# Patient Record
Sex: Female | Born: 1950
Health system: Southern US, Community
[De-identification: ages and names within clinical notes are randomized; demographics above are authoritative.]

## PROBLEM LIST (undated history)

## (undated) DIAGNOSIS — Z923 Personal history of irradiation: Secondary | ICD-10-CM

## (undated) DIAGNOSIS — K5792 Diverticulitis of intestine, part unspecified, without perforation or abscess without bleeding: Secondary | ICD-10-CM

## (undated) DIAGNOSIS — G4730 Sleep apnea, unspecified: Secondary | ICD-10-CM

## (undated) DIAGNOSIS — E213 Hyperparathyroidism, unspecified: Secondary | ICD-10-CM

## (undated) DIAGNOSIS — Z800 Family history of malignant neoplasm of digestive organs: Secondary | ICD-10-CM

## (undated) DIAGNOSIS — E785 Hyperlipidemia, unspecified: Secondary | ICD-10-CM

## (undated) DIAGNOSIS — C4492 Squamous cell carcinoma of skin, unspecified: Secondary | ICD-10-CM

## (undated) DIAGNOSIS — I10 Essential (primary) hypertension: Secondary | ICD-10-CM

## (undated) DIAGNOSIS — R7303 Prediabetes: Secondary | ICD-10-CM

## (undated) DIAGNOSIS — Z8489 Family history of other specified conditions: Secondary | ICD-10-CM

## (undated) DIAGNOSIS — M2550 Pain in unspecified joint: Secondary | ICD-10-CM

## (undated) DIAGNOSIS — K5900 Constipation, unspecified: Secondary | ICD-10-CM

## (undated) DIAGNOSIS — R002 Palpitations: Secondary | ICD-10-CM

## (undated) DIAGNOSIS — Z8052 Family history of malignant neoplasm of bladder: Secondary | ICD-10-CM

## (undated) DIAGNOSIS — J45909 Unspecified asthma, uncomplicated: Secondary | ICD-10-CM

## (undated) DIAGNOSIS — C50919 Malignant neoplasm of unspecified site of unspecified female breast: Secondary | ICD-10-CM

## (undated) DIAGNOSIS — T884XXA Failed or difficult intubation, initial encounter: Secondary | ICD-10-CM

## (undated) DIAGNOSIS — E079 Disorder of thyroid, unspecified: Secondary | ICD-10-CM

## (undated) DIAGNOSIS — K635 Polyp of colon: Secondary | ICD-10-CM

## (undated) DIAGNOSIS — M549 Dorsalgia, unspecified: Secondary | ICD-10-CM

## (undated) DIAGNOSIS — E559 Vitamin D deficiency, unspecified: Secondary | ICD-10-CM

## (undated) DIAGNOSIS — Z87891 Personal history of nicotine dependence: Secondary | ICD-10-CM

## (undated) DIAGNOSIS — Z803 Family history of malignant neoplasm of breast: Secondary | ICD-10-CM

## (undated) DIAGNOSIS — M7989 Other specified soft tissue disorders: Secondary | ICD-10-CM

## (undated) DIAGNOSIS — E538 Deficiency of other specified B group vitamins: Secondary | ICD-10-CM

## (undated) DIAGNOSIS — M1990 Unspecified osteoarthritis, unspecified site: Secondary | ICD-10-CM

## (undated) DIAGNOSIS — F1010 Alcohol abuse, uncomplicated: Secondary | ICD-10-CM

## (undated) DIAGNOSIS — Z808 Family history of malignant neoplasm of other organs or systems: Secondary | ICD-10-CM

## (undated) DIAGNOSIS — F329 Major depressive disorder, single episode, unspecified: Secondary | ICD-10-CM

## (undated) DIAGNOSIS — E042 Nontoxic multinodular goiter: Secondary | ICD-10-CM

## (undated) DIAGNOSIS — Z872 Personal history of diseases of the skin and subcutaneous tissue: Secondary | ICD-10-CM

## (undated) DIAGNOSIS — R0602 Shortness of breath: Secondary | ICD-10-CM

## (undated) DIAGNOSIS — E119 Type 2 diabetes mellitus without complications: Secondary | ICD-10-CM

## (undated) DIAGNOSIS — F32A Depression, unspecified: Secondary | ICD-10-CM

## (undated) DIAGNOSIS — F40240 Claustrophobia: Secondary | ICD-10-CM

## (undated) DIAGNOSIS — C4491 Basal cell carcinoma of skin, unspecified: Secondary | ICD-10-CM

## (undated) DIAGNOSIS — J449 Chronic obstructive pulmonary disease, unspecified: Secondary | ICD-10-CM

## (undated) DIAGNOSIS — F419 Anxiety disorder, unspecified: Secondary | ICD-10-CM

## (undated) HISTORY — PX: TONSILLECTOMY AND ADENOIDECTOMY: SUR1326

## (undated) HISTORY — DX: Sleep apnea, unspecified: G47.30

## (undated) HISTORY — DX: Major depressive disorder, single episode, unspecified: F32.9

## (undated) HISTORY — DX: Family history of malignant neoplasm of digestive organs: Z80.0

## (undated) HISTORY — PX: CHOLECYSTECTOMY: SHX55

## (undated) HISTORY — DX: Shortness of breath: R06.02

## (undated) HISTORY — DX: Palpitations: R00.2

## (undated) HISTORY — DX: Family history of malignant neoplasm of other organs or systems: Z80.8

## (undated) HISTORY — DX: Family history of malignant neoplasm of bladder: Z80.52

## (undated) HISTORY — DX: Personal history of irradiation: Z92.3

## (undated) HISTORY — DX: Basal cell carcinoma of skin, unspecified: C44.91

## (undated) HISTORY — PX: CARPAL TUNNEL RELEASE: SHX101

## (undated) HISTORY — DX: Personal history of diseases of the skin and subcutaneous tissue: Z87.2

## (undated) HISTORY — DX: Hyperparathyroidism, unspecified: E21.3

## (undated) HISTORY — DX: Malignant neoplasm of unspecified site of unspecified female breast: C50.919

## (undated) HISTORY — DX: Squamous cell carcinoma of skin, unspecified: C44.92

## (undated) HISTORY — DX: Family history of malignant neoplasm of breast: Z80.3

## (undated) HISTORY — DX: Polyp of colon: K63.5

## (undated) HISTORY — DX: Other specified soft tissue disorders: M79.89

## (undated) HISTORY — DX: Disorder of thyroid, unspecified: E07.9

## (undated) HISTORY — DX: Dorsalgia, unspecified: M54.9

## (undated) HISTORY — DX: Deficiency of other specified B group vitamins: E53.8

## (undated) HISTORY — DX: Depression, unspecified: F32.A

## (undated) HISTORY — DX: Constipation, unspecified: K59.00

## (undated) HISTORY — DX: Pain in unspecified joint: M25.50

## (undated) HISTORY — PX: TUBAL LIGATION: SHX77

## (undated) HISTORY — DX: Vitamin D deficiency, unspecified: E55.9

## (undated) HISTORY — DX: Alcohol abuse, uncomplicated: F10.10

---

## 2000-05-18 ENCOUNTER — Encounter (INDEPENDENT_AMBULATORY_CARE_PROVIDER_SITE_OTHER): Payer: Self-pay

## 2000-05-18 ENCOUNTER — Other Ambulatory Visit: Admission: RE | Admit: 2000-05-18 | Discharge: 2000-05-18 | Payer: Self-pay | Admitting: *Deleted

## 2001-03-27 ENCOUNTER — Other Ambulatory Visit: Admission: RE | Admit: 2001-03-27 | Discharge: 2001-03-27 | Payer: Self-pay | Admitting: Obstetrics and Gynecology

## 2001-05-25 ENCOUNTER — Encounter: Payer: Self-pay | Admitting: Urology

## 2001-05-25 ENCOUNTER — Encounter: Admission: RE | Admit: 2001-05-25 | Discharge: 2001-05-25 | Payer: Self-pay | Admitting: Urology

## 2002-04-16 ENCOUNTER — Other Ambulatory Visit: Admission: RE | Admit: 2002-04-16 | Discharge: 2002-04-16 | Payer: Self-pay | Admitting: Obstetrics and Gynecology

## 2002-05-18 ENCOUNTER — Encounter (INDEPENDENT_AMBULATORY_CARE_PROVIDER_SITE_OTHER): Payer: Self-pay

## 2002-05-18 ENCOUNTER — Ambulatory Visit (HOSPITAL_COMMUNITY): Admission: RE | Admit: 2002-05-18 | Discharge: 2002-05-18 | Payer: Self-pay | Admitting: Obstetrics and Gynecology

## 2003-05-29 ENCOUNTER — Encounter
Admission: RE | Admit: 2003-05-29 | Discharge: 2003-05-29 | Payer: Self-pay | Admitting: Physical Medicine and Rehabilitation

## 2003-07-24 ENCOUNTER — Other Ambulatory Visit: Admission: RE | Admit: 2003-07-24 | Discharge: 2003-07-24 | Payer: Self-pay | Admitting: Obstetrics and Gynecology

## 2003-12-26 ENCOUNTER — Encounter: Admission: RE | Admit: 2003-12-26 | Discharge: 2003-12-26 | Payer: Self-pay | Admitting: Internal Medicine

## 2004-01-06 ENCOUNTER — Encounter (HOSPITAL_COMMUNITY): Admission: RE | Admit: 2004-01-06 | Discharge: 2004-02-27 | Payer: Self-pay | Admitting: Internal Medicine

## 2004-06-29 ENCOUNTER — Ambulatory Visit (HOSPITAL_COMMUNITY): Admission: RE | Admit: 2004-06-29 | Discharge: 2004-06-29 | Payer: Self-pay | Admitting: Endocrinology

## 2005-03-11 ENCOUNTER — Ambulatory Visit (HOSPITAL_COMMUNITY): Admission: RE | Admit: 2005-03-11 | Discharge: 2005-03-11 | Payer: Self-pay | Admitting: Endocrinology

## 2005-03-22 ENCOUNTER — Encounter (INDEPENDENT_AMBULATORY_CARE_PROVIDER_SITE_OTHER): Payer: Self-pay | Admitting: *Deleted

## 2005-03-22 ENCOUNTER — Encounter: Admission: RE | Admit: 2005-03-22 | Discharge: 2005-03-22 | Payer: Self-pay | Admitting: Endocrinology

## 2005-03-22 ENCOUNTER — Other Ambulatory Visit: Admission: RE | Admit: 2005-03-22 | Discharge: 2005-03-22 | Payer: Self-pay | Admitting: Interventional Radiology

## 2005-08-13 ENCOUNTER — Encounter: Admission: RE | Admit: 2005-08-13 | Discharge: 2005-08-13 | Payer: Self-pay | Admitting: Endocrinology

## 2005-12-02 ENCOUNTER — Ambulatory Visit: Payer: Self-pay | Admitting: Internal Medicine

## 2007-01-20 ENCOUNTER — Encounter: Admission: RE | Admit: 2007-01-20 | Discharge: 2007-01-20 | Payer: Self-pay | Admitting: Endocrinology

## 2007-03-06 ENCOUNTER — Ambulatory Visit: Payer: Self-pay | Admitting: Vascular Surgery

## 2007-11-13 ENCOUNTER — Other Ambulatory Visit: Admission: RE | Admit: 2007-11-13 | Discharge: 2007-11-13 | Payer: Self-pay | Admitting: Family Medicine

## 2008-05-20 ENCOUNTER — Encounter: Admission: RE | Admit: 2008-05-20 | Discharge: 2008-05-20 | Payer: Self-pay | Admitting: Internal Medicine

## 2009-01-01 ENCOUNTER — Other Ambulatory Visit: Admission: RE | Admit: 2009-01-01 | Discharge: 2009-01-01 | Payer: Self-pay | Admitting: Family Medicine

## 2010-01-12 ENCOUNTER — Other Ambulatory Visit
Admission: RE | Admit: 2010-01-12 | Discharge: 2010-01-12 | Payer: Self-pay | Source: Home / Self Care | Admitting: Family Medicine

## 2010-03-21 ENCOUNTER — Encounter: Payer: Self-pay | Admitting: Internal Medicine

## 2010-03-21 ENCOUNTER — Encounter: Payer: Self-pay | Admitting: Endocrinology

## 2010-03-22 ENCOUNTER — Encounter: Payer: Self-pay | Admitting: Endocrinology

## 2010-07-14 NOTE — Consult Note (Signed)
VASCULAR SURGERY CONSULTATION   Tammy, Mccoy  DOB:  1950-03-23                                       03/06/2007  EAVWU#:[id number]   This is a vascular surgery consultation.   The patient is a 60 year old female patient who was referred through the  courtesy of Dr. Aram Beecham white, for consultation regarding venous  insufficiency of the left lower extremity.  She states that she has been  having increasing prominent varicosities in the left leg, particularly  in the lower thigh and knee area in the pretibial region over the last  10 years, and particularly over the last few years, has had increasing  aching, throbbing, and burning discomfort in the left leg.  This is made  worse by walking, and is partially relieved by bedrest and elevation.  She has noticed no swelling or ulceration, and has had no history of  thrombophlebitis or deep venous thrombosis.  She has had no bleeding  from the varicosities.  She has not worn elastic compression stockings,  but does have improvement with elevation as noted.  She is allergic to  ibuprofen, so has not tried that medication, but does take Tylenol which  improves her symptoms.  Currently, however, her daily is being effected  by these symptoms because of the ongoing discomfort.   PAST MEDICAL HISTORY:  Negative for diabetes, hypertension, coronary  artery disease, COPD and stroke.   PREVIOUS SURGERY:  Cesarean section.   FAMILY HISTORY:  Positive for diabetes in a grandmother.  Negative  coronary artery disease and stroke.   SOCIAL HISTORY:  She is married and has 2 children.  She does smoke a  pack of cigarettes per day.  Does not use alcohol on a regular basis.   REVIEW OF SYSTEMS/MEDICATIONS:  Please see health history form.   ALLERGIES:  Ibuprofen.   PHYSICAL EXAMINATION:  Blood pressure is 124/80, heart rate is 81,  respirations are 18.  Generally, she is a middle-age obese female who is  in no apparent  distress.  She is alert and oriented x3.  Her neck is  supple.  3+ carotid pulse is palpable.  No bruits are audible.  Her  neurologic exam is normal.  No palpable adenopathy in the neck.  CHEST:  Clear to auscultation.  CARDIOVASCULAR EXAM:  Reveals a regular rhythm  with no murmurs.  No skin rashes are noted.  ABDOMEN:  Soft.  No  palpable mass is noted.  LOWER EXTREMITY EXAM:  Reveals 3+ femoral  popliteal and dorsalis pedis pulses palpable bilaterally.  Right leg is  unremarkable with no significant varicosities, and no distal edema.  Left leg has prominent varicosities in the greater saphenous system,  beginning in the mid thigh and extending down medially to the knee and  just below the knee, and extending over toward the patella.  There are  also moderate varicosities in the left pretibial region.  There are no  posterior varicosities.  There is no hyperpigmentation or ulceration,  and no distal edema.   Venous duplex exam was performed in our office today, which revealed  gross reflux of the left saphenofemoral junction, and throughout the  left greater saphenous vein with the large branch supplying these  varicosities in the mid to distal thigh.  The venous system is widely  patent with no obstruction.   IMPRESSION:  Severe venous insufficiency of the left leg with  symptomatic varicose veins in the left greater saphenous system.   PLAN:  We will begin the patient with elastic compression support today  (20-mm to 30-mm long leg).  She will also try elevation on a more  regular basis, as well as analgesics and weight loss to see if this will  relieve her symptoms.  The varicosities were photographed today.  She  will return in 3 months for further followup, and if no improvement has  occurred, she would be a good candidate for laser ablation of the left  greater saphenous vein with multiple stab phlebectomies.   Tammy Mccoy, M.D.  Electronically Signed  JDL/MEDQ  D:   03/06/2007  T:  03/07/2007  Job:  [id number]   cc:   Stacie Acres. Cliffton Asters, M.D.

## 2010-07-14 NOTE — Procedures (Signed)
LOWER EXTREMITY VENOUS REFLUX EXAM   INDICATION:  Left leg pain and varicose vein.   EXAM:  Using color-flow imaging and pulse Doppler spectral analysis, the  left common femoral vein, superficial femoral, popliteal, posterior  tibial, greater and lesser saphenous veins are evaluated.  There is  evidence suggesting deep venous insufficiency in the left lower  extremity.   The left saphenofemoral junction is not competent.  The left GSV is not  competent with the caliber as described below.   The left proximal short saphenous vein demonstrates competency.   GSV Diameter (used if found to be incompetent only)                                            Right    Left  Proximal Greater Saphenous Vein           cm       0.99 cm  Proximal-to-mid-thigh                     cm       0.99 cm  Mid thigh                                 cm       0.68 cm  Mid-distal thigh                          cm       0.68 cm  Distal thigh                              cm       0.37 cm  Knee                                      cm       0.35 cm   IMPRESSION:  1. Left greater saphenous vein reflux is identified with the caliber      ranging from 0.35 cm to 0.99 cm knee to groin.  2. The left greater saphenous vein is not aneurysmal.  3. The left greater saphenous vein is not tortuous.  4. The deep venous system is not competent.  5. The left lesser saphenous vein is competent.  6. No evidence of deep venous thrombosis noted in the left leg.  7. A perforator measured (0.59) noted at the distal calf.   ___________________________________________  Quita Skye. Hart Rochester, M.D.   MG/MEDQ  D:  03/06/2007  T:  03/07/2007  Job:  [id number]

## 2010-07-17 NOTE — Op Note (Signed)
NAME:  Tammy Mccoy, Tammy Mccoy                    ACCOUNT NO.:  [id number]   MEDICAL RECORD NO.:  [id number]                   PATIENT TYPE:  AMB   LOCATION:  SDC                                  FACILITY:  [facility]   PHYSICIAN:  Maxie Better, M.D.            DATE OF BIRTH:  Jul 08, 1950   DATE OF PROCEDURE:  05/18/2002  DATE OF DISCHARGE:                                 OPERATIVE REPORT   PREOPERATIVE DIAGNOSES:  1. Menorrhagia.  2. Endometrial mass.   POSTOPERATIVE DIAGNOSES:  1. Menorrhagia.  2. Endometrial mass.   PROCEDURE:  Hysteroscopy, resectoscope dilation and curettage.   SURGEON:  Maxie Better, M.D.   ANESTHESIA:  General, paracervical block.   INDICATIONS:  This is a 60 year old gravida 2, para 2 female with a history  of bilateral tubal ligation who was found on evaluation of menorrhagia for  possible endometrial masses and who now presents for surgical management.  Risks and benefits of the procedure have been explained to the patient.  Consent was signed.  The patient was transferred to the operating room.   DESCRIPTION OF PROCEDURE:  Under adequate general anesthesia, the patient  was placed in the dorsal lithotomy position.  Examination under anesthesia  revealed axial to anteverted uterus, top normal size.  No adnexal masses  could be appreciated.  The patient was sterilely prepped and draped in the  usual fashion.  The bladder was catheterized of a small amount of urine.  Bivalve speculum was placed in the vagina.  A single-tooth tenaculum was  placed on the anterior lip of the cervix.  The cervix was initially dilated  up to #25 [facility] dilator.  A diagnostic hysteroscope was then introduced into  the uterine cavity.  Both tubal ostia were eventually seen.  There was  thickening of the posterior endometrial wall, however, no specific defined  endometrial polyp could be seen.  The endocervical canal was inspected, and  the other than suggestion of a  Nabothian cyst, no other lesions were noted.  The diagnostic hysteroscope was removed.  The cervix was then further  dilated up to a #31 [employer] dilator and a resectoscope was introduced.  Again,  the cavity was noted to have both tubal ostia.  There was blood in the  cavity.  The resectoscope was removed.  The cavity was then curetted for a  moderate amount of tissue.  The resectoscope was then reinserted.  A small  raised area was removed suggestive of possible polyp.  No other findings  were noted at which time the resectoscope was removed.  The cavity was once  again curetted.  Due to the fact that the patient has an allergy to  ibuprofen, 20 mL of 1% Nesacaine was injected paracervically and in the  uterosacral ligaments for pain management.  All instruments were then  removed from the vagina.  Specimen labeled endometrial curetting with  question of polyp was sent to pathology.  Estimated blood loss  was minimal.  Fluid deficits were about 200 mL.  Complications were none.  The patient  tolerated the procedure well and was transferred to the recovery room in  stable condition.                                               Maxie Better, M.D.    [employer]/MEDQ  D:  05/18/2002  T:  05/19/2002  Job:  [id number]

## 2010-07-17 NOTE — Assessment & Plan Note (Signed)
[facility]                               PULMONARY OFFICE NOTE   Tammy Mccoy, PAGANELLI            MRN:          [id number]  DATE:12/02/2005                            DOB:          January 25, 1951    SLEEP MEDICINE CONSULTATION   PROBLEM:  [age]-year-old woman seen at the kind request of Dr. Laurann Montana in sleep medicine consultation for sleep-disordered breathing and  excessive daytime sleepiness.   HISTORY:  Her husband has been telling her that she snores loudly and holds  her breath at night.  She falls asleep easily, but wakes in the morning  feeling completely unrested.  She may move or kick a little.  She had a  diagnostic polysomnogram at [facility] on Jul 07, 2005, which recorded mild obstructive apnea with an AHI of 10 per hour,  desaturating to 79% with loud snoring.  She was unable to tolerate an  attempt at split protocol, apparently because of claustrophobia.  A CPAP  titration study was done at the [facility] on August 20, 2005,  with incomplete control at 10 CWP, which was the highest pressure tried.  A  technician note indicates that she only had REM-related events.  She had had  an auto-titration attempt between May 23 and August 22, 2005, suggesting a  target therapeutic pressure of 13 to 14 CWP.  She had been told by Dr. Welton Flakes  that she needed oxygen added to CPAP at 2 liters per minute.  She says sleep  latency is only a few minutes, and she is  aware of waking 2 to 4 times  briefly during the night.  She gets up at 6:30 a.m. and limits caffeine to  morning coffee.  She has been working with sleep med home therapy, has tried  at least 2 different masks in addition to a nasal pillow system.   REVIEW OF SYSTEMS:  She denies seasonal nasal congestion problems, cough,  wheeze or dysphagia. Easy exertional dyspnea she attributes to  deconditioning.  No chest pain or palpitation, fevers or  sweats, confusion  or syncope, ankle edema or leg pain.   MEDICATIONS:  1. Prozac 20 mg.  2. Xanax 0.5 mg p.r.n.  3. Prilosec.  4. CPAP currently at 10 CWP.   ALLERGIES:  Drug intolerance of IBUPROFEN.   PAST HISTORY:  1. Tonsils and adenoids were out as a child.  2. Frequent ear problems in childhood.  3. Cervical disk disease with some spurs.  4. Small thyroid nodules, are being followed.  5. She has not been told of any heart or lung disease.  6. Surgery otherwise for tubal ligation.  7. Two C-sections.   SOCIAL HISTORY:  She is smoking one pack per day, having stopped and then  restarted a year ago.  Her weight went up 30 pounds when she quit the first  time, and she has now joined [employer].  Reports significant social  stresses at home.  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   FAMILY HISTORY:  Mother with emphysema.  Mother, father and sister with  cancers.  OBJECTIVE:  Weight 266 pounds, BP 116/66, pulse regular 84, room air  saturation 95%.  This is an obese, apparently comfortable and pleasant lady.  SKIN:  Clear.  ADENOPATHY:  None at the neck or shoulders.  HEENT:  Thick tongue base and long palate for a spacing 4/4.  Voice quality  is normal with no stridor or thyromegaly, and no neck vein distention.  She  seems able to breathe through her nose with her mouth closed.  CHEST:  Shallow but quiet and clear breath sounds, unlabored at rest.  Heart sounds regular, somewhat muffled, without murmur or gallop.  EXTREMITIES:  Without cyanosis, clubbing, edema, tremor or sweating.   Pulmonary function tests:  PFT dated October 22, 2005, indicates moderate  obstructive airways disease with an FVC of 3.04 (91%), FEV1 of 1.79 (66%),  ratio 0.59.  FEV1 improved 34% to 2.40 (88%) after bronchodilator, which is  a significant response.  Measured lung volumes were normal, with a total  lung capacity of 89%, and diffusion capacity was within normal at  88%.  The  flow volume loop looks significantly boxed on the 2 tracings available,  raising the possibility of a fixed upper airway obstruction.   IMPRESSION:  1. Obstructive sleep apnea, mild, apnea-hypopnea index 10 per hour, with      continuous positive airway pressure intolerance at 10 centimeters of      water pressure, which she says is way too much, blowing her head      open.  2. 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  3. Tobacco abuse.  4. Question possibility of upper airway obstruction, based on limited      numbers of flow volume loops, without stridor.  5. Exogenous obesity.  6. She has home oxygen at 2 liters per minute provided through [employer].  I      am not sure I see sufficient basis in available data to require this on      a long-term basis.   PLAN:  1. Empiric trial of a reduced CPAP to 5 CWP.  She will call to report how      this feels for her.  2. Strongly supported her participation with [employer].  3. Smoking cessation, discussion and prescription of Chantix.  4. Recheck pulmonary function tests, with particular attention to flow      volume loop contour, and based on that we will consider whether she      needs a CT scan.  5. Schedule return in 3 to 4 weeks, with decision then about continuing      home oxygen and      about whether a different assessment of her sleep complaints will be      necessary.  I appreciate the chance to meet her, and would be happy to      discuss her care.       Clinton D. Maple Hudson, MD, FCCP, FACP      CDY/MedQ  DD:  12/02/2005  DT:  12/04/2005  Job #:  [id number]   cc:   Stacie Acres. Cliffton Asters, M.D.  Dorisann Frames, M.D.

## 2010-12-15 ENCOUNTER — Other Ambulatory Visit: Payer: Self-pay | Admitting: Internal Medicine

## 2010-12-15 DIAGNOSIS — E041 Nontoxic single thyroid nodule: Secondary | ICD-10-CM

## 2010-12-23 ENCOUNTER — Other Ambulatory Visit: Payer: Self-pay

## 2010-12-24 ENCOUNTER — Ambulatory Visit
Admission: RE | Admit: 2010-12-24 | Discharge: 2010-12-24 | Disposition: A | Discharge status: Home / Self Care | Payer: PRIVATE HEALTH INSURANCE | Source: Ambulatory Visit | Attending: Internal Medicine | Admitting: Internal Medicine

## 2010-12-24 DIAGNOSIS — E041 Nontoxic single thyroid nodule: Secondary | ICD-10-CM

## 2011-06-09 DIAGNOSIS — E669 Obesity, unspecified: Secondary | ICD-10-CM

## 2011-06-23 DIAGNOSIS — E669 Obesity, unspecified: Secondary | ICD-10-CM

## 2011-06-30 DIAGNOSIS — E669 Obesity, unspecified: Secondary | ICD-10-CM

## 2011-07-07 DIAGNOSIS — E669 Obesity, unspecified: Secondary | ICD-10-CM

## 2011-07-28 DIAGNOSIS — E669 Obesity, unspecified: Secondary | ICD-10-CM

## 2011-08-25 DIAGNOSIS — E669 Obesity, unspecified: Secondary | ICD-10-CM

## 2012-12-19 ENCOUNTER — Other Ambulatory Visit: Payer: Self-pay | Admitting: Family Medicine

## 2012-12-19 ENCOUNTER — Other Ambulatory Visit (HOSPITAL_COMMUNITY)
Admission: RE | Admit: 2012-12-19 | Discharge: 2012-12-19 | Disposition: A | Discharge status: Home / Self Care | Payer: BC Managed Care – PPO | Source: Ambulatory Visit | Attending: Family Medicine | Admitting: Family Medicine

## 2012-12-19 DIAGNOSIS — Z0000 Encounter for general adult medical examination without abnormal findings: Secondary | ICD-10-CM | POA: Insufficient documentation

## 2013-01-02 ENCOUNTER — Other Ambulatory Visit: Payer: Self-pay | Admitting: Internal Medicine

## 2013-01-02 DIAGNOSIS — E049 Nontoxic goiter, unspecified: Secondary | ICD-10-CM

## 2013-01-04 ENCOUNTER — Ambulatory Visit
Admission: RE | Admit: 2013-01-04 | Discharge: 2013-01-04 | Disposition: A | Discharge status: Home / Self Care | Payer: BC Managed Care – PPO | Source: Ambulatory Visit | Attending: Internal Medicine | Admitting: Internal Medicine

## 2013-01-04 DIAGNOSIS — E049 Nontoxic goiter, unspecified: Secondary | ICD-10-CM

## 2013-04-10 ENCOUNTER — Encounter: Payer: Self-pay | Admitting: Cardiology

## 2013-04-10 ENCOUNTER — Ambulatory Visit (INDEPENDENT_AMBULATORY_CARE_PROVIDER_SITE_OTHER): Payer: BC Managed Care – PPO | Admitting: Cardiology

## 2013-04-10 VITALS — BP 140/72 | HR 61 | Ht 65.0 in | Wt 280.8 lb

## 2013-04-10 DIAGNOSIS — R079 Chest pain, unspecified: Secondary | ICD-10-CM

## 2013-04-10 NOTE — Patient Instructions (Signed)
Your physician recommends that you schedule a follow-up appointment in: as needed Your physician has requested that you have an exercise tolerance test. For further information please visit [email or web address]. Please also follow instruction sheet, as given.   

## 2013-04-10 NOTE — Progress Notes (Signed)
HPI The patient presents for evaluation of dyspnea. She has no prior cardiac history. However, she's noticed shortness of breath with some discomfort. She had a sinus problem followed by some soreness in her breastbone. She had a mild cough with this. She described this to Dr. Dema Severin who referred her for further evaluation.  She does get some dyspnea walking a flight of stairs but this resolves fairly quickly. She does not report chest discomfort with this activity. She does not get neck or arm discomfort. She doesn't have any palpitations, presyncope or syncope. She has no PND or orthopnea. She has no new edema.  Allergies  Allergen Reactions  . Ibuprofen     Current Outpatient Prescriptions  Medication Sig Dispense Refill  . ALPRAZolam (XANAX) 0.5 MG tablet Take 0.5 mg by mouth. 1 to 1/2 tablet  At bedtime prn      . amoxicillin (AMOXIL) 500 MG capsule Take 500 mg by mouth. 2 tablets every 12 hours      . DULoxetine (CYMBALTA) 30 MG capsule Take 30 mg by mouth daily.      . fluticasone (FLONASE) 50 MCG/ACT nasal spray Place 2 sprays into both nostrils daily.      . naproxen (NAPROSYN) 500 MG tablet Take 500 mg by mouth 3 times daily with meals, bedtime and 2 AM. As needed      . triamcinolone cream (KENALOG) 0.1 % Apply 1 application topically. Use sparingly to affected area as needed      . Vitamin D, Ergocalciferol, (DRISDOL) 50000 UNITS CAPS capsule Take 50,000 Units by mouth 2 (two) times a week.       No current facility-administered medications for this visit.    Past Medical History  Diagnosis Date  . Sleep apnea   . Depression   . Skin cancer, basal cell   . Squamous cell skin cancer   . H/O rosacea     Past Surgical History  Procedure Laterality Date  . Cesarean section    . Carpal tunnel release      Bilateral  . Tonsillectomy and adenoidectomy    . Tubal ligation      Family History  Problem Relation Age of Onset  . Cancer Father     Bladder  . CVA Mother 86     History   Social History  . Marital Status: Married    Spouse Name: N/A    Number of Children: 2  . Years of Education: N/A   Occupational History  . 2    Social History Main Topics  . Smoking status: Former Smoker    Types: Cigarettes  . Smokeless tobacco: Former Systems developer    Quit date: 03/01/2008  . Alcohol Use: Not on file  . Drug Use: Not on file  . Sexual Activity: Not on file   Other Topics Concern  . Not on file   Social History Narrative   Used to drink alcohol.  None in the last 10 years.   Son lives with her.    She has two grands.  She helps take care of them.     ROS:  Positive or reflux, edema, rhinitis  Otherwise as stated in the HPI and negative for all other systems.  PHYSICAL EXAM BP 140/72  Pulse 61  Ht 5\' 5"  (1.651 m)  Wt 280 lb 12.8 oz (127.37 kg)  BMI 46.73 kg/m2 GENERAL:  Well appearing HEENT:  Pupils equal round and reactive, fundi not visualized, oral mucosa unremarkable NECK:  No jugular venous distention, waveform within normal limits, carotid upstroke brisk and symmetric, no bruits, no thyromegaly LYMPHATICS:  No cervical, inguinal adenopathy LUNGS:  Clear to auscultation bilaterally BACK:  No CVA tenderness CHEST:  Unremarkable HEART:  PMI not displaced or sustained,S1 and S2 within normal limits, no S3, no S4, no clicks, no rubs, brief systolic murmur at the right upper sternal border, no diastolic murmurs ABD:  Flat, positive bowel sounds normal in frequency in pitch, no bruits, no rebound, no guarding, no midline pulsatile mass, no hepatomegaly, no splenomegaly EXT:  2 plus pulses throughout, no edema, no cyanosis no clubbing SKIN:  No rashes no nodules NEURO:  Cranial nerves II through XII grossly intact, motor grossly intact throughout PSYCH:  Cognitively intact, oriented to person place and time   EKG:  Sinus rhythm, rate 97, right bundle branch block incomplete, no acute ST-T wave changes.  04/10/2013   ASSESSMENT AND  PLAN  DYSPNEA:  I suspect that this is multifactorial. However, given this complaint stress testing is indicated. I will bring the patient back for a POET (Plain Old Exercise Test). This will allow me to screen for obstructive coronary disease, risk stratify and very importantly provide a prescription for exercise.  OBESITY:  The patient understands the need to lose weight with diet and exercise. We have discussed specific strategies for this.  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 This can be followed clinically.

## 2013-04-25 ENCOUNTER — Encounter (HOSPITAL_COMMUNITY): Payer: BC Managed Care – PPO

## 2013-05-08 ENCOUNTER — Ambulatory Visit (HOSPITAL_COMMUNITY)
Admission: RE | Admit: 2013-05-08 | Discharge: 2013-05-08 | Disposition: A | Discharge status: Home / Self Care | Payer: BC Managed Care – PPO | Source: Ambulatory Visit | Attending: Cardiovascular Disease | Admitting: Cardiovascular Disease

## 2013-05-08 DIAGNOSIS — R079 Chest pain, unspecified: Secondary | ICD-10-CM | POA: Insufficient documentation

## 2013-05-24 ENCOUNTER — Telehealth: Payer: Self-pay | Admitting: Cardiology

## 2013-05-24 NOTE — Telephone Encounter (Signed)
Pt called and would like her stress test results from 21/2 weeks ago please.

## 2013-05-24 NOTE — Telephone Encounter (Signed)
Pt is aware of stress test results Debbie Trentyn Boisclair RN  

## 2013-05-24 NOTE — Telephone Encounter (Signed)
Message forwarded to [employer].

## 2013-05-30 ENCOUNTER — Telehealth: Payer: Self-pay | Admitting: *Deleted

## 2013-05-30 NOTE — Telephone Encounter (Signed)
She should have a follow up with me because I think her HTN might be contributing to her dyspnea.   Pt aware and she has been scheduled for 06/12/2013

## 2013-06-12 ENCOUNTER — Ambulatory Visit: Payer: BC Managed Care – PPO | Admitting: Cardiology

## 2013-06-25 ENCOUNTER — Encounter: Payer: Self-pay | Admitting: *Deleted

## 2013-06-26 ENCOUNTER — Ambulatory Visit (INDEPENDENT_AMBULATORY_CARE_PROVIDER_SITE_OTHER): Payer: BC Managed Care – PPO | Admitting: Cardiology

## 2013-06-26 ENCOUNTER — Encounter: Payer: Self-pay | Admitting: Cardiology

## 2013-06-26 VITALS — BP 125/75 | HR 75 | Ht 65.5 in | Wt 287.3 lb

## 2013-06-26 DIAGNOSIS — G4730 Sleep apnea, unspecified: Secondary | ICD-10-CM

## 2013-06-26 MED ORDER — HYDROCHLOROTHIAZIDE 25 MG PO TABS: 12.5000 mg | ORAL_TABLET | Freq: Every day | ORAL | Status: DC

## 2013-06-26 NOTE — Progress Notes (Signed)
HPI The patient presents for evaluation of dyspnea. This is described in the previous note.  I sent her for a POET (Plain Old Exercise Treadmill).  There was no evidence for ischemia.  However, she had a hypertensive response.  Since I saw her she has done well.  The patient denies any new symptoms such as chest discomfort, neck or arm discomfort. There has been no new shortness of breath, PND or orthopnea. There have been no reported palpitations, presyncope or syncope.  She thinks that her breathing is better.    Allergies  Allergen Reactions  . Ibuprofen     Current Outpatient Prescriptions  Medication Sig Dispense Refill  . ALPRAZolam (XANAX) 0.5 MG tablet Take 0.5 mg by mouth. 1 to 1/2 tablet  At bedtime prn      . FLUoxetine (PROZAC) 10 MG capsule Take 3 capsules by mouth daily.      . fluticasone (FLONASE) 50 MCG/ACT nasal spray Place 2 sprays into both nostrils daily.      . naproxen (NAPROSYN) 500 MG tablet Take 500 mg by mouth 3 times daily with meals, bedtime and 2 AM. As needed      . triamcinolone cream (KENALOG) 0.1 % Apply 1 application topically. Use sparingly to affected area as needed      . Vitamin D, Ergocalciferol, (DRISDOL) 50000 UNITS CAPS capsule Take 50,000 Units by mouth 2 (two) times a week.       No current facility-administered medications for this visit.    Past Medical History  Diagnosis Date  . Sleep apnea   . Depression   . Skin cancer, basal cell   . Squamous cell skin cancer   . H/O rosacea     Past Surgical History  Procedure Laterality Date  . Cesarean section    . Carpal tunnel release      Bilateral  . Tonsillectomy and adenoidectomy    . Tubal ligation      ROS:  As stated in the HPI and negative for all other systems.  PHYSICAL EXAM BP 125/75  Pulse 75  Ht 5' 5.5" (1.664 m)  Wt 287 lb 4.8 oz (130.318 kg)  BMI 47.06 kg/m2 GENERAL:  Well appearing NECK:  No jugular venous distention, waveform within normal limits, carotid  upstroke brisk and symmetric, no bruits, no thyromegaly LUNGS:  Clear to auscultation bilaterally CHEST:  Unremarkable HEART:  PMI not displaced or sustained,S1 and S2 within normal limits, no S3, no S4, no clicks, no rubs, brief systolic murmur at the right upper sternal border, no diastolic murmurs ABD:  Flat, positive bowel sounds normal in frequency in pitch, no bruits, no rebound, no guarding, no midline pulsatile mass, no hepatomegaly, no splenomegaly EXT:  2 plus pulses throughout, trace edema, no cyanosis no clubbing   ASSESSMENT AND PLAN  DYSPNEA:  I suspect that this is multifactorial. We discussed the role of weight.  I think BP might play a role.  I will start a low dose of HCTZ.    OBESITY:  The patient understands the need to lose weight with diet and exercise. We have discussed specific strategies for this.  We discussed this at length and I think that this is contributing to her dyspnea significantly.  SLEEP APNEA:  She has had this diagnosis before.  I will send her for a sleep study and an appointment with a sleep MD.   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 This can be followed clinically.  HTN:  As above.

## 2013-06-26 NOTE — Patient Instructions (Signed)
Please start Hydrochlorothiazide 25 mg 1/2 tablet a day. Continue all other medications as listed.  Your physician has recommended that you have a sleep study. This test records several body functions during sleep, including: brain activity, eye movement, oxygen and carbon dioxide blood levels, heart rate and rhythm, breathing rate and rhythm, the flow of air through your mouth and nose, snoring, body muscle movements, and chest and belly movement.  Follow up in 1 year with Dr Percival Spanish.  You will receive a letter in the mail 2 months before you are due.  Please call us when you receive this letter to schedule your follow up appointment.

## 2013-07-15 ENCOUNTER — Ambulatory Visit (HOSPITAL_BASED_OUTPATIENT_CLINIC_OR_DEPARTMENT_OTHER): Payer: BC Managed Care – PPO | Attending: Cardiology

## 2013-07-15 VITALS — Ht 65.0 in | Wt 287.0 lb

## 2013-07-15 DIAGNOSIS — G4730 Sleep apnea, unspecified: Secondary | ICD-10-CM

## 2013-07-15 DIAGNOSIS — G4733 Obstructive sleep apnea (adult) (pediatric): Secondary | ICD-10-CM | POA: Insufficient documentation

## 2013-07-26 DIAGNOSIS — G4730 Sleep apnea, unspecified: Secondary | ICD-10-CM

## 2013-07-26 DIAGNOSIS — G4710 Hypersomnia, unspecified: Secondary | ICD-10-CM

## 2013-07-26 NOTE — Sleep Study (Signed)
   NAME: Tammy Mccoy DATE OF BIRTH:  Feb 12, 1951 MEDICAL RECORD NUMBER [id number]  LOCATION: [facility]  PHYSICIAN: Armando Reichert Clance  DATE OF STUDY: 07/15/2013  SLEEP STUDY TYPE: Nocturnal Polysomnogram               REFERRING PHYSICIAN: Minus Breeding, MD  INDICATION FOR STUDY: Hypersomnia with sleep apnea  EPWORTH SLEEPINESS SCORE:  9 HEIGHT: 5\' 5"  (165.1 cm)  WEIGHT: 287 lb (130.182 kg)    Body mass index is 47.76 kg/(m^2).  NECK SIZE: 17 in.  MEDICATIONS: Reviewed in the sleep record  SLEEP ARCHITECTURE: The patient had a total sleep time of 216 minutes with no slow-wave sleep or REM noted. Sleep onset latency was prolonged at 130 minutes, and sleep efficiency was very poor at 51%.  RESPIRATORY DATA: The patient was found to have 1 apnea and 19 obstructive hypopneas, giving her an AHI of 6 events per hour. The events occurred in all body positions, and there was loud snoring noted throughout. She did not meet split-night protocol secondary to the small numbers of events.  OXYGEN DATA: There was oxygen desaturation as low as 84% with the patient's obstructive events  CARDIAC DATA: Rare PVC noted  MOVEMENT/PARASOMNIA: No significant limb movements or abnormal behaviors were noted.  IMPRESSION/ RECOMMENDATION:    1)  very mild obstructive sleep apnea/hypopnea syndrome, with an AHI of only 6 events per hour and transient oxygen desaturation as low as 84%. However, the patient never achieved REM during the study, and therefore her degree of sleep apnea may be underestimated. Clinical correlation is suggested. Treatment for this degree of sleep apnea can include a trial of weight loss alone, upper airway surgery, dental appliance, and also CPAP.  2)  rare PVC noted, but no clinically significant arrhythmias were seen.     [place], American Board of Sleep Medicine  ELECTRONICALLY SIGNED ON:  07/26/2013, 1:07 PM [place] PH: [phone]   FX: [phone] [place]

## 2013-08-03 ENCOUNTER — Telehealth: Payer: Self-pay | Admitting: *Deleted

## 2013-08-03 DIAGNOSIS — G4730 Sleep apnea, unspecified: Secondary | ICD-10-CM

## 2013-08-03 NOTE — Telephone Encounter (Signed)
Left message for pt to call back  °

## 2013-08-03 NOTE — Telephone Encounter (Signed)
Mild sleep apnea. I would suggest an appt with Dr. Gwenette Greet since she has had this diagnosis before.

## 2013-08-03 NOTE — Telephone Encounter (Signed)
Pt aware of results and need to be referred to Dr Gwenette Greet.  An order will be place for referral.

## 2013-08-03 NOTE — Telephone Encounter (Signed)
Follow up  ° ° ° °Returning call back to nurse  °

## 2013-08-03 NOTE — Telephone Encounter (Signed)
Follow Up    Pt returning call from earlier today. Requesting a call after 2 PM.

## 2013-08-06 ENCOUNTER — Encounter (HOSPITAL_BASED_OUTPATIENT_CLINIC_OR_DEPARTMENT_OTHER): Payer: BC Managed Care – PPO

## 2013-08-29 ENCOUNTER — Encounter (INDEPENDENT_AMBULATORY_CARE_PROVIDER_SITE_OTHER): Payer: Self-pay

## 2013-08-29 ENCOUNTER — Ambulatory Visit (INDEPENDENT_AMBULATORY_CARE_PROVIDER_SITE_OTHER): Payer: BC Managed Care – PPO | Admitting: Pulmonary Disease

## 2013-08-29 ENCOUNTER — Ambulatory Visit
Admission: RE | Admit: 2013-08-29 | Discharge: 2013-08-29 | Disposition: A | Discharge status: Home / Self Care | Payer: BC Managed Care – PPO | Source: Ambulatory Visit | Attending: Family Medicine | Admitting: Family Medicine

## 2013-08-29 ENCOUNTER — Encounter: Payer: Self-pay | Admitting: Pulmonary Disease

## 2013-08-29 ENCOUNTER — Other Ambulatory Visit: Payer: Self-pay | Admitting: Family Medicine

## 2013-08-29 VITALS — BP 120/70 | HR 75 | Temp 97.6°F | Ht 66.0 in | Wt 292.2 lb

## 2013-08-29 DIAGNOSIS — G4733 Obstructive sleep apnea (adult) (pediatric): Secondary | ICD-10-CM | POA: Insufficient documentation

## 2013-08-29 DIAGNOSIS — R0600 Dyspnea, unspecified: Secondary | ICD-10-CM

## 2013-08-29 NOTE — Assessment & Plan Note (Signed)
The patient has very mild obstructive sleep apnea, and it is unclear whether this has anything to do with some of her symptoms during the night or day. Certainly this is not a significant factor for her cardiovascular health, and the decision to treat this aggressively should be based upon its impact to her quality of life. I have outlined a conservative treatment path with a trial of weight loss over time, versus a more aggressive path with a trial of CPAP or a dental appliance. After a long discussion, the patient would like to try working on weight loss first and see how she responds. I have told her that I am happy to try her on a different mode of therapy if she continues to have issues.

## 2013-08-29 NOTE — Patient Instructions (Signed)
Work on weight loss, but let us know if you wish to try cpap.

## 2013-08-29 NOTE — Progress Notes (Signed)
Subjective:    Patient ID: Tammy Mccoy, female    DOB: 08-17-1950, 63 y.o.   MRN: [id number]  HPI The patient is a 63 year old female who I've been asked to see for management of very mild obstructive sleep apnea. She has had a recent sleep study that showed an AHI of only 6 events per hour. The patient has been noted to have snoring, and her late husband had also noticed apneas at times. The patient has had a sleep study in the distant past, but was unable to tolerate CPAP. She lost significant weight, and her symptoms greatly improved. Unfortunately, she has gained significant weight back, and her recent sleep study shows an AHI of only 6 events per hour. The patient denies any choking arousals, but does have frequent awakenings to go to the bathroom. She is not rested in the mornings upon arising, but only notes sleep pressure at times during the day. Overall, her alertness during the day is very acceptable. She has no issues in the evenings watching television or movies, and has no sleepiness with driving. The patient states that her weight is up over 50 pounds the last 2 years, and her Epworth score today is only 8.   Sleep Questionnaire What time do you typically go to bed?( Between what hours) 10:30-11:30pm 10:30-11:30pm at 1027 on 08/29/13 by Lilli Few, CMA How long does it take you to fall asleep? 5 minutes 5 minutes at 1027 on 08/29/13 by Lilli Few, CMA How many times during the night do you wake up? 3 3 at 1027 on 08/29/13 by Lilli Few, CMA What time do you get out of bed to start your day? 0730 0730 at 1027 on 08/29/13 by Lilli Few, CMA Do you drive or operate heavy machinery in your occupation? No No at 1027 on 08/29/13 by Lilli Few, CMA How much has your weight changed (up or down) over the past two years? (In pounds) 50 lb (22.68 kg) 50 lb (22.68 kg) at 1027 on 08/29/13 by Lilli Few, CMA Have you ever had a  sleep study before? Yes Yes at 1027 on 08/29/13 by Lilli Few, CMA If yes, location of study? [place] at 1027 on 08/29/13 by Lilli Few, CMA If yes, date of study? [id number] 06-2013 at 1027 on 08/29/13 by Lilli Few, CMA Do you currently use CPAP? No No at 1027 on 08/29/13 by Lilli Few, CMA Do you wear oxygen at any time? No No at 1027 on 08/29/13 by Lilli Few, CMA   Review of Systems  Constitutional: Negative for fever and unexpected weight change.  HENT: Negative for congestion, dental problem, ear pain, nosebleeds, postnasal drip, rhinorrhea, sinus pressure, sneezing, sore throat and trouble swallowing.   Eyes: Negative for redness and itching.  Respiratory: Positive for shortness of breath. Negative for cough, chest tightness and wheezing.   Cardiovascular: Negative for palpitations and leg swelling.  Gastrointestinal: Negative for nausea and vomiting.  Genitourinary: Negative for dysuria.  Musculoskeletal: Negative for joint swelling.  Skin: Negative for rash.  Neurological: Negative for headaches.  Hematological: Does not bruise/bleed easily.  Psychiatric/Behavioral: Negative for dysphoric mood. The patient is not nervous/anxious.        Objective:   Physical Exam Constitutional:  Well developed, no acute distress  HENT:  Nares patent without discharge  Oropharynx without exudate, palate and uvula are thick and elongated  Eyes:  Perrla, eomi, no scleral icterus  Neck:  No JVD, no TMG  Cardiovascular:  Normal rate, regular rhythm, no rubs or gallops.  No murmurs        Intact distal pulses  Pulmonary :  Normal breath sounds, no stridor or respiratory distress   No rales, rhonchi, or wheezing  Abdominal:  Soft, nondistended, bowel sounds present.  No tenderness noted.   Musculoskeletal:  Mild lower extremity edema noted.  Lymph Nodes:  No cervical lymphadenopathy noted  Skin:  No cyanosis  noted  Neurologic:  Alert, appropriate, moves all 4 extremities without obvious deficit.         Assessment & Plan:

## 2014-01-11 ENCOUNTER — Other Ambulatory Visit: Payer: Self-pay | Admitting: Internal Medicine

## 2014-01-11 DIAGNOSIS — E049 Nontoxic goiter, unspecified: Secondary | ICD-10-CM

## 2014-01-15 ENCOUNTER — Other Ambulatory Visit: Payer: BC Managed Care – PPO

## 2014-01-15 ENCOUNTER — Ambulatory Visit
Admission: RE | Admit: 2014-01-15 | Discharge: 2014-01-15 | Disposition: A | Discharge status: Home / Self Care | Payer: BC Managed Care – PPO | Source: Ambulatory Visit | Attending: Internal Medicine | Admitting: Internal Medicine

## 2014-01-15 DIAGNOSIS — E049 Nontoxic goiter, unspecified: Secondary | ICD-10-CM

## 2014-02-04 ENCOUNTER — Ambulatory Visit (INDEPENDENT_AMBULATORY_CARE_PROVIDER_SITE_OTHER): Payer: BC Managed Care – PPO

## 2014-02-04 ENCOUNTER — Ambulatory Visit (INDEPENDENT_AMBULATORY_CARE_PROVIDER_SITE_OTHER): Payer: BC Managed Care – PPO | Admitting: Podiatry

## 2014-02-04 ENCOUNTER — Encounter: Payer: Self-pay | Admitting: Podiatry

## 2014-02-04 VITALS — BP 121/65 | HR 93 | Resp 16

## 2014-02-04 DIAGNOSIS — M722 Plantar fascial fibromatosis: Secondary | ICD-10-CM

## 2014-02-04 MED ORDER — TRIAMCINOLONE ACETONIDE 10 MG/ML IJ SUSP
10.0000 mg | Freq: Once | INTRAMUSCULAR | Status: AC
  Administered 2014-02-04: 10 mg

## 2014-02-04 NOTE — Patient Instructions (Signed)

## 2014-02-05 NOTE — Progress Notes (Signed)
Subjective:     Patient ID: Tammy Mccoy, female   DOB: 06-09-1950, 63 y.o.   MRN: [id number]  HPI patient states she has started to develop intense discomfort in the plantar right heel of several months duration   Review of Systems     Objective:   Physical Exam  neurovascular status intact with muscle strength adequate range of motion within normal limits and pain in the plantar heel right at the insertional point of the tendon into the calcaneus    Assessment:      plantar fasciitis right of an acute nature    Plan:      H&P and x-rays reviewed and injected the plantar fascia right 3 mg Kenalog 5 mg Xylocaine and dispensed fascial brace with instructions on usage and also instructions on physical therapy and shoe gear usage

## 2014-02-13 ENCOUNTER — Ambulatory Visit: Payer: BC Managed Care – PPO | Admitting: Podiatry

## 2014-02-14 ENCOUNTER — Ambulatory Visit (INDEPENDENT_AMBULATORY_CARE_PROVIDER_SITE_OTHER): Payer: BC Managed Care – PPO | Admitting: Podiatry

## 2014-02-14 ENCOUNTER — Encounter: Payer: Self-pay | Admitting: Podiatry

## 2014-02-14 VITALS — BP 149/79 | HR 72 | Resp 15

## 2014-02-14 DIAGNOSIS — M722 Plantar fascial fibromatosis: Secondary | ICD-10-CM

## 2014-02-14 MED ORDER — TRIAMCINOLONE ACETONIDE 10 MG/ML IJ SUSP
10.0000 mg | Freq: Once | INTRAMUSCULAR | Status: AC
  Administered 2014-02-14: 10 mg

## 2014-02-14 NOTE — Patient Instructions (Signed)
Plantar Fasciitis  Plantar fasciitis is a common condition that causes foot pain. It is soreness (inflammation) of the band of tough fibrous tissue on the bottom of the foot that runs from the heel bone (calcaneus) to the ball of the foot. The cause of this soreness may be from excessive standing, poor fitting shoes, running on hard surfaces, being overweight, having an abnormal walk, or overuse (this is common in runners) of the painful foot or feet. It is also common in aerobic exercise dancers and ballet dancers.  SYMPTOMS   Most people with plantar fasciitis complain of:   Severe pain in the morning on the bottom of their foot especially when taking the first steps out of bed. This pain recedes after a few minutes of walking.   Severe pain is experienced also during walking following a long period of inactivity.   Pain is worse when walking barefoot or up stairs  DIAGNOSIS    Your caregiver will diagnose this condition by examining and feeling your foot.   Special tests such as X-rays of your foot, are usually not needed.  PREVENTION    Consult a sports medicine professional before beginning a new exercise program.   Walking programs offer a good workout. With walking there is a lower chance of overuse injuries common to runners. There is less impact and less jarring of the joints.   Begin all new exercise programs slowly. If problems or pain develop, decrease the amount of time or distance until you are at a comfortable level.   Wear good shoes and replace them regularly.   Stretch your foot and the heel cords at the back of the ankle (Achilles tendon) both before and after exercise.   Run or exercise on even surfaces that are not hard. For example, asphalt is better than pavement.   Do not run barefoot on hard surfaces.   If using a treadmill, vary the incline.   Do not continue to workout if you have foot or joint problems. Seek professional help if they do not improve.  HOME CARE INSTRUCTIONS     Avoid activities that cause you pain until you recover.   Use ice or cold packs on the problem or painful areas after working out.   Only take over-the-counter or prescription medicines for pain, discomfort, or fever as directed by your caregiver.   Soft shoe inserts or athletic shoes with air or gel sole cushions may be helpful.   If problems continue or become more severe, consult a sports medicine caregiver or your own health care provider. Cortisone is a potent anti-inflammatory medication that may be injected into the painful area. You can discuss this treatment with your caregiver.  MAKE SURE YOU:    Understand these instructions.   Will watch your condition.   Will get help right away if you are not doing well or get worse.  Document Released: 11/10/2000 Document Revised: 05/10/2011 Document Reviewed: 01/10/2008  ExitCare Patient Information 2015 [employer]. This information is not intended to replace advice given to you by your health care provider. Make sure you discuss any questions you have with your health care provider.

## 2014-02-14 NOTE — Progress Notes (Signed)
Subjective:     Patient ID: Tammy Mccoy, female   DOB: Nov 29, 1950, 63 y.o.   MRN: [id number]  HPI patient states my right heel is doing well but there still one spot that sore and making it hard for me to bear full weight   Review of Systems     Objective:   Physical Exam Neurovascular status intact with inflammation still noted upon palpation to the medial band right plantar fascia    Assessment:     Plantar fasciitis right still present but improved    Plan:     Advised on physical therapy supportive shoe gear usage and injected the right plantar fascia 3 mA Kenalog 5 g Xylocaine and advised on returning if symptoms persist

## 2014-10-11 ENCOUNTER — Ambulatory Visit (INDEPENDENT_AMBULATORY_CARE_PROVIDER_SITE_OTHER): Payer: BLUE CROSS/BLUE SHIELD | Admitting: Podiatry

## 2014-10-11 ENCOUNTER — Ambulatory Visit (INDEPENDENT_AMBULATORY_CARE_PROVIDER_SITE_OTHER): Payer: BLUE CROSS/BLUE SHIELD

## 2014-10-11 ENCOUNTER — Encounter: Payer: Self-pay | Admitting: Podiatry

## 2014-10-11 VITALS — BP 119/64 | HR 69 | Resp 16

## 2014-10-11 DIAGNOSIS — M779 Enthesopathy, unspecified: Secondary | ICD-10-CM | POA: Diagnosis not present

## 2014-10-11 MED ORDER — TRIAMCINOLONE ACETONIDE 10 MG/ML IJ SUSP
10.0000 mg | Freq: Once | INTRAMUSCULAR | Status: AC
  Administered 2014-10-11: 10 mg

## 2014-10-13 NOTE — Progress Notes (Signed)
Subjective:     Patient ID: Tammy Mccoy, female   DOB: 14-Mar-1950, 64 y.o.   MRN: [id number]  HPI patient presents with quite a bit of discomfort in the outside of the left foot that makes walking difficult and states that she's tried ice and reduced activity   Review of Systems     Objective:   Physical Exam Neurovascular status intact muscle strength adequate with discomfort in the lateral side of the left foot along the peroneal tertius tendon with inflammation and fluid upon palpation but no indication of muscle strength loss    Assessment:     Tendinitis of the left lateral side of foot    Plan:     Careful sheath injection administered 3 mg Kenalog 5 mg Xylocaine and applied ice and advised on anti-inflammatory

## 2014-12-25 ENCOUNTER — Telehealth: Payer: Self-pay | Admitting: Cardiology

## 2014-12-25 NOTE — Telephone Encounter (Signed)
Left message on home phone.

## 2014-12-25 NOTE — Telephone Encounter (Signed)
Ward. Pt set up for 11/8 appt at time of initial call.

## 2014-12-25 NOTE — Telephone Encounter (Signed)
Pt called in that she went to the urgent care last night because she was having some palpitations. When she was discharged her Stanton FNP, told her that she had pre mature ventricular depolarization and that she needed to f/u with her cardiologist as soon as possible. Please f/u  Thanks

## 2014-12-25 NOTE — Telephone Encounter (Signed)
She is concerned and would like to be seen sooner.

## 2014-12-25 NOTE — Telephone Encounter (Signed)
Tammy Mccoy returned your call . Please call at [phone]

## 2014-12-26 NOTE — Telephone Encounter (Signed)
Called and spoke to patient.  She reports she recently went to urgent care for palpitations. These started up a few days ago. She reports she has been "under a lot of stress", but reports that the palpitations were better yesterday and today - not noticeable.  She has an appt scheduled for 11/8. Does she need to be seen sooner? Any recommendations for tests? The urgent care provider mentioned echocardiogram.  Informed pt I would get Dr. Rosezella [place] advice.

## 2014-12-26 NOTE — Telephone Encounter (Signed)
I think we can wait until the 8th.  No studies are needed prior to this appt.

## 2014-12-27 NOTE — Telephone Encounter (Signed)
Patient aware.

## 2015-01-07 ENCOUNTER — Encounter: Payer: Self-pay | Admitting: Cardiology

## 2015-01-07 ENCOUNTER — Ambulatory Visit (INDEPENDENT_AMBULATORY_CARE_PROVIDER_SITE_OTHER): Payer: BLUE CROSS/BLUE SHIELD | Admitting: Cardiology

## 2015-01-07 VITALS — BP 130/78 | HR 79 | Ht 65.5 in | Wt 289.4 lb

## 2015-01-07 DIAGNOSIS — R002 Palpitations: Secondary | ICD-10-CM | POA: Diagnosis not present

## 2015-01-07 NOTE — Progress Notes (Signed)
HPI The patient presents for evaluation of palpitations. I had seen her previously for evaluation of dyspnea.  I sent her for a POET (Plain Old Exercise Treadmill).  There was no evidence for ischemia.  However, she did have a hypertensive response.  She recently was seen in urgent care. I have reviewed these records. She had PVCs in a trigeminal pattern.her EKG was otherwise unremarkable. She said she was under significant emotional stress at that time. When it was happening she took half of a Xanax this helped. She has since had relief of emotional stress and she's not having the palpitations. Is not having any presyncope or syncope.  The patient denies any new symptoms such as chest discomfort, neck or arm discomfort. There has been no new shortness of breath, PND or orthopnea.  Of note her dyspnea is better than it was previously.  Allergies  Allergen Reactions  . Ibuprofen     Current Outpatient Prescriptions  Medication Sig Dispense Refill  . ALPRAZolam (XANAX) 0.5 MG tablet Take 0.5 mg by mouth. 1 to 1/2 tablet  At bedtime prn    . FLUoxetine (PROZAC) 10 MG capsule Take 3 capsules by mouth daily.    . fluticasone (FLONASE) 50 MCG/ACT nasal spray Place 2 sprays into both nostrils daily.    . naproxen (NAPROSYN) 500 MG tablet Take 500 mg by mouth 3 times daily with meals, bedtime and 2 AM. As needed    . triamcinolone cream (KENALOG) 0.1 % Apply 1 application topically. Use sparingly to affected area as needed    . Vitamin D, Ergocalciferol, (DRISDOL) 50000 UNITS CAPS capsule Take 50,000 Units by mouth 2 (two) times a week.     No current facility-administered medications for this visit.    Past Medical History  Diagnosis Date  . Sleep apnea   . Depression   . Skin cancer, basal cell   . Squamous cell skin cancer   . H/O rosacea     Past Surgical History  Procedure Laterality Date  . Cesarean section    . Carpal tunnel release      Bilateral  . Tonsillectomy and  adenoidectomy    . Tubal ligation      ROS:  As stated in the HPI and negative for all other systems.  PHYSICAL EXAM BP 130/78 mmHg  Pulse 79  Ht 5' 5.5" (1.664 m)  Wt 289 lb 6.4 oz (131.271 kg)  BMI 47.41 kg/m2 GENERAL:  Well appearing NECK:  No jugular venous distention, waveform within normal limits, carotid upstroke brisk and symmetric, no bruits, no thyromegaly LUNGS:  Clear to auscultation bilaterally CHEST:  Unremarkable HEART:  PMI not displaced or sustained,S1 and S2 within normal limits, no S3, no S4, no clicks, no rubs, brief systolic murmur at the right upper sternal border, no diastolic murmurs ABD:  Flat, positive bowel sounds normal in frequency in pitch, no bruits, no rebound, no guarding, no midline pulsatile mass, no hepatomegaly, no splenomegaly EXT:  2 plus pulses throughout, trace edema, no cyanosis no clubbing  EKG:  Sinus rhythm, rate 79, axis within normal limits, intervals within normal limits, no acute ST-T wave changes. RSR prime V1 and V2 01/07/2015  ASSESSMENT AND PLAN  PALPITATIONS:  These have improved with less emotional stress. No change in therapy or further evaluation is indicated.  DYSPNEA:  This is improved no change in therapy is indicated.  OBESITY:  The patient understands the need to lose weight with diet and exercise. We have  discussed specific strategies for this.    SLEEP APNEA:  This was mild. She had consultation with a sleep specialist and they decided she would lose weight and not need CPAP.  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 This can be followed clinically.  HTN:  The blood pressure is at target. No change in medications is indicated. We will continue with therapeutic lifestyle changes (TLC).  Outside records reviewed.

## 2015-01-07 NOTE — Patient Instructions (Signed)
Your physician recommends that you schedule a follow-up appointment As Needed  

## 2015-07-24 ENCOUNTER — Other Ambulatory Visit (HOSPITAL_COMMUNITY)
Admission: RE | Admit: 2015-07-24 | Discharge: 2015-07-24 | Disposition: A | Discharge status: Home / Self Care | Payer: BLUE CROSS/BLUE SHIELD | Source: Ambulatory Visit | Attending: Family Medicine | Admitting: Family Medicine

## 2015-07-24 ENCOUNTER — Other Ambulatory Visit: Payer: Self-pay | Admitting: Family Medicine

## 2015-07-24 DIAGNOSIS — Z124 Encounter for screening for malignant neoplasm of cervix: Secondary | ICD-10-CM | POA: Diagnosis present

## 2015-07-25 LAB — CYTOLOGY - PAP

## 2015-09-15 ENCOUNTER — Encounter: Payer: Self-pay | Admitting: Podiatry

## 2015-09-15 ENCOUNTER — Ambulatory Visit (INDEPENDENT_AMBULATORY_CARE_PROVIDER_SITE_OTHER): Payer: BLUE CROSS/BLUE SHIELD | Admitting: Podiatry

## 2015-09-15 VITALS — BP 142/74 | HR 74 | Resp 16

## 2015-09-15 DIAGNOSIS — M7662 Achilles tendinitis, left leg: Secondary | ICD-10-CM | POA: Diagnosis not present

## 2015-09-15 MED ORDER — TRIAMCINOLONE ACETONIDE 10 MG/ML IJ SUSP
10.0000 mg | Freq: Once | INTRAMUSCULAR | Status: AC
  Administered 2015-09-15: 10 mg

## 2015-09-15 NOTE — Progress Notes (Signed)
Subjective:     Patient ID: Tammy Mccoy, female   DOB: May 12, 1950, 65 y.o.   MRN: [id number]  HPI patient presents with pain in the posterior aspect of the heel left over right with inflammation and mild fluid buildup noted. It is sore when pressed and she does not remember specific injury   Review of Systems     Objective:   Physical Exam Neurovascular status intact muscle strength adequate with inflammation and pain on the lateral side of the left Achilles around the insertion into the calcaneus with fluid buildup noted of a mild to moderate nature    Assessment:     Acute Achilles tendinitis left over right    Plan:     H&P condition reviewed and recommended careful injection explaining chances for rupture. Patient wants procedure and today I carefully infiltrated with 3 mg dexamethasone Kenalog 5 mill grams Xylocaine on the lateral side not directly into the tendon advised him reduced activity ice therapy and gradual stretching exercises and reappoint to recheck

## 2015-12-29 ENCOUNTER — Ambulatory Visit (INDEPENDENT_AMBULATORY_CARE_PROVIDER_SITE_OTHER): Payer: BLUE CROSS/BLUE SHIELD | Admitting: Podiatry

## 2015-12-29 DIAGNOSIS — M7662 Achilles tendinitis, left leg: Secondary | ICD-10-CM

## 2015-12-29 DIAGNOSIS — M25572 Pain in left ankle and joints of left foot: Secondary | ICD-10-CM

## 2015-12-29 MED ORDER — NONFORMULARY OR COMPOUNDED ITEM: 1.0000 g | Freq: Four times a day (QID) | 2 refills | Status: DC

## 2015-12-29 MED ORDER — BETAMETHASONE SOD PHOS & ACET 6 (3-3) MG/ML IJ SUSP: 3.0000 mg | Freq: Once | INTRAMUSCULAR | Status: DC

## 2015-12-29 NOTE — Addendum Note (Signed)
Addended by: Johnnye Lana A on: 12/29/2015 05:38 PM   Modules accepted: Orders

## 2015-12-29 NOTE — Progress Notes (Signed)
Subjective:  Patient presents today for complaint of insertional Achilles tendinitis to the left ankle. Patient states that she has been seen in the past by Dr. Paulla Dolly. Patient states the pain has come back over the last several weeks. She does state that injections helped last time.    Objective/Physical Exam General: The patient is alert and oriented x3 in no acute distress.  Dermatology: Skin is warm, dry and supple bilateral lower extremities. Negative for open lesions or macerations.  Vascular: Palpable pedal pulses bilaterally. No edema or erythema noted. Capillary refill within normal limits.  Neurological: Epicritic and protective threshold grossly intact bilaterally.   Musculoskeletal Exam: Pain on palpation noted to the medial and lateral aspects of the insertion of the Achilles tendon. Hypertrophic retrocalcaneal spur formation is palpable on clinical exam. Range of motion within normal limits to all pedal and ankle joints bilateral. Muscle strength 5/5 in all groups bilateral.   Radiographic Exam:  Large posterior heel spur noted to the left calcaneus.  Assessment: #1 insertional Achilles tendinitis left #2 pain in left ankle #3 retrocalcaneal bursitis left   Plan of Care:  #1 Patient was evaluated. #2 injection of 0.5 mL Celestone Soluspan injected in the patient's bursa of the left posterior ankle. #3 prescription for anti-inflammatory pain cream dispensed through [place] #4 today cam boot was dispensed #5 patient is to return to clinic in 4 weeks with Dr. Paulla Dolly  Dr. Edrick Kins, DPM [facility]

## 2016-01-26 ENCOUNTER — Ambulatory Visit: Payer: BLUE CROSS/BLUE SHIELD | Admitting: Podiatry

## 2016-02-17 ENCOUNTER — Other Ambulatory Visit: Payer: Self-pay | Admitting: Internal Medicine

## 2016-02-17 DIAGNOSIS — E049 Nontoxic goiter, unspecified: Secondary | ICD-10-CM

## 2016-03-02 ENCOUNTER — Other Ambulatory Visit: Payer: BLUE CROSS/BLUE SHIELD

## 2016-03-03 ENCOUNTER — Ambulatory Visit
Admission: RE | Admit: 2016-03-03 | Discharge: 2016-03-03 | Disposition: A | Discharge status: Home / Self Care | Payer: BLUE CROSS/BLUE SHIELD | Source: Ambulatory Visit | Attending: Internal Medicine | Admitting: Internal Medicine

## 2016-03-03 DIAGNOSIS — E049 Nontoxic goiter, unspecified: Secondary | ICD-10-CM

## 2016-03-17 ENCOUNTER — Ambulatory Visit: Payer: BLUE CROSS/BLUE SHIELD | Admitting: Podiatry

## 2016-03-22 ENCOUNTER — Ambulatory Visit: Payer: BLUE CROSS/BLUE SHIELD | Admitting: Podiatry

## 2016-03-24 ENCOUNTER — Other Ambulatory Visit (HOSPITAL_COMMUNITY): Payer: Self-pay | Admitting: Surgery

## 2016-03-24 DIAGNOSIS — E210 Primary hyperparathyroidism: Secondary | ICD-10-CM

## 2016-03-26 ENCOUNTER — Ambulatory Visit: Payer: BLUE CROSS/BLUE SHIELD | Admitting: Podiatry

## 2016-03-31 ENCOUNTER — Encounter (HOSPITAL_COMMUNITY): Payer: BLUE CROSS/BLUE SHIELD

## 2016-04-01 ENCOUNTER — Ambulatory Visit: Payer: BLUE CROSS/BLUE SHIELD | Admitting: Podiatry

## 2016-04-05 ENCOUNTER — Encounter (HOSPITAL_COMMUNITY)
Admission: RE | Admit: 2016-04-05 | Discharge: 2016-04-05 | Disposition: A | Discharge status: Home / Self Care | Payer: BLUE CROSS/BLUE SHIELD | Source: Ambulatory Visit | Attending: Surgery | Admitting: Surgery

## 2016-04-05 ENCOUNTER — Encounter (HOSPITAL_COMMUNITY): Payer: Self-pay

## 2016-04-05 DIAGNOSIS — E210 Primary hyperparathyroidism: Secondary | ICD-10-CM | POA: Diagnosis present

## 2016-04-05 MED ORDER — TECHNETIUM TC 99M SESTAMIBI - CARDIOLITE
26.9000 | Freq: Once | INTRAVENOUS | Status: AC | PRN
  Administered 2016-04-05: 26.9 via INTRAVENOUS

## 2016-04-12 ENCOUNTER — Encounter: Payer: Self-pay | Admitting: Podiatry

## 2016-04-12 ENCOUNTER — Ambulatory Visit (INDEPENDENT_AMBULATORY_CARE_PROVIDER_SITE_OTHER): Payer: BLUE CROSS/BLUE SHIELD

## 2016-04-12 ENCOUNTER — Ambulatory Visit (INDEPENDENT_AMBULATORY_CARE_PROVIDER_SITE_OTHER): Payer: BLUE CROSS/BLUE SHIELD | Admitting: Podiatry

## 2016-04-12 DIAGNOSIS — M7662 Achilles tendinitis, left leg: Secondary | ICD-10-CM

## 2016-04-13 NOTE — Progress Notes (Addendum)
Subjective:     Patient ID: Tammy Mccoy, female   DOB: [birth year], 66 y.o.   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 Before I would consider aggressive surgery which also would require gastroc lengthening I have recommended shockwave therapy to see whether or not this consult her problem in a conservative manner. Patient wants to undergo this and at this time is scheduled for shockwave therapy and a short air fracture walker is dispensed with the hope she'll be able to wear this after the procedures are accomplished. It is mostly the center and the lateral side of the posterior left heel  Views of the left heel indicate large spur formation with no indication of stress fracture arthritis

## 2016-04-22 ENCOUNTER — Ambulatory Visit (INDEPENDENT_AMBULATORY_CARE_PROVIDER_SITE_OTHER): Payer: Self-pay | Admitting: Podiatry

## 2016-04-22 DIAGNOSIS — R52 Pain, unspecified: Secondary | ICD-10-CM

## 2016-04-22 DIAGNOSIS — M7662 Achilles tendinitis, left leg: Secondary | ICD-10-CM

## 2016-04-22 NOTE — Progress Notes (Signed)
   Subjective:    Patient ID: Tammy Mccoy, female    DOB: 14-Nov-1950, 66 y.o.   MRN: [id number]  HPI Pt presents stating that she has had severe ongoing pain in heel that she has tried various therapies for withy no relief. She is here today for ESWT treatment  Review of Systems All other systems negative    Objective:   Physical Exam Noted nodule/swelling on the back of patient's left heel. Pain on palpation at insertion site of achilles tendon     Assessment & Plan:  ESWT administered to Lt heel at 15 joules and tolerated well. EPAt therapy administered to surrounding connective tissues. Advised on boot usage and avoidance of NSAIDS and ice. Re-appointed in 1 week for 2nd treatment

## 2016-04-23 ENCOUNTER — Other Ambulatory Visit: Payer: Self-pay | Admitting: Surgery

## 2016-04-23 DIAGNOSIS — E210 Primary hyperparathyroidism: Secondary | ICD-10-CM

## 2016-04-27 ENCOUNTER — Inpatient Hospital Stay: Admission: RE | Admit: 2016-04-27 | Payer: BLUE CROSS/BLUE SHIELD | Source: Ambulatory Visit

## 2016-04-27 ENCOUNTER — Ambulatory Visit: Payer: BLUE CROSS/BLUE SHIELD | Admitting: Podiatry

## 2016-04-27 DIAGNOSIS — M7662 Achilles tendinitis, left leg: Secondary | ICD-10-CM

## 2016-04-30 ENCOUNTER — Ambulatory Visit: Payer: BLUE CROSS/BLUE SHIELD

## 2016-04-30 NOTE — Progress Notes (Signed)
   Subjective:    Patient ID: Tammy Mccoy, female    DOB: 07/14/1950, 66 y.o.   MRN: [id number]  HPI Pt presents stating that she has had severe ongoing pain in heel that she has tried various therapies for withy no relief. States that she has noticed a slight improvement in her pain since treatment  Review of Systems All other systems negative    Objective:   Physical Exam Noted nodule/swelling on the back of patient's left heel. Pain on palpation at insertion site of achilles tendon     Assessment & Plan:  ESWT administered to Lt heel at 16 joules and tolerated well. EPAt therapy administered to surrounding connective tissues. Advised on boot usage and avoidance of NSAIDS and ice. Re-appointed in 1 week for 3rd treatment

## 2016-05-03 ENCOUNTER — Ambulatory Visit
Admission: RE | Admit: 2016-05-03 | Discharge: 2016-05-03 | Disposition: A | Discharge status: Home / Self Care | Payer: BLUE CROSS/BLUE SHIELD | Source: Ambulatory Visit | Attending: Surgery | Admitting: Surgery

## 2016-05-03 DIAGNOSIS — E210 Primary hyperparathyroidism: Secondary | ICD-10-CM

## 2016-05-03 MED ORDER — IOPAMIDOL (ISOVUE-300) INJECTION 61%
75.0000 mL | Freq: Once | INTRAVENOUS | Status: AC | PRN
  Administered 2016-05-03: 75 mL via INTRAVENOUS

## 2016-05-04 ENCOUNTER — Ambulatory Visit: Payer: BLUE CROSS/BLUE SHIELD

## 2016-05-04 DIAGNOSIS — M779 Enthesopathy, unspecified: Secondary | ICD-10-CM

## 2016-05-04 DIAGNOSIS — M7662 Achilles tendinitis, left leg: Secondary | ICD-10-CM

## 2016-05-05 NOTE — Progress Notes (Signed)
   Subjective:    Patient ID: Tammy Mccoy, female    DOB: 1950-08-23, 66 y.o.   MRN: [id number]  HPI Pt presents stating that she has had severe ongoing pain in heel that she has tried various therapies for withy no relief. States that she has noticed a slight improvement in her pain since treatment  Review of Systems All other systems negative    Objective:   Physical Exam Noted nodule/swelling on the back of patient's left heel. Pain on palpation at insertion site of achilles tendon     Assessment & Plan:  ESWT administered to Lt heel at 13 joules and tolerated well. EPAt therapy administered to surrounding connective tissues. Advised on boot usage and avoidance of NSAIDS and ice. Re-appointed in 4 weeks for 4th treatment

## 2016-05-07 ENCOUNTER — Ambulatory Visit: Payer: Self-pay | Admitting: Surgery

## 2016-05-10 ENCOUNTER — Encounter: Payer: Self-pay | Admitting: Vascular Surgery

## 2016-05-24 ENCOUNTER — Encounter: Payer: Self-pay | Admitting: Vascular Surgery

## 2016-05-24 ENCOUNTER — Ambulatory Visit (INDEPENDENT_AMBULATORY_CARE_PROVIDER_SITE_OTHER): Payer: BLUE CROSS/BLUE SHIELD | Admitting: Vascular Surgery

## 2016-05-24 VITALS — BP 131/66 | HR 81 | Temp 98.7°F | Resp 16 | Ht 65.0 in | Wt 287.0 lb

## 2016-05-24 DIAGNOSIS — I83892 Varicose veins of left lower extremities with other complications: Secondary | ICD-10-CM

## 2016-05-24 NOTE — Progress Notes (Signed)
Subjective:     Patient ID: Tammy Mccoy, female   DOB: January 28, 1951, 66 y.o.   MRN: [id number]  HPI This 66 year old female returns reevaluation regarding her painful varicosities in the left leg. I previously saw her in 2009 and at that time she had painful varicosities in the left leg with gross reflux in the left great saphenous vein. She had some other issues which prevented her from having treatment at that time. Since then she has developed increasing swelling in the left lower leg with increasing pain and the number of varicosities which are more prominent. She has no history of DVT thrombophlebitis stasis ulcers or bleeding. She has no symptoms the contralateral right leg. She takes no blood thinners.  Past Medical History:  Diagnosis Date  . Depression   . H/O rosacea   . Skin cancer, basal cell   . Sleep apnea   . Squamous cell skin cancer     Social History  Substance Use Topics  . Smoking status: Former Smoker    Packs/day: 1.00    Years: 35.00    Types: Cigarettes    Quit date: 03/01/2008  . Smokeless tobacco: Former Systems developer    Quit date: 03/01/2008  . Alcohol use No    Family History  Problem Relation Age of Onset  . Cancer Father     Bladder  . CVA Mother 35    Allergies  Allergen Reactions  . Bupropion Other (See Comments)  . Ibuprofen Other (See Comments)     Current Outpatient Prescriptions:  .  ALPRAZolam (XANAX) 0.5 MG tablet, Take 0.5 mg by mouth. 1 to 1/2 tablet  At bedtime prn, Disp: , Rfl:  .  FLUoxetine (PROZAC) 10 MG capsule, Take 3 capsules by mouth daily., Disp: , Rfl:  .  fluticasone (FLONASE) 50 MCG/ACT nasal spray, Place 2 sprays into both nostrils daily., Disp: , Rfl:  .  naproxen (NAPROSYN) 500 MG tablet, Take 500 mg by mouth 2 (two) times daily as needed., Disp: , Rfl:  .  NONFORMULARY OR COMPOUNDED ITEM, Apply 1-2 g topically 4 (four) times daily., Disp: 120 each, Rfl: 2 .  triamcinolone cream (KENALOG) 0.1 %, Apply 1 application  topically. Use sparingly to affected area as needed, Disp: , Rfl:  .  Vitamin D, Ergocalciferol, (DRISDOL) 50000 UNITS CAPS capsule, Take 50,000 Units by mouth 2 (two) times a week., Disp: , Rfl:   Current Facility-Administered Medications:  .  betamethasone acetate-betamethasone sodium phosphate (CELESTONE) injection 3 mg, 3 mg, Intramuscular, Once, Edrick Kins, DPM  Vitals:   05/24/16 1402  BP: 131/66  Pulse: 81  Resp: 16  Temp: 98.7 F (37.1 C)  SpO2: 97%  Weight: 287 lb (130.2 kg)  Height: 5\' 5"  (1.651 m)    Body mass index is 47.76 kg/m.         Review of Systems Eyes chest pain, dyspnea on exertion, PND, orthopnea, hemoptysis.    Objective:   Physical Exam BP 131/66 (BP Location: Left Arm, Patient Position: Sitting, Cuff Size: Large)   Pulse 81   Temp 98.7 F (37.1 C)   Resp 16   Ht 5\' 5"  (1.651 m)   Wt 287 lb (130.2 kg)   SpO2 97%   BMI 47.76 kg/m     Gen.-alert and oriented x3 in no apparent distress-obese HEENT normal for age Lungs no rhonchi or wheezing Cardiovascular regular rhythm no murmurs carotid pulses 3+ palpable no bruits audible Abdomen soft nontender no palpable masses Musculoskeletal free of  major deformities Skin clear -no rashes Neurologic normal Lower extremities 3+ femoral and dorsalis pedis pulses palpable bilaterally with no edema on the right 1+ to 2+ edema on the left Bulging varicosities beginning in the mid third of the left thigh extending medially and anteriorly down into the medial calf with 1-2+ edema and no hyperpigmentation or ulceration distally.  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

## 2016-05-26 NOTE — Addendum Note (Signed)
Addended by: Lianne Cure A on: 05/26/2016 09:18 AM   Modules accepted: Orders

## 2016-06-03 NOTE — Patient Instructions (Signed)
Tammy Mccoy  06/03/2016   Your procedure is scheduled on: 06/11/16  Report to [facility] Main  Entrance take [place]  elevators to 3rd floor to  [place] at    [place] AM.  Call this number if you have problems the morning of surgery [phone]   Remember: ONLY 1 PERSON MAY GO WITH YOU TO SHORT STAY TO GET  READY MORNING OF [place].  Do not eat food or drink liquids :After Midnight.     Take these medicines the morning of surgery with A SIP OF WATER: Fluoxetine(prozac),  Flonase,  Zantac if needed                                You may not have any metal on your body including hair pins and              piercings  Do not wear jewelry, make-up, lotions, powders or perfumes, deodorant             Do not wear nail polish.  Do not shave  48 hours prior to surgery.     Do not bring valuables to the hospital. [place].  Contacts, dentures or bridgework may not be worn into surgery.     Patients discharged the day of surgery will not be allowed to drive home.  Name and phone number of your driver:  Special Instructions: N/A              Please read over the following fact sheets you were given: _____________________________________________________________________             [facility] - Preparing for Surgery Before surgery, you can play an important role.  Because skin is not sterile, your skin needs to be as free of germs as possible.  You can reduce the number of germs on your skin by washing with CHG (chlorahexidine gluconate) soap before surgery.  CHG is an antiseptic cleaner which kills germs and bonds with the skin to continue killing germs even after washing. Please DO NOT use if you have an allergy to CHG or antibacterial soaps.  If your skin becomes reddened/irritated stop using the CHG and inform your nurse when you arrive at Short Stay. Do not shave (including legs and underarms) for  at least 48 hours prior to the first CHG shower.  You may shave your face/neck. Please follow these instructions carefully:  1.  Shower with CHG Soap the night before surgery and the  morning of Surgery.  2.  If you choose to wash your hair, wash your hair first as usual with your  normal  shampoo.  3.  After you shampoo, rinse your hair and body thoroughly to remove the  shampoo.                           4.  Use CHG as you would any other liquid soap.  You can apply chg directly  to the skin and wash                       Gently with a scrungie or clean washcloth.  5.  Apply the CHG  Soap to your body ONLY FROM THE NECK DOWN.   Do not use on face/ open                           Wound or open sores. Avoid contact with eyes, ears mouth and genitals (private parts).                       Wash face,  Genitals (private parts) with your normal soap.             6.  Wash thoroughly, paying special attention to the area where your surgery  will be performed.  7.  Thoroughly rinse your body with warm water from the neck down.  8.  DO NOT shower/wash with your normal soap after using and rinsing off  the CHG Soap.                9.  Pat yourself dry with a clean towel.            10.  Wear clean pajamas.            11.  Place clean sheets on your bed the night of your first shower and do not  sleep with pets. Day of Surgery : Do not apply any lotions/deodorants the morning of surgery.  Please wear clean clothes to the hospital/surgery center.  FAILURE TO FOLLOW THESE INSTRUCTIONS MAY RESULT IN THE CANCELLATION OF YOUR SURGERY PATIENT SIGNATURE_________________________________  NURSE SIGNATURE__________________________________  ________________________________________________________________________

## 2016-06-04 ENCOUNTER — Ambulatory Visit (INDEPENDENT_AMBULATORY_CARE_PROVIDER_SITE_OTHER): Payer: Self-pay | Admitting: Podiatry

## 2016-06-04 DIAGNOSIS — M7662 Achilles tendinitis, left leg: Secondary | ICD-10-CM

## 2016-06-04 DIAGNOSIS — M779 Enthesopathy, unspecified: Secondary | ICD-10-CM

## 2016-06-04 NOTE — Progress Notes (Signed)
   Subjective:    Patient ID: Tammy Mccoy, female    DOB: 1950/12/03, 66 y.o.   MRN: [id number]  HPI Pt presents stating that she has had severe ongoing pain in heel that she has tried various therapies for withy no relief. States that she has noticed a slight improvement in her pain since treatment  Review of Systems All other systems negative    Objective:   Physical Exam Noted nodule/swelling on the back of patient's left heel. Pain on palpation at insertion site of achilles tendon     Assessment & Plan:  ESWT administered to Lt heel at 15 joules and tolerated well. EPAt therapy administered to surrounding connective tissues. Advised on boot usage and avoidance of NSAIDS and ice. Re-appointed in 6 for evaluation with Dr Paulla Dolly

## 2016-06-07 ENCOUNTER — Encounter (HOSPITAL_COMMUNITY): Payer: Self-pay | Admitting: Surgery

## 2016-06-07 ENCOUNTER — Encounter (HOSPITAL_COMMUNITY)
Admission: RE | Admit: 2016-06-07 | Discharge: 2016-06-07 | Disposition: A | Discharge status: Home / Self Care | Payer: BLUE CROSS/BLUE SHIELD | Source: Ambulatory Visit | Attending: Family Medicine | Admitting: Family Medicine

## 2016-06-07 DIAGNOSIS — E210 Primary hyperparathyroidism: Secondary | ICD-10-CM | POA: Diagnosis present

## 2016-06-07 DIAGNOSIS — E042 Nontoxic multinodular goiter: Secondary | ICD-10-CM | POA: Diagnosis present

## 2016-06-07 NOTE — H&P (Signed)
General Surgery [facility]  Tammy Mccoy DOB: 17-Mar-1950 Widowed / Language: Cleophus Molt / Race: White Female  History of Present Illness   The patient is a 66 year old female who presents with a thyroid nodule.  Patient is referred by Dr. Delrae Rend for evaluation of primary hyperparathyroidism and bilateral thyroid nodules. Patient's primary care physician is Dr. Harlan Stains. Patient has a family history of thyroid cancer in her mother and her sister. Both of them had surgery. Patient also has a sister with hypercalcemia who is being evaluated for possible parathyroid disease. On evaluation, the patient was noted to have mildly elevated serum calcium levels ranging from 10.5-10.7. Vitamin D level was normal at 33.9 after treatment. Intact PTH level was mildly elevated at 82. Patient had 24-hour urine collection performed which was elevated at 410. She has not had a recent bone density scan. She denies nephrolithiasis. She does note chronic fatigue. She cries easily and is being treated for depression. She has bone and joint discomfort. She has frequent urination. Patient also underwent an ultrasound examination of the thyroid on March 03, 2016. This showed bilateral thyroid nodules. On the right the nodule measured 11 mm and was unchanged from a study 2 years ago. On the left the nodule measured 13 mm and was minimally changed compared to a study 2 years ago. Follow-up ultrasound in 1 year was recommended. No worrisome features were identified. Patient denies tremors or palpitations. TSH level is normal at 1.48.   Past Surgical History  Appendectomy  Cesarean Section - Multiple  Colon Polyp Removal - Colonoscopy  Tonsillectomy   Diagnostic Studies History Colonoscopy  1-5 years ago Mammogram  within last year  Allergies Ibuprofen *ANALGESICS - ANTI-INFLAMMATORY*  Wellbutrin *ANTIDEPRESSANTS*   Medication History  PROzac  (20MG  Capsule, 30 mg QD Oral) Active. Vitamin D3 (5000UNIT Capsule, Oral) Active. Medications Reconciled  Social History Alcohol use  Remotely quit alcohol use. Caffeine use  Coffee. No drug use  Tobacco use  Former smoker.  Family History  Alcohol Abuse  Father, Mother, Son. Arthritis  Mother, Sister. Breast Cancer  Family Members In General, Sister. Cancer  Family Members In General, Father, Mother, Sister. Colon Polyps  Mother. Depression  Daughter, Mother, Sister. Migraine Headache  Daughter. Respiratory Condition  Mother. Thyroid problems  Family Members In General, Mother, Sister.  Pregnancy / Birth History  Age at menarche  38 years. Age of menopause  28-50 Contraceptive History  Oral contraceptives. Gravida  3 Maternal age  82-30 Para  2  Other Problems Alcohol Abuse  Anxiety Disorder  Arthritis  Thyroid Disease   Review of Systems General Present- Fatigue. Not Present- Appetite Loss, Chills, Fever, Night Sweats, Weight Gain and Weight Loss. Skin Present- Dryness. Not Present- Change in Wart/Mole, Hives, Jaundice, New Lesions, Non-Healing Wounds, Rash and Ulcer. HEENT Present- Seasonal Allergies. Not Present- Earache, Hearing Loss, Hoarseness, Nose Bleed, Oral Ulcers, Ringing in the Ears, Sinus Pain, Sore Throat, Visual Disturbances, Wears glasses/contact lenses and Yellow Eyes. Gastrointestinal Present- Bloating and Excessive gas. Not Present- Abdominal Pain, Bloody Stool, Change in Bowel Habits, Chronic diarrhea, Constipation, Difficulty Swallowing, Gets full quickly at meals, Hemorrhoids, Indigestion, Nausea, Rectal Pain and Vomiting. Female Genitourinary Present- Nocturia and Urgency. Not Present- Frequency, Painful Urination and Pelvic Pain. Musculoskeletal Present- Joint Pain, Joint Stiffness and Muscle Pain. Not Present- Back Pain, Muscle Weakness and Swelling of Extremities. Neurological Present- Decreased Memory. Not Present-  Fainting, Headaches, Numbness, Seizures, Tingling, Tremor, Trouble walking and Weakness.  Psychiatric Present- Anxiety, Change in Sleep Pattern, Depression and Frequent crying. Not Present- Bipolar and Fearful. Endocrine Present- Hair Changes. Not Present- Cold Intolerance, Excessive Hunger, Heat Intolerance, Hot flashes and New Diabetes. Hematology Not Present- Blood Thinners, Easy Bruising, Excessive bleeding, Gland problems, HIV and Persistent Infections.  Vitals Pulse: 79 (Regular)  BP: 130/70 (Sitting, Left Arm, Standard) Weight: 272 lb Height: 65in Body Surface Area: 2.25 m Body Mass Index: 45.26 kg/m    Physical Exam The physical exam findings are as follows: Note:General - appears comfortable, no distress; not diaphorectic  HEENT - normocephalic; sclerae clear, gaze conjugate; mucous membranes moist, dentition good; voice normal  Neck - symmetric on extension; no palpable anterior or posterior cervical adenopathy; no palpable masses in the thyroid bed  Chest - clear bilaterally without rhonchi, rales, or wheeze  Cor - regular rhythm with normal rate; no significant murmur  Ext - non-tender without significant edema or lymphedema  Neuro - grossly intact; no tremor   Assessment & Plan  PRIMARY HYPERPARATHYROIDISM (E21.0)  MULTIPLE THYROID NODULES (E04.2)  Follow Up - Call [facility] office after tests / studies doneto discuss further plans  Patient presents on referral from her endocrinologist for evaluation of hypercalcemia and suspected primary hyperparathyroidism. Patient is also noted to have bilateral small thyroid nodules which have been clinically stable. Patient is provided with written literature on parathyroid disease to review at home. She is accompanied by a friend.  First, we reviewed her thyroid nodules and her history. Both nodules are small. Both nodules are clinically stable on sequential ultrasound scanning. There is no indication for biopsy  or surgery at this time. I recommend repeat thyroid ultrasound in 1 year with physical examination here at our office.  Secondly, the patient does have mild hypercalcemia, elevated intact PTH level, and elevated urinary calcium excretion. She likely has primary hyperparathyroidism with multiple symptoms. I have recommended proceeding with nuclear medicine parathyroid scan in hopes of identifying a parathyroid adenoma. If we can identify an adenoma, I believe she will be a good candidate for minimally invasive surgery which we discussed today. If the sestamibi scan is negative, we will likely obtain a 4D CT scan in hopes of identifying an adenoma. Patient understands and agrees to the studies.  I will contact the patient with the results of her sestamibi scan when it is available.  ADDENDUM:  Nuclear med scan was unrevealing.  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  Will proceed with exploration and parathyroidectomy.  The risks and benefits of the procedure have been discussed at length with the patient.  The patient understands the proposed procedure, potential alternative treatments, and the course of recovery to be expected.  All of the patient's questions have been answered at this time.  The patient wishes to proceed with surgery.  Earnstine Regal, MD, [facility] Office: [phone]

## 2016-06-07 NOTE — Patient Instructions (Addendum)
Tammy Mccoy  06/07/2016   Your procedure is scheduled on: 06/11/16  Report to [facility] Main  Entrance Take [place]  elevators to 3rd floor to  [place] at      [place] AM.   Call this number if you have problems the morning of surgery [phone]   Remember: ONLY 1 PERSON MAY GO WITH YOU TO SHORT STAY TO GET  READY MORNING OF Tammy Mccoy.  Do not eat food or drink liquids :After Midnight.     Take these medicines the morning of surgery with A SIP OF WATER: Fluoxetine(prozac), flonase, zantac if needed                                You may not have any metal on your body including hair pins and              piercings  Do not wear jewelry, make-up, lotions, powders or perfumes, deodorant             Do not wear nail polish.  Do not shave  48 hours prior to surgery.              Men may shave face and neck.   Do not bring valuables to the hospital. [place].  Contacts, dentures or bridgework may not be worn into surgery.  Leave suitcase in the car. After surgery it may be brought to your room.     Patients discharged the day of surgery will not be allowed to drive home.  Name and phone number of your driver:  Special Instructions: N/A              Please read over the following fact sheets you were given: _____________________________________________________________________  Tam[facility] - Preparing for Surgery Before surgery, you can play an important role.  Because skin is not sterile, your skin needs to be as free of germs as possible.  You can reduce the number of germs on your skin by washing with CHG (chlorahexidine gluconate) soap before surgery.  CHG is an antiseptic cleaner which kills germs and bonds with the skin to continue killing germs even after washing. Please DO NOT use if you have an allergy to CHG or antibacterial soaps.  If your skin becomes reddened/irritated stop using the  CHG and inform your nurse when you arrive at Short Stay. Do not shave (including legs and underarms) for at least 48 hours prior to the first CHG shower.  You may shave your face/neck. Please follow these instructions carefully:  1.  Shower with CHG Soap the night before surgery and the  morning of Surgery.  2.  If you choose to wash your hair, wash your hair first as usual with your  normal  shampoo.  3.  After you shampoo, rinse your hair and body thoroughly to remove the  shampoo.                           4.  Use CHG as you would any other liquid soap.  You can apply chg directly  to the skin and wash  Gently with a scrungie or clean washcloth.  5.  Apply the CHG Soap to your body ONLY FROM THE NECK DOWN.   Do not use on face/ open                           Wound or open sores. Avoid contact with eyes, ears mouth and genitals (private parts).                       Wash face,  Genitals (private parts) with your normal soap.             6.  Wash thoroughly, paying special attention to the area where your surgery  will be performed.  7.  Thoroughly rinse your body with warm water from the neck down.  8.  DO NOT shower/wash with your normal soap after using and rinsing off  the CHG Soap.                9.  Pat yourself dry with a clean towel.            10.  Wear clean pajamas.            11.  Place clean sheets on your bed the night of your first shower and do not  sleep with pets. Day of Surgery : Do not apply any lotions/deodorants the morning of surgery.  Please wear clean clothes to the hospital/surgery [place].  FAILURE TO FOLLOW THESE INSTRUCTIONS MAY RESULT IN THE CANCELLATION OF YOUR SURGERY PATIENT SIGNATURE_________________________________  NURSE SIGNATURE__________________________________  ________________________________________________________________________

## 2016-06-08 ENCOUNTER — Encounter (HOSPITAL_COMMUNITY): Payer: Self-pay

## 2016-06-08 ENCOUNTER — Encounter (HOSPITAL_COMMUNITY)
Admission: RE | Admit: 2016-06-08 | Discharge: 2016-06-08 | Disposition: A | Discharge status: Home / Self Care | Payer: BLUE CROSS/BLUE SHIELD | Source: Ambulatory Visit | Attending: Surgery | Admitting: Surgery

## 2016-06-08 DIAGNOSIS — E210 Primary hyperparathyroidism: Secondary | ICD-10-CM | POA: Diagnosis not present

## 2016-06-08 DIAGNOSIS — E042 Nontoxic multinodular goiter: Secondary | ICD-10-CM | POA: Diagnosis not present

## 2016-06-08 DIAGNOSIS — E041 Nontoxic single thyroid nodule: Secondary | ICD-10-CM | POA: Diagnosis present

## 2016-06-08 DIAGNOSIS — F329 Major depressive disorder, single episode, unspecified: Secondary | ICD-10-CM | POA: Diagnosis not present

## 2016-06-08 DIAGNOSIS — Z87891 Personal history of nicotine dependence: Secondary | ICD-10-CM | POA: Diagnosis not present

## 2016-06-08 DIAGNOSIS — E213 Hyperparathyroidism, unspecified: Secondary | ICD-10-CM

## 2016-06-08 DIAGNOSIS — Z01812 Encounter for preprocedural laboratory examination: Secondary | ICD-10-CM | POA: Insufficient documentation

## 2016-06-08 DIAGNOSIS — Z808 Family history of malignant neoplasm of other organs or systems: Secondary | ICD-10-CM | POA: Diagnosis not present

## 2016-06-08 DIAGNOSIS — I739 Peripheral vascular disease, unspecified: Secondary | ICD-10-CM | POA: Diagnosis not present

## 2016-06-08 DIAGNOSIS — D351 Benign neoplasm of parathyroid gland: Secondary | ICD-10-CM | POA: Diagnosis not present

## 2016-06-08 HISTORY — DX: Unspecified osteoarthritis, unspecified site: M19.90

## 2016-06-08 HISTORY — DX: Claustrophobia: F40.240

## 2016-06-08 HISTORY — DX: Nontoxic multinodular goiter: E04.2

## 2016-06-08 HISTORY — DX: Anxiety disorder, unspecified: F41.9

## 2016-06-08 HISTORY — DX: Unspecified asthma, uncomplicated: J45.909

## 2016-06-08 HISTORY — DX: Family history of other specified conditions: Z84.89

## 2016-06-08 LAB — CBC
HEMATOCRIT: 41.7 % (ref 36.0–46.0)
HEMOGLOBIN: 13.6 g/dL (ref 12.0–15.0)
MCH: 29.5 pg (ref 26.0–34.0)
MCHC: 32.6 g/dL (ref 30.0–36.0)
MCV: 90.5 fL (ref 78.0–100.0)
Platelets: 300 10*3/uL (ref 150–400)
RBC: 4.61 MIL/uL (ref 3.87–5.11)
RDW: 13.2 % (ref 11.5–15.5)
WBC: 5.6 10*3/uL (ref 4.0–10.5)

## 2016-06-08 NOTE — Progress Notes (Signed)
05/03/16 parathyroid /4d neck epic

## 2016-06-10 MED ORDER — DEXTROSE 5 % IV SOLN
3.0000 g | INTRAVENOUS | Status: AC
  Administered 2016-06-11: 3 g via INTRAVENOUS
  Filled 2016-06-10 (×2): qty 3000

## 2016-06-11 ENCOUNTER — Encounter (HOSPITAL_COMMUNITY): Payer: Self-pay | Admitting: *Deleted

## 2016-06-11 ENCOUNTER — Ambulatory Visit (HOSPITAL_COMMUNITY)
Admission: RE | Admit: 2016-06-11 | Discharge: 2016-06-11 | Disposition: A | Discharge status: Home / Self Care | Payer: BLUE CROSS/BLUE SHIELD | Source: Ambulatory Visit | Attending: Surgery | Admitting: Surgery

## 2016-06-11 ENCOUNTER — Encounter (HOSPITAL_COMMUNITY)
Admission: RE | Disposition: A | Discharge status: Home / Self Care | Payer: Self-pay | Source: Ambulatory Visit | Attending: Surgery

## 2016-06-11 ENCOUNTER — Ambulatory Visit (HOSPITAL_COMMUNITY): Payer: BLUE CROSS/BLUE SHIELD | Admitting: Anesthesiology

## 2016-06-11 DIAGNOSIS — E210 Primary hyperparathyroidism: Secondary | ICD-10-CM | POA: Diagnosis not present

## 2016-06-11 DIAGNOSIS — D351 Benign neoplasm of parathyroid gland: Secondary | ICD-10-CM | POA: Insufficient documentation

## 2016-06-11 DIAGNOSIS — Z808 Family history of malignant neoplasm of other organs or systems: Secondary | ICD-10-CM | POA: Insufficient documentation

## 2016-06-11 DIAGNOSIS — E042 Nontoxic multinodular goiter: Secondary | ICD-10-CM | POA: Diagnosis present

## 2016-06-11 DIAGNOSIS — Z87891 Personal history of nicotine dependence: Secondary | ICD-10-CM | POA: Insufficient documentation

## 2016-06-11 DIAGNOSIS — T884XXA Failed or difficult intubation, initial encounter: Secondary | ICD-10-CM

## 2016-06-11 DIAGNOSIS — I739 Peripheral vascular disease, unspecified: Secondary | ICD-10-CM | POA: Insufficient documentation

## 2016-06-11 DIAGNOSIS — F329 Major depressive disorder, single episode, unspecified: Secondary | ICD-10-CM | POA: Insufficient documentation

## 2016-06-11 HISTORY — DX: Failed or difficult intubation, initial encounter: T88.4XXA

## 2016-06-11 HISTORY — PX: PARATHYROIDECTOMY: SHX19

## 2016-06-11 SURGERY — PARATHYROIDECTOMY
Anesthesia: General | Site: Neck | Laterality: Left

## 2016-06-11 MED ORDER — FENTANYL CITRATE (PF) 100 MCG/2ML IJ SOLN
INTRAMUSCULAR | Status: DC | PRN
  Administered 2016-06-11 (×2): 100 ug via INTRAVENOUS

## 2016-06-11 MED ORDER — ROCURONIUM BROMIDE 50 MG/5ML IV SOSY
PREFILLED_SYRINGE | INTRAVENOUS | Status: AC
  Filled 2016-06-11: qty 5

## 2016-06-11 MED ORDER — FENTANYL CITRATE (PF) 250 MCG/5ML IJ SOLN
INTRAMUSCULAR | Status: AC
  Filled 2016-06-11: qty 5

## 2016-06-11 MED ORDER — ONDANSETRON HCL 4 MG/2ML IJ SOLN
INTRAMUSCULAR | Status: AC
  Filled 2016-06-11: qty 2

## 2016-06-11 MED ORDER — SODIUM CHLORIDE 0.9 % IV SOLN
INTRAVENOUS | Status: DC | PRN
  Administered 2016-06-11: 07:00:00 via INTRAVENOUS

## 2016-06-11 MED ORDER — SUGAMMADEX SODIUM 200 MG/2ML IV SOLN
INTRAVENOUS | Status: DC | PRN
  Administered 2016-06-11: 400 mg via INTRAVENOUS

## 2016-06-11 MED ORDER — MIDAZOLAM HCL 2 MG/2ML IJ SOLN
INTRAMUSCULAR | Status: DC | PRN
  Administered 2016-06-11: 2 mg via INTRAVENOUS

## 2016-06-11 MED ORDER — BUPIVACAINE HCL 0.25 % IJ SOLN
INTRAMUSCULAR | Status: DC | PRN
  Administered 2016-06-11: 15 mL

## 2016-06-11 MED ORDER — HYDROCODONE-ACETAMINOPHEN 5-325 MG PO TABS
1.0000 | ORAL_TABLET | Freq: Once | ORAL | Status: AC
  Administered 2016-06-11: 1 via ORAL
  Filled 2016-06-11: qty 1

## 2016-06-11 MED ORDER — PROPOFOL 10 MG/ML IV BOLUS
INTRAVENOUS | Status: DC | PRN
  Administered 2016-06-11: 50 mg via INTRAVENOUS
  Administered 2016-06-11: 150 mg via INTRAVENOUS

## 2016-06-11 MED ORDER — LIDOCAINE 2% (20 MG/ML) 5 ML SYRINGE
INTRAMUSCULAR | Status: DC | PRN
  Administered 2016-06-11: 100 mg via INTRAVENOUS

## 2016-06-11 MED ORDER — HYDROMORPHONE HCL 2 MG/ML IJ SOLN
0.2500 mg | INTRAMUSCULAR | Status: DC | PRN
Start: 2016-06-11 — End: 2016-06-11

## 2016-06-11 MED ORDER — ROCURONIUM BROMIDE 10 MG/ML (PF) SYRINGE
PREFILLED_SYRINGE | INTRAVENOUS | Status: DC | PRN
Start: 2016-06-11 — End: 2016-06-11
  Administered 2016-06-11: 40 mg via INTRAVENOUS

## 2016-06-11 MED ORDER — DEXAMETHASONE SODIUM PHOSPHATE 10 MG/ML IJ SOLN
INTRAMUSCULAR | Status: AC
  Filled 2016-06-11: qty 1

## 2016-06-11 MED ORDER — CHLORHEXIDINE GLUCONATE CLOTH 2 % EX PADS: 6.0000 | MEDICATED_PAD | Freq: Once | CUTANEOUS | Status: DC

## 2016-06-11 MED ORDER — BUPIVACAINE HCL (PF) 0.25 % IJ SOLN
INTRAMUSCULAR | Status: AC
  Filled 2016-06-11: qty 30

## 2016-06-11 MED ORDER — SUGAMMADEX SODIUM 500 MG/5ML IV SOLN
INTRAVENOUS | Status: AC
  Filled 2016-06-11: qty 5

## 2016-06-11 MED ORDER — 0.9 % SODIUM CHLORIDE (POUR BTL) OPTIME
TOPICAL | Status: DC | PRN
  Administered 2016-06-11: 1000 mL

## 2016-06-11 MED ORDER — PROPOFOL 10 MG/ML IV BOLUS
INTRAVENOUS | Status: AC
  Filled 2016-06-11: qty 20

## 2016-06-11 MED ORDER — MIDAZOLAM HCL 2 MG/2ML IJ SOLN
INTRAMUSCULAR | Status: AC
  Filled 2016-06-11: qty 2

## 2016-06-11 MED ORDER — DEXAMETHASONE SODIUM PHOSPHATE 10 MG/ML IJ SOLN
INTRAMUSCULAR | Status: DC | PRN
  Administered 2016-06-11: 10 mg via INTRAVENOUS

## 2016-06-11 MED ORDER — SUCCINYLCHOLINE CHLORIDE 200 MG/10ML IV SOSY
PREFILLED_SYRINGE | INTRAVENOUS | Status: DC | PRN
  Administered 2016-06-11: 140 mg via INTRAVENOUS
  Administered 2016-06-11: 60 mg via INTRAVENOUS

## 2016-06-11 MED ORDER — SUCCINYLCHOLINE CHLORIDE 200 MG/10ML IV SOSY
PREFILLED_SYRINGE | INTRAVENOUS | Status: AC
  Filled 2016-06-11: qty 10

## 2016-06-11 MED ORDER — SUGAMMADEX SODIUM 200 MG/2ML IV SOLN
INTRAVENOUS | Status: AC
  Filled 2016-06-11: qty 2

## 2016-06-11 MED ORDER — HYDROCODONE-ACETAMINOPHEN 5-325 MG PO TABS: 1.0000 | ORAL_TABLET | ORAL | 0 refills | Status: DC | PRN

## 2016-06-11 MED ORDER — LIDOCAINE 2% (20 MG/ML) 5 ML SYRINGE
INTRAMUSCULAR | Status: AC
  Filled 2016-06-11: qty 5

## 2016-06-11 MED ORDER — PROMETHAZINE HCL 25 MG/ML IJ SOLN: 6.2500 mg | INTRAMUSCULAR | Status: DC | PRN

## 2016-06-11 MED ORDER — EPHEDRINE SULFATE-NACL 50-0.9 MG/10ML-% IV SOSY
PREFILLED_SYRINGE | INTRAVENOUS | Status: DC | PRN
  Administered 2016-06-11 (×2): 10 mg via INTRAVENOUS

## 2016-06-11 MED FILL — HYDROCODON-APAP 5-325: 5-325 | 2 days supply | Qty: 20 | Fill #0

## 2016-06-11 SURGICAL SUPPLY — 38 items
ADH SKN CLS APL DERMABOND .7 (GAUZE/BANDAGES/DRESSINGS)
ATTRACTOMAT 16X20 MAGNETIC DRP (DRAPES) ×3 IMPLANT
BLADE HEX COATED 2.75 (ELECTRODE) ×3 IMPLANT
BLADE SURG 15 STRL LF DISP TIS (BLADE) ×1 IMPLANT
BLADE SURG 15 STRL SS (BLADE) ×3
CHLORAPREP W/TINT 26ML (MISCELLANEOUS) ×4 IMPLANT
CLIP TI MEDIUM 6 (CLIP) ×6 IMPLANT
CLIP TI WIDE RED SMALL 6 (CLIP) ×6 IMPLANT
CLOSURE WOUND 1/2 X4 (GAUZE/BANDAGES/DRESSINGS) ×1
DERMABOND ADVANCED (GAUZE/BANDAGES/DRESSINGS)
DERMABOND ADVANCED .7 DNX12 (GAUZE/BANDAGES/DRESSINGS) IMPLANT
DRAPE LAPAROTOMY T 98X78 PEDS (DRAPES) ×3 IMPLANT
ELECT PENCIL ROCKER SW 15FT (MISCELLANEOUS) ×3 IMPLANT
ELECT REM PT RETURN 15FT ADLT (MISCELLANEOUS) ×3 IMPLANT
GAUZE SPONGE 4X4 12PLY STRL (GAUZE/BANDAGES/DRESSINGS) ×2 IMPLANT
GAUZE SPONGE 4X4 16PLY XRAY LF (GAUZE/BANDAGES/DRESSINGS) ×3 IMPLANT
GLOVE SURG ORTHO 8.0 STRL STRW (GLOVE) ×3 IMPLANT
GOWN STRL REUS W/TWL XL LVL3 (GOWN DISPOSABLE) ×9 IMPLANT
HEMOSTAT SURGICEL 2X4 FIBR (HEMOSTASIS) ×3 IMPLANT
ILLUMINATOR WAVEGUIDE N/F (MISCELLANEOUS) ×2 IMPLANT
KIT BASIN OR (CUSTOM PROCEDURE TRAY) ×3 IMPLANT
LIGHT WAVEGUIDE WIDE FLAT (MISCELLANEOUS) IMPLANT
NDL HYPO 25X1 1.5 SAFETY (NEEDLE) ×1 IMPLANT
NEEDLE HYPO 25X1 1.5 SAFETY (NEEDLE) ×3 IMPLANT
PACK BASIC VI WITH GOWN DISP (CUSTOM PROCEDURE TRAY) ×3 IMPLANT
STAPLER VISISTAT 35W (STAPLE) ×1 IMPLANT
STRIP CLOSURE SKIN 1/2X4 (GAUZE/BANDAGES/DRESSINGS) ×1 IMPLANT
SUT MNCRL AB 4-0 PS2 18 (SUTURE) ×3 IMPLANT
SUT SILK 2 0 (SUTURE)
SUT SILK 2-0 18XBRD TIE 12 (SUTURE) IMPLANT
SUT SILK 3 0 (SUTURE)
SUT SILK 3-0 18XBRD TIE 12 (SUTURE) IMPLANT
SUT VIC AB 3-0 SH 18 (SUTURE) ×3 IMPLANT
SYR BULB IRRIGATION 50ML (SYRINGE) ×3 IMPLANT
SYR CONTROL 10ML LL (SYRINGE) ×3 IMPLANT
TOWEL OR 17X26 10 PK STRL BLUE (TOWEL DISPOSABLE) ×3 IMPLANT
TOWEL OR NON WOVEN STRL DISP B (DISPOSABLE) ×3 IMPLANT
YANKAUER SUCT BULB TIP 10FT TU (MISCELLANEOUS) ×3 IMPLANT

## 2016-06-11 NOTE — Op Note (Signed)
OPERATIVE REPORT - PARATHYROIDECTOMY  Preoperative diagnosis: Primary hyperparathyroidism  Postop diagnosis: Same  Procedure: Left superior minimally invasive parathyroidectomy  Surgeon:  Earnstine Regal, MD, FACS  Anesthesia: Gen. endotracheal  Estimated blood loss: Minimal  Preparation: ChloraPrep  Indications: The patient is a 66 year old female who presents with a thyroid nodule.  Patient is referred by Dr. Delrae Rend for evaluation of primary hyperparathyroidism and bilateral thyroid nodules. Patient's primary care physician is Dr. Harlan Stains. Patient has a family history of thyroid cancer in her mother and her sister. Both of them had surgery. Patient also has a sister with hypercalcemia who is being evaluated for possible parathyroid disease. On evaluation, the patient was noted to have mildly elevated serum calcium levels ranging from 10.5-10.7. Vitamin D level was normal at 33.9 after treatment. Intact PTH level was mildly elevated at 82. Patient had 24-hour urine collection performed which was elevated at 410.  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  Procedure: Patient was prepared in the holding area. He was brought to operating room and placed in a supine position on the operating room table. Following administration of general anesthesia, the patient was positioned and then prepped and draped in the usual strict aseptic fashion. After ascertaining that an adequate level of anesthesia been achieved, a neck incision was made with a #15 blade. Dissection was carried through subcutaneous tissues and platysma. Hemostasis was obtained with the electrocautery. Skin flaps were developed circumferentially and a Weitlander retractor was placed for exposure.  Strap muscles were incised in the midline. Strap muscles were reflected exposing the thyroid lobe. With gentle blunt dissection the thyroid lobe was mobilized.  Dissection was carried through adipose tissue and an  enlarged parathyroid gland was identified. It was gently mobilized. Vascular structures were divided between small and medium ligaclips. Care was taken to avoid the recurrent laryngeal nerve and the esophagus. The parathyroid gland was completely excised. It was submitted to pathology where frozen section confirmed parathyroid tissue consistent with adenoma.  On further exploration, there was a normal left inferior parathyroid gland identified and left in situ.  Neck was irrigated with warm saline and good hemostasis was noted. Fibrillar was placed in the operative field. Strap muscles were reapproximated in the midline with interrupted 3-0 Vicryl sutures. Platysma was closed with interrupted 3-0 Vicryl sutures. Skin was closed with a running 4-0 Monocryl subcuticular suture. Marcaine was infiltrated circumferentially. Wound was washed and dried and Dermabond was applied. Patient was awakened from anesthesia and brought to the recovery room. The patient tolerated the procedure well.   Earnstine Regal, MD, [facility]

## 2016-06-11 NOTE — Anesthesia Procedure Notes (Signed)
Procedure Name: Intubation Date/Time: 06/11/2016 7:35 AM Performed by: Rica Koyanagi Oxygen Delivery Method: Circle system utilized Preoxygenation: Pre-oxygenation with 100% oxygen Intubation Type: IV induction Ventilation: Mask ventilation without difficulty and Oral airway inserted - appropriate to patient size Laryngoscope Size: Glidescope and 4 Grade View: Grade II Tube type: Oral Tube size: 7.0 mm Number of attempts: 3 Airway Equipment and Method: Video-laryngoscopy Placement Confirmation: ETT inserted through vocal cords under direct vision,  positive ETCO2 and breath sounds checked- equal and bilateral Secured at: 22 cm Tube secured with: Tape Dental Injury: Bloody posterior oropharynx  Difficulty Due To: Difficulty was anticipated, Difficult Airway- due to large tongue, Difficult Airway- due to anterior larynx and Difficult Airway- due to limited oral opening Future Recommendations: Recommend- induction with short-acting agent, and alternative techniques readily available Comments: Attempted laryngoscopy with Miller 2 and unable to visualize cords.  Tissue noted dependent of tracheal opening that blocked opening.  Unable to visualize with MAC 4.  Proceeded to glidescope.  Cords visualized anteriorly.

## 2016-06-11 NOTE — Anesthesia Preprocedure Evaluation (Signed)
Anesthesia Evaluation  Patient identified by MRN, date of birth, ID band  Reviewed: Allergy & Precautions, NPO status , Patient's Chart, lab work & pertinent test results  Airway Mallampati: II  TM Distance: >3 FB Neck ROM: Full    Dental   Pulmonary asthma , former smoker,    breath sounds clear to auscultation       Cardiovascular + Peripheral Vascular Disease   Rhythm:Regular Rate:Normal     Neuro/Psych    GI/Hepatic negative GI ROS, Neg liver ROS,   Endo/Other  Morbid obesityhyperparathyroidism  Renal/GU negative Renal ROS     Musculoskeletal  (+) Arthritis ,   Abdominal   Peds  Hematology   Anesthesia Other Findings   Reproductive/Obstetrics                             Anesthesia Physical Anesthesia Plan  ASA: II  Anesthesia Plan: General   Post-op Pain Management:    Induction: Intravenous  Airway Management Planned: Oral ETT  Additional Equipment:   Intra-op Plan:   Post-operative Plan: Extubation in OR  Informed Consent: I have reviewed the patients History and Physical, chart, labs and discussed the procedure including the risks, benefits and alternatives for the proposed anesthesia with the patient or authorized representative who has indicated his/her understanding and acceptance.   Dental advisory given  Plan Discussed with:   Anesthesia Plan Comments:         Anesthesia Quick Evaluation

## 2016-06-11 NOTE — Transfer of Care (Signed)
Immediate Anesthesia Transfer of Care Note  Patient: Tammy Mccoy  Procedure(s) Performed: Procedure(s): LEFT PARATHYROIDECTOMY (Left)  Patient Location: PACU  Anesthesia Type:General  Level of Consciousness: awake, alert  and oriented  Airway & Oxygen Therapy: Patient Spontanous Breathing and Patient connected to face mask oxygen  Post-op Assessment: Report given to RN and Post -op Vital signs reviewed and stable  Post vital signs: Reviewed and stable  Last Vitals:  Vitals:   06/11/16 0557  BP: (!) 142/72  Pulse: 85  Resp: 18  Temp: 36.6 C    Last Pain:  Vitals:   06/11/16 0557  TempSrc: Oral      Patients Stated Pain Goal: 3 (66/81/59 4707)  Complications: No apparent anesthesia complications

## 2016-06-11 NOTE — Anesthesia Postprocedure Evaluation (Addendum)
Anesthesia Post Note  Patient: Tammy Mccoy  Procedure(s) Performed: Procedure(s) (LRB): LEFT PARATHYROIDECTOMY (Left)  Patient location during evaluation: PACU Anesthesia Type: General Level of consciousness: awake, sedated and oriented Pain management: pain level controlled Vital Signs Assessment: post-procedure vital signs reviewed and stable Respiratory status: spontaneous breathing, nonlabored ventilation, respiratory function stable and patient connected to nasal cannula oxygen Cardiovascular status: blood pressure returned to baseline and stable Postop Assessment: no signs of nausea or vomiting Anesthetic complications: no       Last Vitals:  Vitals:   06/11/16 0915 06/11/16 0945  BP: (!) 144/73   Pulse: 80   Resp: (!) 27   Temp:  37.1 C    Last Pain:  Vitals:   06/11/16 0945  TempSrc:   PainSc: 3                  Somnang Mahan,JAMES TERRILL

## 2016-06-11 NOTE — Interval H&P Note (Signed)
History and Physical Interval Note:  06/11/2016 7:06 AM  Tammy Mccoy  has presented today for surgery, with the diagnosis of primary hyperparathyroidism.  The various methods of treatment have been discussed with the patient and family. After consideration of risks, benefits and other options for treatment, the patient has consented to    Procedure(s): LEFT PARATHYROIDECTOMY POSSIBLE NECK EXPLORATION (Left) as a surgical intervention .    The patient's history has been reviewed, patient examined, no change in status, stable for surgery.  I have reviewed the patient's chart and labs.  Questions were answered to the patient's satisfaction.    Earnstine Regal, MD, [facility] Office: [place]

## 2016-07-12 ENCOUNTER — Encounter: Payer: Self-pay | Admitting: Podiatry

## 2016-07-12 ENCOUNTER — Ambulatory Visit (INDEPENDENT_AMBULATORY_CARE_PROVIDER_SITE_OTHER): Payer: BLUE CROSS/BLUE SHIELD | Admitting: Podiatry

## 2016-07-12 DIAGNOSIS — M7662 Achilles tendinitis, left leg: Secondary | ICD-10-CM | POA: Diagnosis not present

## 2016-07-12 MED ORDER — TRAMADOL HCL 50 MG PO TABS: 50.0000 mg | ORAL_TABLET | Freq: Three times a day (TID) | ORAL | 2 refills | Status: DC

## 2016-07-13 NOTE — Progress Notes (Signed)
Subjective:    Patient ID: Tammy Mccoy, female   DOB: 66 y.o.   MRN: [id number]   HPI patient states she still having a lot of pain in the back of her heel and states that the shockwave does not seem to have solve the problem with mild reduction of discomfort    ROS      Objective:  Physical Exam Neurovascular status intact with edema around the left posterior heel that has improved slightly but is still very tender at the insertion to the calcaneus    Assessment:     Chronic Achilles tendinitis    Plan:    Try to do anything we can avoid surgery and I reviewed with her different ice modalities and also as she has a lot of pain when she sits or sleeps I dispensed night splint to use with ice. I then sending her for physical therapy to try to reduce the inflammatory process

## 2016-07-16 ENCOUNTER — Ambulatory Visit: Payer: BLUE CROSS/BLUE SHIELD | Admitting: Podiatry

## 2016-07-30 NOTE — Addendum Note (Signed)
Addendum  created 07/30/16 1344 by Rica Koyanagi, MD   Sign clinical note

## 2016-08-09 ENCOUNTER — Ambulatory Visit: Payer: BLUE CROSS/BLUE SHIELD | Admitting: Podiatry

## 2016-08-19 ENCOUNTER — Encounter: Payer: Self-pay | Admitting: Vascular Surgery

## 2016-08-24 ENCOUNTER — Encounter (HOSPITAL_COMMUNITY): Payer: BLUE CROSS/BLUE SHIELD

## 2016-08-24 ENCOUNTER — Ambulatory Visit: Payer: BLUE CROSS/BLUE SHIELD | Admitting: Vascular Surgery

## 2016-08-30 ENCOUNTER — Ambulatory Visit (INDEPENDENT_AMBULATORY_CARE_PROVIDER_SITE_OTHER): Payer: BLUE CROSS/BLUE SHIELD | Admitting: Vascular Surgery

## 2016-08-30 ENCOUNTER — Ambulatory Visit (HOSPITAL_COMMUNITY)
Admission: RE | Admit: 2016-08-30 | Discharge: 2016-08-30 | Disposition: A | Discharge status: Home / Self Care | Payer: BLUE CROSS/BLUE SHIELD | Source: Ambulatory Visit | Attending: Vascular Surgery | Admitting: Vascular Surgery

## 2016-08-30 ENCOUNTER — Encounter: Payer: Self-pay | Admitting: Vascular Surgery

## 2016-08-30 VITALS — BP 132/84 | HR 82 | Temp 97.3°F | Resp 20 | Ht 65.0 in | Wt 284.0 lb

## 2016-08-30 DIAGNOSIS — I872 Venous insufficiency (chronic) (peripheral): Secondary | ICD-10-CM | POA: Insufficient documentation

## 2016-08-30 DIAGNOSIS — I83892 Varicose veins of left lower extremities with other complications: Secondary | ICD-10-CM | POA: Diagnosis not present

## 2016-08-30 NOTE — Progress Notes (Signed)
Subjective:     Patient ID: Tammy Mccoy, female   DOB: October 24, 1950, 65 y.o.   MRN: [id number]  HPI This 66 year old female returns for 3 month follow-up regarding her painful varicosities and swelling in the left lower extremity. She was evaluated initially in 2009 for this problem but was unable to have a procedure performed at that time and she returns 3 months ago. Her symptoms have worsened with worsening of the varicosities in her thigh and calf area. She is tried long-leg elastic compression stockings 20-30 millimeter gradient as well as elevation and ibuprofen without success. These symptoms are affecting her daily living and she would like treatment  Past Medical History:  Diagnosis Date  . Anxiety   . Arthritis    bil knees and neck  . Asthma    excersice  . Claustrophobia   . Claustrophobia   . Depression   . Difficult intubation 06/11/2016   glidescope grade 2  . Family history of adverse reaction to anesthesia    sister has vomiting  . H/O rosacea   . Multiple thyroid nodules   . Skin cancer, basal cell   . Sleep apnea    no cpap  . Squamous cell skin cancer     Social History  Substance Use Topics  . Smoking status: Former Smoker    Packs/day: 1.00    Years: 35.00    Types: Cigarettes    Quit date: 03/01/2008  . Smokeless tobacco: Never Used  . Alcohol use No     Comment: past    Family History  Problem Relation Age of Onset  . Cancer Father        Bladder  . CVA Mother 92    Allergies  Allergen Reactions  . Bupropion     Worsened depression   . Ibuprofen Itching    Lower lip swelling      Current Outpatient Prescriptions:  .  acetaminophen (TYLENOL) 650 MG CR tablet, Take 1,300 mg by mouth daily as needed for pain., Disp: , Rfl:  .  ALPRAZolam (XANAX) 0.5 MG tablet, Take 0.25-0.5 mg by mouth at bedtime as needed for sleep. , Disp: , Rfl:  .  Carboxymethylcellulose Sodium (THERATEARS OP), Apply 1 drop to eye daily as needed (dry eyes).,  Disp: , Rfl:  .  chlorpheniramine (CHLOR-TRIMETON) 4 MG tablet, Take 4 mg by mouth daily as needed for allergies., Disp: , Rfl:  .  Cholecalciferol (VITAMIN D3) 2000 units capsule, Take 2,000 Units by mouth daily., Disp: , Rfl:  .  FLUoxetine (PROZAC) 10 MG capsule, Take 30 mg by mouth daily. , Disp: , Rfl:  .  fluticasone (FLONASE) 50 MCG/ACT nasal spray, Place 1 spray into both nostrils daily. , Disp: , Rfl:  .  hydrocortisone 2.5 % cream, Apply 1 application topically 2 (two) times daily as needed (itching). Inside ears, Disp: , Rfl:  .  Multiple Vitamin (MULTIVITAMIN WITH MINERALS) TABS tablet, Take 1 tablet by mouth 2 (two) times a week., Disp: , Rfl:  .  naproxen (NAPROSYN) 500 MG tablet, Take 500 mg by mouth 2 (two) times daily as needed for mild pain. , Disp: , Rfl:  .  naproxen sodium (ANAPROX) 220 MG tablet, Take 220 mg by mouth daily as needed (pain)., Disp: , Rfl:  .  ranitidine (ZANTAC) 150 MG tablet, Take 150 mg by mouth daily as needed for heartburn., Disp: , Rfl:  .  Loteprednol-Tobramycin (ZYLET OP), Apply 1 drop to eye daily as needed (allergies)., Disp: ,  Rfl:  .  NONFORMULARY OR COMPOUNDED ITEM, Apply 1-2 g topically 4 (four) times daily. (Patient not taking: Reported on 08/30/2016), Disp: 120 each, Rfl: 2 .  traMADol (ULTRAM) 50 MG tablet, Take 1 tablet (50 mg total) by mouth 3 (three) times daily. (Patient not taking: Reported on 08/30/2016), Disp: 90 tablet, Rfl: 2  Current Facility-Administered Medications:  .  betamethasone acetate-betamethasone sodium phosphate (CELESTONE) injection 3 mg, 3 mg, Intramuscular, Once, Edrick Kins, DPM  Vitals:   08/30/16 1337 08/30/16 1344  BP: (!) 160/85 132/84  Pulse: 82   Resp: 20   Temp: 97.3 F (36.3 C)   TempSrc: Oral   SpO2: 96%   Weight: 284 lb (128.8 kg)   Height: 5\' 5"  (1.651 m)     Body mass index is 47.26 kg/m.        Review of Systems Denies chest pain, dyspnea on exertion, PND, orthopnea, hemoptysis. Does  have chronic obesity.    Objective:   Physical Exam BP 132/84 (BP Location: Right Leg, Patient Position: Sitting, Cuff Size: Large)   Pulse 82   Temp 97.3 F (36.3 C) (Oral)   Resp 20   Ht 5\' 5"  (1.651 m)   Wt 284 lb (128.8 kg)   SpO2 96%   BMI 47.26 kg/m   Gen. obese female no apparent distress alert and oriented 3 Lungs no rhonchi or wheezing Left leg with bulging varicosities beginning in the mid medial thigh extending into the medial calf and knee area with 1+ chronic edema. No hyperpigmentation or ulceration noted. 2+ dorsalis pedis pulse palpable.  Today I ordered a venous duplex exam the left leg which I reviewed and interpreted. There is no DVT. There is gross reflux throughout the left great saphenous vein which is significantly enlarged) supplying these painful varicosities in the thigh and calf.  Also performed a bedside SonoSite ultrasound exam confirming these findings     Assessment:     Painful varicosities and swelling left leg due to gross reflux left great saphenous vein causing symptoms which are resistant to conservative measures including long-leg elastic compression stockings 20-30 millimeter gradient, elevation, and analgesics.    Plan:     Patient needs laser ablation left great saphenous vein plus greater than 20 stab phlebectomy of painful varicosities to be performed as a single procedure We will proceed with precertification to perform this procedure in the near future to relieve this patient's symptoms

## 2016-09-16 ENCOUNTER — Telehealth: Payer: Self-pay | Admitting: Podiatry

## 2016-09-16 NOTE — Telephone Encounter (Signed)
Raquel Sarna with physical therapy and hand was calling to see if we received note for Dr. Paulla Dolly to sign from date of 21 Jul 2016.

## 2016-09-29 ENCOUNTER — Other Ambulatory Visit: Payer: Self-pay | Admitting: *Deleted

## 2016-09-29 DIAGNOSIS — I83892 Varicose veins of left lower extremities with other complications: Secondary | ICD-10-CM

## 2016-11-02 ENCOUNTER — Ambulatory Visit
Admission: RE | Admit: 2016-11-02 | Discharge: 2016-11-02 | Disposition: A | Discharge status: Home / Self Care | Payer: BLUE CROSS/BLUE SHIELD | Source: Ambulatory Visit | Attending: Family Medicine | Admitting: Family Medicine

## 2016-11-02 ENCOUNTER — Other Ambulatory Visit: Payer: Self-pay | Admitting: Family Medicine

## 2016-11-02 DIAGNOSIS — M545 Low back pain, unspecified: Secondary | ICD-10-CM

## 2016-11-08 ENCOUNTER — Telehealth: Payer: Self-pay | Admitting: *Deleted

## 2016-11-08 NOTE — Telephone Encounter (Signed)
Left telephone message with Mrs. Vidrine that per Dr. Kellie Simmering that taking prednisone would not interfere or prevent her laser ablation scheduled for 11-16-2016 from taking place. Asked that she call me if she had questions/concerns.

## 2016-11-16 ENCOUNTER — Ambulatory Visit (INDEPENDENT_AMBULATORY_CARE_PROVIDER_SITE_OTHER): Payer: BLUE CROSS/BLUE SHIELD | Admitting: Vascular Surgery

## 2016-11-16 ENCOUNTER — Encounter: Payer: Self-pay | Admitting: Vascular Surgery

## 2016-11-16 VITALS — BP 123/71 | HR 82 | Temp 99.0°F | Resp 18 | Ht 65.0 in | Wt 284.0 lb

## 2016-11-16 DIAGNOSIS — I83892 Varicose veins of left lower extremities with other complications: Secondary | ICD-10-CM | POA: Diagnosis not present

## 2016-11-16 DIAGNOSIS — I868 Varicose veins of other specified sites: Secondary | ICD-10-CM

## 2016-11-16 HISTORY — PX: ENDOVENOUS ABLATION SAPHENOUS VEIN W/ LASER: SUR449

## 2016-11-16 NOTE — Progress Notes (Signed)
Laser Ablation Procedure    Date: 11/16/2016   Tammy Mccoy DOB:[birth year]  Consent signed: Yes    Surgeon:  Dr. Nelda Severe. Kellie Simmering  Procedure: Laser Ablation: left Greater Saphenous Vein  BP 123/71 (BP Location: Left Arm, Patient Position: Sitting, Cuff Size: Large)   Pulse 82   Temp 99 F (37.2 C) (Oral)   Resp 18   Ht 5\' 5"  (1.651 m)   Wt 284 lb (128.8 kg)   SpO2 94%   BMI 47.26 kg/m   Tumescent Anesthesia: 425 cc 0.9% NaCl with 50 cc Lidocaine HCL with 1% Epi and 15 cc 8.4% NaHCO3  Local Anesthesia: 6 cc Lidocaine HCL and NaHCO3 (ratio 2:1)  Pulsed Mode: 15 watts, 532ms delay, 1.0 duration  Total Energy:  1509 Joules            Total Pulses:  101              Total Time: 1:41   Stab Phlebectomy: >20 Sites: Thigh and Calf  Patient tolerated procedure well    Description of Procedure:  After marking the course of the secondary varicosities, the patient was placed on the operating table in the supine position, and the left leg was prepped and draped in sterile fashion.   Local anesthetic was administered and under ultrasound guidance the saphenous vein was accessed with a micro needle and guide wire; then the mirco puncture sheath was placed.  A guide wire was inserted saphenofemoral junction , followed by a 5 french sheath.  The position of the sheath and then the laser fiber below the junction was confirmed using the ultrasound.  Tumescent anesthesia was administered along the course of the saphenous vein using ultrasound guidance. The patient was placed in Trendelenburg position and protective laser glasses were placed on patient and staff, and the laser was fired at 15 watts continuous mode advancing 1-38mm/second for a total of 1509 joules.   For stab phlebectomies, local anesthetic was administered at the previously marked varicosities, and tumescent anesthesia was administered around the vessels.  Greater than 20 stab wounds were made using the tip of an 11 blade. And  using the vein hook, the phlebectomies were performed using a hemostat to avulse the varicosities.  Adequate hemostasis was achieved.     Steri strips were applied to the stab wounds and ABD pads and thigh high compression stockings were applied.  Ace wrap bandages were applied over the phlebectomy sites and at the top of the saphenofemoral junction. Blood loss was less than 15 cc.  The patient ambulated out of the operating room having tolerated the procedure well.

## 2016-11-16 NOTE — Progress Notes (Signed)
Subjective:     Patient ID: Tammy Mccoy, female   DOB: Sep 12, 1950, 66 y.o.   MRN: [id number]  HPI This 66 year old female had laser ablation left great saphenous vein from the distal thigh to near the saphenofemoral junction plus greater than 20 stab phlebectomy of painful varicosities performed under local tumescent anesthesia. A total of 1509 J of energy was utilized. She tolerated the procedures well.  Review of Systems     Objective:   Physical Exam BP 123/71 (BP Location: Left Arm, Patient Position: Sitting, Cuff Size: Large)   Pulse 82   Temp 99 F (37.2 C) (Oral)   Resp 18   Ht 5\' 5"  (1.651 m)   Wt 284 lb (128.8 kg)   SpO2 94%   BMI 47.26 kg/m        Assessment:     Well-tolerated laser ablation left great saphenous vein plus greater than 20 stab phlebectomy of painful varicosities performed under local tumescent anesthesia    Plan:     Return in 1 week for venous duplex exam to confirm closure left great saphenous vein and this will complete patient's treatment regimen

## 2016-11-17 ENCOUNTER — Encounter: Payer: Self-pay | Admitting: Vascular Surgery

## 2016-11-22 ENCOUNTER — Ambulatory Visit: Payer: BLUE CROSS/BLUE SHIELD | Admitting: Vascular Surgery

## 2016-11-22 ENCOUNTER — Ambulatory Visit (HOSPITAL_COMMUNITY)
Admission: RE | Admit: 2016-11-22 | Discharge: 2016-11-22 | Disposition: A | Discharge status: Home / Self Care | Payer: BLUE CROSS/BLUE SHIELD | Source: Ambulatory Visit | Attending: Vascular Surgery | Admitting: Vascular Surgery

## 2016-11-22 DIAGNOSIS — I82812 Embolism and thrombosis of superficial veins of left lower extremities: Secondary | ICD-10-CM | POA: Insufficient documentation

## 2016-11-22 DIAGNOSIS — I83892 Varicose veins of left lower extremities with other complications: Secondary | ICD-10-CM | POA: Diagnosis not present

## 2016-11-23 ENCOUNTER — Telehealth: Payer: Self-pay | Admitting: *Deleted

## 2016-11-23 NOTE — Telephone Encounter (Signed)
Mrs. Tammy Mccoy is s/p endovenous laser ablation L GSV and stab phlebectomy > 20 incisions L leg on 11-16-2016 by Tinnie Gens MD.  Mrs. Tammy Mccoy states she has pain left inner thigh near groin.  She says she has difficulty keeping the thigh high compression stockings up and is using Tylenol for pain.  Explained that pain is caused by inflammation and Tylenol would not relieve inflammatory pain.  Recommended taking an anti-inflammatory medication. Mrs. Tammy Mccoy cannot take Ibuprofen but is able to take Naprosyn.  Mrs. Tammy Mccoy also states she is purchasing pantyhose style compression hose since she is having difficulty with thigh high hose rolling down.  Mrs. Tammy Mccoy requests information about results of her venous duplex yesterday.  Reviewed results of venous duplex (left leg) with her. Left saphenous vein is occluded and no DVT is noted.  Mrs. Tammy Mccoy aware of her follow up visit scheduled with Dr. Kellie Simmering on 11-29-2016.

## 2016-11-29 ENCOUNTER — Encounter: Payer: Self-pay | Admitting: Vascular Surgery

## 2016-11-29 ENCOUNTER — Ambulatory Visit (INDEPENDENT_AMBULATORY_CARE_PROVIDER_SITE_OTHER): Payer: Self-pay | Admitting: Vascular Surgery

## 2016-11-29 VITALS — BP 161/85 | HR 90 | Temp 98.0°F | Resp 18 | Ht 65.0 in | Wt 307.0 lb

## 2016-11-29 DIAGNOSIS — I83891 Varicose veins of right lower extremities with other complications: Secondary | ICD-10-CM

## 2016-11-29 NOTE — Progress Notes (Signed)
Subjective:     Patient ID: Tammy Mccoy, female   DOB: April 25, 1950, 66 y.o.   MRN: [id number]  HPI This 66 year old female returns 1 week post-laser ablation of the left great saphenous vein with multiple stab phlebectomy of painful varicosities. She states that she's had no change in her distal edema. There is no pain in the stab phlebectomy sites. She's had some mild discomfort along the course of the great saphenous vein which is rapidly improving. She has worn her elastic compression stocking and taken ibuprofen as instructed.  Review of Systems     Objective:   Physical Exam BP (!) 161/85 Comment: recheck  Pulse 90   Temp 98 F (36.7 C) (Oral)   Resp 18   Ht 5\' 5"  (1.651 m)   Wt (!) 307 lb (139.3 kg)   SpO2 96%   BMI 51.09 kg/m   Gen. obese female no apparent distress alert and oriented 3 Left leg with mild tenderness to deep palpation over great saphenous vein in mid to proximal thigh. Stab phlebectomy sites in the thigh and calf are healing nicely.  Today I ordered a venous duplex exam of the left leg which I reviewed and interpreted. There is no DVT. There is total closure of the left great saphenous vein up to near the saphenofemoral junction     Assessment:     Successful laser ablation left great saphenous vein for gross reflux with painful varicosities and multiple stab phlebectomy    Plan:     Doing well Return to see me on a when necessary basis

## 2016-12-03 DIAGNOSIS — M5136 Other intervertebral disc degeneration, lumbar region: Secondary | ICD-10-CM | POA: Diagnosis not present

## 2016-12-03 DIAGNOSIS — N3281 Overactive bladder: Secondary | ICD-10-CM | POA: Diagnosis not present

## 2016-12-03 DIAGNOSIS — F325 Major depressive disorder, single episode, in full remission: Secondary | ICD-10-CM | POA: Diagnosis not present

## 2016-12-14 DIAGNOSIS — M549 Dorsalgia, unspecified: Secondary | ICD-10-CM | POA: Diagnosis not present

## 2016-12-14 DIAGNOSIS — M5416 Radiculopathy, lumbar region: Secondary | ICD-10-CM | POA: Diagnosis not present

## 2016-12-14 DIAGNOSIS — R262 Difficulty in walking, not elsewhere classified: Secondary | ICD-10-CM | POA: Diagnosis not present

## 2016-12-14 DIAGNOSIS — M545 Low back pain: Secondary | ICD-10-CM | POA: Diagnosis not present

## 2016-12-20 DIAGNOSIS — M545 Low back pain: Secondary | ICD-10-CM | POA: Diagnosis not present

## 2016-12-20 DIAGNOSIS — M549 Dorsalgia, unspecified: Secondary | ICD-10-CM | POA: Diagnosis not present

## 2016-12-20 DIAGNOSIS — R262 Difficulty in walking, not elsewhere classified: Secondary | ICD-10-CM | POA: Diagnosis not present

## 2016-12-20 DIAGNOSIS — M5416 Radiculopathy, lumbar region: Secondary | ICD-10-CM | POA: Diagnosis not present

## 2016-12-22 DIAGNOSIS — M549 Dorsalgia, unspecified: Secondary | ICD-10-CM | POA: Diagnosis not present

## 2016-12-22 DIAGNOSIS — M545 Low back pain: Secondary | ICD-10-CM | POA: Diagnosis not present

## 2016-12-22 DIAGNOSIS — M5416 Radiculopathy, lumbar region: Secondary | ICD-10-CM | POA: Diagnosis not present

## 2016-12-22 DIAGNOSIS — R262 Difficulty in walking, not elsewhere classified: Secondary | ICD-10-CM | POA: Diagnosis not present

## 2016-12-27 DIAGNOSIS — M549 Dorsalgia, unspecified: Secondary | ICD-10-CM | POA: Diagnosis not present

## 2016-12-27 DIAGNOSIS — R262 Difficulty in walking, not elsewhere classified: Secondary | ICD-10-CM | POA: Diagnosis not present

## 2016-12-27 DIAGNOSIS — M5416 Radiculopathy, lumbar region: Secondary | ICD-10-CM | POA: Diagnosis not present

## 2016-12-27 DIAGNOSIS — M545 Low back pain: Secondary | ICD-10-CM | POA: Diagnosis not present

## 2016-12-29 DIAGNOSIS — M545 Low back pain: Secondary | ICD-10-CM | POA: Diagnosis not present

## 2016-12-29 DIAGNOSIS — M5416 Radiculopathy, lumbar region: Secondary | ICD-10-CM | POA: Diagnosis not present

## 2016-12-29 DIAGNOSIS — R262 Difficulty in walking, not elsewhere classified: Secondary | ICD-10-CM | POA: Diagnosis not present

## 2016-12-29 DIAGNOSIS — M549 Dorsalgia, unspecified: Secondary | ICD-10-CM | POA: Diagnosis not present

## 2017-01-04 DIAGNOSIS — M549 Dorsalgia, unspecified: Secondary | ICD-10-CM | POA: Diagnosis not present

## 2017-01-04 DIAGNOSIS — M545 Low back pain: Secondary | ICD-10-CM | POA: Diagnosis not present

## 2017-01-04 DIAGNOSIS — R262 Difficulty in walking, not elsewhere classified: Secondary | ICD-10-CM | POA: Diagnosis not present

## 2017-01-04 DIAGNOSIS — M5416 Radiculopathy, lumbar region: Secondary | ICD-10-CM | POA: Diagnosis not present

## 2017-01-06 DIAGNOSIS — M549 Dorsalgia, unspecified: Secondary | ICD-10-CM | POA: Diagnosis not present

## 2017-01-06 DIAGNOSIS — M5416 Radiculopathy, lumbar region: Secondary | ICD-10-CM | POA: Diagnosis not present

## 2017-01-06 DIAGNOSIS — R262 Difficulty in walking, not elsewhere classified: Secondary | ICD-10-CM | POA: Diagnosis not present

## 2017-01-06 DIAGNOSIS — M545 Low back pain: Secondary | ICD-10-CM | POA: Diagnosis not present

## 2017-01-13 DIAGNOSIS — M5416 Radiculopathy, lumbar region: Secondary | ICD-10-CM | POA: Diagnosis not present

## 2017-01-13 DIAGNOSIS — R262 Difficulty in walking, not elsewhere classified: Secondary | ICD-10-CM | POA: Diagnosis not present

## 2017-01-13 DIAGNOSIS — M545 Low back pain: Secondary | ICD-10-CM | POA: Diagnosis not present

## 2017-01-13 DIAGNOSIS — M549 Dorsalgia, unspecified: Secondary | ICD-10-CM | POA: Diagnosis not present

## 2017-01-18 DIAGNOSIS — M5416 Radiculopathy, lumbar region: Secondary | ICD-10-CM | POA: Diagnosis not present

## 2017-01-18 DIAGNOSIS — M549 Dorsalgia, unspecified: Secondary | ICD-10-CM | POA: Diagnosis not present

## 2017-01-18 DIAGNOSIS — R262 Difficulty in walking, not elsewhere classified: Secondary | ICD-10-CM | POA: Diagnosis not present

## 2017-01-18 DIAGNOSIS — M545 Low back pain: Secondary | ICD-10-CM | POA: Diagnosis not present

## 2017-01-23 DIAGNOSIS — M79605 Pain in left leg: Secondary | ICD-10-CM | POA: Diagnosis not present

## 2017-01-23 DIAGNOSIS — M5430 Sciatica, unspecified side: Secondary | ICD-10-CM | POA: Diagnosis not present

## 2017-01-25 ENCOUNTER — Ambulatory Visit (INDEPENDENT_AMBULATORY_CARE_PROVIDER_SITE_OTHER): Payer: Self-pay | Admitting: Vascular Surgery

## 2017-01-25 ENCOUNTER — Telehealth: Payer: Self-pay | Admitting: *Deleted

## 2017-01-25 ENCOUNTER — Encounter: Payer: Self-pay | Admitting: Vascular Surgery

## 2017-01-25 ENCOUNTER — Other Ambulatory Visit: Payer: Self-pay

## 2017-01-25 ENCOUNTER — Ambulatory Visit (HOSPITAL_COMMUNITY)
Admission: RE | Admit: 2017-01-25 | Discharge: 2017-01-25 | Disposition: A | Discharge status: Home / Self Care | Payer: PPO | Source: Ambulatory Visit | Attending: Vascular Surgery | Admitting: Vascular Surgery

## 2017-01-25 ENCOUNTER — Other Ambulatory Visit: Payer: Self-pay | Admitting: *Deleted

## 2017-01-25 VITALS — BP 140/80 | HR 79 | Temp 98.6°F | Resp 18 | Ht 65.0 in | Wt 303.2 lb

## 2017-01-25 DIAGNOSIS — M7989 Other specified soft tissue disorders: Secondary | ICD-10-CM | POA: Insufficient documentation

## 2017-01-25 DIAGNOSIS — I83892 Varicose veins of left lower extremities with other complications: Secondary | ICD-10-CM

## 2017-01-25 DIAGNOSIS — M79662 Pain in left lower leg: Secondary | ICD-10-CM | POA: Insufficient documentation

## 2017-01-25 NOTE — Telephone Encounter (Signed)
Tammy Mccoy is s/p endovenous laser ablation of L GSV and stab phlebectomy >20 incisions left leg by Tinnie Gens MD on 11-16-2016.   She states she had severe left leg pain behind her left knee on Thanksgiving day that has continued with swelling in her left ankle. She states she went to Urgent care center on Sunday regarding this issue and they suggested and venous duplex and evaluation by Dr. Kellie Simmering.  Appointment for venous duplex left leg scheduled for 9AM today 01-25-2017 with Follow up appointment with Dr. Kellie Simmering to follow.

## 2017-01-25 NOTE — Progress Notes (Signed)
Subjective:     Patient ID: Tammy Mccoy, female   DOB: September 09, 1950, 66 y.o.   MRN: [id number]  HPI  This 66 year old female had laser ablation left great saphenous vein with multiple stab phlebectomy on 11/16/2016. She has done well but 4 days ago began having some discomfort behind the left knee. She noted since mild swelling over the past 24 hours. She does have osteoarthritis in the left knee. She did stand a lot over the Thanksgiving holidays cooking and thinks this may be the culprit. He is concerned about a blood clot. She's had no chest pain dyspnea on exertion hemoptysis.  Past Medical History:  Diagnosis Date  . Anxiety   . Arthritis    bil knees and neck  . Asthma    excersice  . Claustrophobia   . Claustrophobia   . Depression   . Difficult intubation 06/11/2016   glidescope grade 2  . Family history of adverse reaction to anesthesia    sister has vomiting  . H/O rosacea   . Multiple thyroid nodules   . Skin cancer, basal cell   . Sleep apnea    no cpap  . Squamous cell skin cancer     Social History   Tobacco Use  . Smoking status: Former Smoker    Packs/day: 1.00    Years: 35.00    Pack years: 35.00    Types: Cigarettes    Last attempt to quit: 03/01/2008    Years since quitting: 8.9  . Smokeless tobacco: Never Used  Substance Use Topics  . Alcohol use: No    Comment: past    Family History  Problem Relation Age of Onset  . Cancer Father        Bladder  . CVA Mother 78    Allergies  Allergen Reactions  . Bupropion     Worsened depression   . Ibuprofen Itching    Lower lip swelling      Current Outpatient Medications:  .  acetaminophen (TYLENOL) 650 MG CR tablet, Take 1,300 mg by mouth daily as needed for pain., Disp: , Rfl:  .  ALPRAZolam (XANAX) 0.5 MG tablet, Take 0.25-0.5 mg by mouth at bedtime as needed for sleep. , Disp: , Rfl:  .  Carboxymethylcellulose Sodium (THERATEARS OP), Apply 1 drop to eye daily as needed (dry eyes)., Disp:  , Rfl:  .  chlorpheniramine (CHLOR-TRIMETON) 4 MG tablet, Take 4 mg by mouth daily as needed for allergies., Disp: , Rfl:  .  Cholecalciferol (VITAMIN D3) 2000 units capsule, Take 2,000 Units by mouth daily., Disp: , Rfl:  .  FLUoxetine (PROZAC) 10 MG capsule, Take 30 mg by mouth daily. , Disp: , Rfl:  .  fluticasone (FLONASE) 50 MCG/ACT nasal spray, Place 1 spray into both nostrils daily. , Disp: , Rfl:  .  hydrocortisone 2.5 % cream, Apply 1 application topically 2 (two) times daily as needed (itching). Inside ears, Disp: , Rfl:  .  Loteprednol-Tobramycin (ZYLET OP), Apply 1 drop to eye daily as needed (allergies)., Disp: , Rfl:  .  Multiple Vitamin (MULTIVITAMIN WITH MINERALS) TABS tablet, Take 1 tablet by mouth 2 (two) times a week., Disp: , Rfl:  .  naproxen (NAPROSYN) 500 MG tablet, Take 500 mg by mouth 2 (two) times daily as needed for mild pain. , Disp: , Rfl:  .  naproxen sodium (ANAPROX) 220 MG tablet, Take 220 mg by mouth daily as needed (pain)., Disp: , Rfl:  .  NONFORMULARY OR COMPOUNDED ITEM,  Apply 1-2 g topically 4 (four) times daily., Disp: 120 each, Rfl: 2 .  ranitidine (ZANTAC) 150 MG tablet, Take 150 mg by mouth daily as needed for heartburn., Disp: , Rfl:  .  traMADol (ULTRAM) 50 MG tablet, Take 1 tablet (50 mg total) by mouth 3 (three) times daily., Disp: 90 tablet, Rfl: 2  Current Facility-Administered Medications:  .  betamethasone acetate-betamethasone sodium phosphate (CELESTONE) injection 3 mg, 3 mg, Intramuscular, Once, Edrick Kins, DPM  Vitals:   01/25/17 0946  BP: 140/80  Pulse: 79  Resp: 18  Temp: 98.6 F (37 C)  TempSrc: Oral  SpO2: 95%  Weight: (!) 303 lb 3.2 oz (137.5 kg)  Height: 5\' 5"  (1.651 m)    Body mass index is 50.46 kg/m.         Review of Systems     Objective:   Physical Exam BP 140/80 (BP Location: Left Arm, Patient Position: Sitting, Cuff Size: Large)   Pulse 79   Temp 98.6 F (37 C) (Oral)   Resp 18   Ht 5\' 5"  (1.651 m)    Wt (!) 303 lb 3.2 oz (137.5 kg)   SpO2 95%   BMI 50.46 kg/m   Gen. well-developed obese female no apparent distress and oriented 3 Lungs no rhonchi or wheezing Left leg with well healed multiple stab phlebectomy sites and medial thigh and calf. Trace distal edema noted. 3+ dorsalis pedis pulse palpable. No calf tenderness.  Today I ordered a venous duplex exam the left leg which I reviewed and interpreted. There is no DVT. There is total closure of the left great saphenous vein from the calf to near the saphenofemoral junction.     Assessment:     Successful laser ablation left great saphenous vein with multiple stab phlebectomy. Mild swelling and discomfort behind the left knee-no evidence of DVT    Plan:     Elevate foot of bed 2-3 inches Begin wearing short leg elastic compression stockings Patient reassured and return to see me on a when necessary basis

## 2017-02-02 DIAGNOSIS — M5416 Radiculopathy, lumbar region: Secondary | ICD-10-CM | POA: Diagnosis not present

## 2017-02-02 DIAGNOSIS — M545 Low back pain: Secondary | ICD-10-CM | POA: Diagnosis not present

## 2017-02-02 DIAGNOSIS — R262 Difficulty in walking, not elsewhere classified: Secondary | ICD-10-CM | POA: Diagnosis not present

## 2017-02-02 DIAGNOSIS — M549 Dorsalgia, unspecified: Secondary | ICD-10-CM | POA: Diagnosis not present

## 2017-02-10 DIAGNOSIS — Z1231 Encounter for screening mammogram for malignant neoplasm of breast: Secondary | ICD-10-CM | POA: Diagnosis not present

## 2017-02-10 DIAGNOSIS — Z803 Family history of malignant neoplasm of breast: Secondary | ICD-10-CM | POA: Diagnosis not present

## 2017-02-14 ENCOUNTER — Telehealth: Payer: Self-pay | Admitting: Genetic Counselor

## 2017-02-14 ENCOUNTER — Other Ambulatory Visit: Payer: Self-pay | Admitting: Internal Medicine

## 2017-02-14 DIAGNOSIS — Z8349 Family history of other endocrine, nutritional and metabolic diseases: Secondary | ICD-10-CM | POA: Diagnosis not present

## 2017-02-14 DIAGNOSIS — Z8052 Family history of malignant neoplasm of bladder: Secondary | ICD-10-CM | POA: Diagnosis not present

## 2017-02-14 DIAGNOSIS — Z801 Family history of malignant neoplasm of trachea, bronchus and lung: Secondary | ICD-10-CM | POA: Diagnosis not present

## 2017-02-14 DIAGNOSIS — Z8639 Personal history of other endocrine, nutritional and metabolic disease: Secondary | ICD-10-CM | POA: Diagnosis not present

## 2017-02-14 DIAGNOSIS — E042 Nontoxic multinodular goiter: Secondary | ICD-10-CM | POA: Diagnosis not present

## 2017-02-14 DIAGNOSIS — Z803 Family history of malignant neoplasm of breast: Secondary | ICD-10-CM | POA: Diagnosis not present

## 2017-02-14 DIAGNOSIS — Z808 Family history of malignant neoplasm of other organs or systems: Secondary | ICD-10-CM | POA: Diagnosis not present

## 2017-02-14 NOTE — Telephone Encounter (Signed)
02/14/17 called patient and left message to return call and confirm appointment with Tammy Mccoy on 03/28/17 @ 1:00 pm for Genetic counseling followed by a lab.

## 2017-02-15 DIAGNOSIS — M5416 Radiculopathy, lumbar region: Secondary | ICD-10-CM | POA: Diagnosis not present

## 2017-02-15 DIAGNOSIS — R262 Difficulty in walking, not elsewhere classified: Secondary | ICD-10-CM | POA: Diagnosis not present

## 2017-02-15 DIAGNOSIS — M549 Dorsalgia, unspecified: Secondary | ICD-10-CM | POA: Diagnosis not present

## 2017-02-15 DIAGNOSIS — M545 Low back pain: Secondary | ICD-10-CM | POA: Diagnosis not present

## 2017-03-08 DIAGNOSIS — M5416 Radiculopathy, lumbar region: Secondary | ICD-10-CM | POA: Diagnosis not present

## 2017-03-08 DIAGNOSIS — R262 Difficulty in walking, not elsewhere classified: Secondary | ICD-10-CM | POA: Diagnosis not present

## 2017-03-08 DIAGNOSIS — M549 Dorsalgia, unspecified: Secondary | ICD-10-CM | POA: Diagnosis not present

## 2017-03-08 DIAGNOSIS — M545 Low back pain: Secondary | ICD-10-CM | POA: Diagnosis not present

## 2017-03-10 DIAGNOSIS — R262 Difficulty in walking, not elsewhere classified: Secondary | ICD-10-CM | POA: Diagnosis not present

## 2017-03-10 DIAGNOSIS — M5416 Radiculopathy, lumbar region: Secondary | ICD-10-CM | POA: Diagnosis not present

## 2017-03-10 DIAGNOSIS — M545 Low back pain: Secondary | ICD-10-CM | POA: Diagnosis not present

## 2017-03-10 DIAGNOSIS — M549 Dorsalgia, unspecified: Secondary | ICD-10-CM | POA: Diagnosis not present

## 2017-03-15 DIAGNOSIS — M5416 Radiculopathy, lumbar region: Secondary | ICD-10-CM | POA: Diagnosis not present

## 2017-03-15 DIAGNOSIS — M545 Low back pain: Secondary | ICD-10-CM | POA: Diagnosis not present

## 2017-03-15 DIAGNOSIS — R262 Difficulty in walking, not elsewhere classified: Secondary | ICD-10-CM | POA: Diagnosis not present

## 2017-03-15 DIAGNOSIS — M549 Dorsalgia, unspecified: Secondary | ICD-10-CM | POA: Diagnosis not present

## 2017-03-23 ENCOUNTER — Ambulatory Visit
Admission: RE | Admit: 2017-03-23 | Discharge: 2017-03-23 | Disposition: A | Discharge status: Home / Self Care | Payer: PPO | Source: Ambulatory Visit | Attending: Internal Medicine | Admitting: Internal Medicine

## 2017-03-23 DIAGNOSIS — I868 Varicose veins of other specified sites: Secondary | ICD-10-CM

## 2017-03-23 DIAGNOSIS — E042 Nontoxic multinodular goiter: Secondary | ICD-10-CM

## 2017-03-28 ENCOUNTER — Encounter: Payer: PPO | Admitting: Genetic Counselor

## 2017-03-28 ENCOUNTER — Other Ambulatory Visit: Payer: PPO

## 2017-03-29 DIAGNOSIS — M549 Dorsalgia, unspecified: Secondary | ICD-10-CM | POA: Diagnosis not present

## 2017-03-29 DIAGNOSIS — M545 Low back pain: Secondary | ICD-10-CM | POA: Diagnosis not present

## 2017-03-29 DIAGNOSIS — M5416 Radiculopathy, lumbar region: Secondary | ICD-10-CM | POA: Diagnosis not present

## 2017-03-29 DIAGNOSIS — R262 Difficulty in walking, not elsewhere classified: Secondary | ICD-10-CM | POA: Diagnosis not present

## 2017-04-05 DIAGNOSIS — M545 Low back pain: Secondary | ICD-10-CM | POA: Diagnosis not present

## 2017-04-05 DIAGNOSIS — R262 Difficulty in walking, not elsewhere classified: Secondary | ICD-10-CM | POA: Diagnosis not present

## 2017-04-05 DIAGNOSIS — M549 Dorsalgia, unspecified: Secondary | ICD-10-CM | POA: Diagnosis not present

## 2017-04-05 DIAGNOSIS — M5416 Radiculopathy, lumbar region: Secondary | ICD-10-CM | POA: Diagnosis not present

## 2017-04-07 DIAGNOSIS — R262 Difficulty in walking, not elsewhere classified: Secondary | ICD-10-CM | POA: Diagnosis not present

## 2017-04-07 DIAGNOSIS — M549 Dorsalgia, unspecified: Secondary | ICD-10-CM | POA: Diagnosis not present

## 2017-04-07 DIAGNOSIS — M545 Low back pain: Secondary | ICD-10-CM | POA: Diagnosis not present

## 2017-04-07 DIAGNOSIS — M5416 Radiculopathy, lumbar region: Secondary | ICD-10-CM | POA: Diagnosis not present

## 2017-04-11 DIAGNOSIS — R262 Difficulty in walking, not elsewhere classified: Secondary | ICD-10-CM | POA: Diagnosis not present

## 2017-04-11 DIAGNOSIS — M5416 Radiculopathy, lumbar region: Secondary | ICD-10-CM | POA: Diagnosis not present

## 2017-04-11 DIAGNOSIS — M545 Low back pain: Secondary | ICD-10-CM | POA: Diagnosis not present

## 2017-04-11 DIAGNOSIS — M549 Dorsalgia, unspecified: Secondary | ICD-10-CM | POA: Diagnosis not present

## 2017-04-14 DIAGNOSIS — M549 Dorsalgia, unspecified: Secondary | ICD-10-CM | POA: Diagnosis not present

## 2017-04-14 DIAGNOSIS — R262 Difficulty in walking, not elsewhere classified: Secondary | ICD-10-CM | POA: Diagnosis not present

## 2017-04-14 DIAGNOSIS — M545 Low back pain: Secondary | ICD-10-CM | POA: Diagnosis not present

## 2017-04-14 DIAGNOSIS — M5416 Radiculopathy, lumbar region: Secondary | ICD-10-CM | POA: Diagnosis not present

## 2017-04-19 DIAGNOSIS — M549 Dorsalgia, unspecified: Secondary | ICD-10-CM | POA: Diagnosis not present

## 2017-04-19 DIAGNOSIS — R262 Difficulty in walking, not elsewhere classified: Secondary | ICD-10-CM | POA: Diagnosis not present

## 2017-04-19 DIAGNOSIS — M5416 Radiculopathy, lumbar region: Secondary | ICD-10-CM | POA: Diagnosis not present

## 2017-04-19 DIAGNOSIS — M545 Low back pain: Secondary | ICD-10-CM | POA: Diagnosis not present

## 2017-04-22 DIAGNOSIS — M5416 Radiculopathy, lumbar region: Secondary | ICD-10-CM | POA: Diagnosis not present

## 2017-04-22 DIAGNOSIS — R262 Difficulty in walking, not elsewhere classified: Secondary | ICD-10-CM | POA: Diagnosis not present

## 2017-04-22 DIAGNOSIS — M549 Dorsalgia, unspecified: Secondary | ICD-10-CM | POA: Diagnosis not present

## 2017-04-22 DIAGNOSIS — M545 Low back pain: Secondary | ICD-10-CM | POA: Diagnosis not present

## 2017-04-27 DIAGNOSIS — J101 Influenza due to other identified influenza virus with other respiratory manifestations: Secondary | ICD-10-CM | POA: Diagnosis not present

## 2017-05-05 DIAGNOSIS — N3281 Overactive bladder: Secondary | ICD-10-CM | POA: Diagnosis not present

## 2017-05-05 DIAGNOSIS — F17201 Nicotine dependence, unspecified, in remission: Secondary | ICD-10-CM | POA: Diagnosis not present

## 2017-05-05 DIAGNOSIS — Z0000 Encounter for general adult medical examination without abnormal findings: Secondary | ICD-10-CM | POA: Diagnosis not present

## 2017-05-05 DIAGNOSIS — Z23 Encounter for immunization: Secondary | ICD-10-CM | POA: Diagnosis not present

## 2017-05-05 DIAGNOSIS — F331 Major depressive disorder, recurrent, moderate: Secondary | ICD-10-CM | POA: Diagnosis not present

## 2017-05-05 DIAGNOSIS — F1010 Alcohol abuse, uncomplicated: Secondary | ICD-10-CM | POA: Diagnosis not present

## 2017-05-05 DIAGNOSIS — E785 Hyperlipidemia, unspecified: Secondary | ICD-10-CM | POA: Diagnosis not present

## 2017-05-10 ENCOUNTER — Telehealth: Payer: Self-pay | Admitting: Acute Care

## 2017-05-10 DIAGNOSIS — Z87891 Personal history of nicotine dependence: Secondary | ICD-10-CM

## 2017-05-10 DIAGNOSIS — Z122 Encounter for screening for malignant neoplasm of respiratory organs: Secondary | ICD-10-CM

## 2017-05-11 ENCOUNTER — Inpatient Hospital Stay: Payer: PPO

## 2017-05-11 ENCOUNTER — Encounter: Payer: Self-pay | Admitting: Genetic Counselor

## 2017-05-11 ENCOUNTER — Inpatient Hospital Stay: Payer: PPO | Attending: Genetic Counselor | Admitting: Genetic Counselor

## 2017-05-11 DIAGNOSIS — E210 Primary hyperparathyroidism: Secondary | ICD-10-CM | POA: Diagnosis not present

## 2017-05-11 DIAGNOSIS — Z808 Family history of malignant neoplasm of other organs or systems: Secondary | ICD-10-CM | POA: Insufficient documentation

## 2017-05-11 DIAGNOSIS — K635 Polyp of colon: Secondary | ICD-10-CM

## 2017-05-11 DIAGNOSIS — Z800 Family history of malignant neoplasm of digestive organs: Secondary | ICD-10-CM

## 2017-05-11 DIAGNOSIS — Z803 Family history of malignant neoplasm of breast: Secondary | ICD-10-CM

## 2017-05-11 DIAGNOSIS — Z8052 Family history of malignant neoplasm of bladder: Secondary | ICD-10-CM | POA: Diagnosis not present

## 2017-05-11 DIAGNOSIS — E213 Hyperparathyroidism, unspecified: Secondary | ICD-10-CM | POA: Insufficient documentation

## 2017-05-11 NOTE — Progress Notes (Signed)
REFERRING PROVIDER: Harlan Stains, MD [address] [place], [place] [address]  PRIMARY PROVIDER:  Harlan Stains, MD  PRIMARY REASON FOR VISIT:  1. Hyperparathyroidism, primary ([place])   2. Family history of breast cancer   3. Family history of thyroid cancer   4. Family history of colon cancer   5. Family history of bladder cancer   6. Polyp of colon, unspecified part of colon, unspecified type      HISTORY OF PRESENT ILLNESS:   Tammy Mccoy, a 67 y.o. female, was seen for a [place] cancer genetics consultation at the request of Dr. Dema Severin due to a personal history of hyperparathyroidism and thyroid nodules and a family history of cancer.  Tammy Mccoy presents to clinic today to discuss the possibility of a hereditary predisposition to cancer, genetic testing, and to further clarify her future cancer risks, as well as potential cancer risks for family members.   Tammy Mccoy is a 67 y.o. female with no personal history of cancer.  She was diagnosed with hyperparathyroidism last year, and has benign thyroid nodules.  She also has a history of multiple colon polyps.  She sees Dr. Collene Mares through [facility], and thinks that she has had 10+ colon polyps.  She gets colonoscopies every 5 years.  CANCER HISTORY:   No history exists.     HORMONAL RISK FACTORS:  Menarche was at age 54-13.  First live birth at age 47.  OCP use for approximately 2-3 years.  Ovaries intact: yes.  Hysterectomy: no.  Menopausal status: postmenopausal.  HRT use: 0 years. Colonoscopy: yes; 10+ colon polyps. Mammogram within the last year: yes. Number of breast biopsies: 0. Up to date with pelvic exams:  yes. Any excessive radiation exposure in the past:  no  Past Medical History:  Diagnosis Date  . Anxiety   . Arthritis    bil knees and neck  . Asthma    excersice  . Claustrophobia   . Claustrophobia   . Colon polyps   . Depression   . Difficult intubation 06/11/2016   glidescope grade 2  . Family history of adverse reaction to anesthesia    sister has vomiting  . Family history of bladder cancer   . Family history of breast cancer   . Family history of colon cancer   . Family history of thyroid cancer   . H/O rosacea   . Hyperparathyroidism ([place])   . Multiple thyroid nodules   . Skin cancer, basal cell   . Sleep apnea    no cpap  . Squamous cell skin cancer     Past Surgical History:  Procedure Laterality Date  . CARPAL TUNNEL RELEASE     Bilateral  . CESAREAN SECTION     x 2  . ENDOVENOUS ABLATION SAPHENOUS VEIN W/ LASER Left 11/16/2016   endovenous laser ablation L GSV and stab phlebectomy > 20 incisions left leg by Tinnie Gens MD   . PARATHYROIDECTOMY Left 06/11/2016   Procedure: LEFT PARATHYROIDECTOMY;  Surgeon: Armandina Gemma, MD;  Location: [facility];  Service: General;  Laterality: Left;  . TONSILLECTOMY AND ADENOIDECTOMY    . TUBAL LIGATION      Social History   Socioeconomic History  . Marital status: Married    Spouse name: Not on file  . Number of children: 2  . Years of education: Not on file  . Highest education level: Not on file  Social Needs  . Financial resource strain: Not on file  .  Food insecurity - worry: Not on file  . Food insecurity - inability: Not on file  . Transportation needs - medical: Not on file  . Transportation needs - non-medical: Not on file  Occupational History  . Occupation: retired  Tobacco Use  . Smoking status: Former Smoker    Packs/day: 1.00    Years: 35.00    Pack years: 35.00    Types: Cigarettes    Last attempt to quit: 03/01/2008    Years since quitting: 9.2  . Smokeless tobacco: Never Used  Substance and Sexual Activity  . Alcohol use: No    Comment: past  . Drug use: No  . Sexual activity: Not Currently  Other Topics Concern  . Not on file  Social History Narrative   Used to drink alcohol.  None in the last 10 years.   Son lives with her.    She has two grands.  She helps take  care of them.      FAMILY HISTORY:  We obtained a detailed, 4-generation family history.  Significant diagnoses are listed below: Family History  Problem Relation Age of Onset  . Cancer Father        Bladder  . CVA Mother 76  . Thyroid cancer Mother        dx in her 64s  . Colon cancer Mother        dx in her 64s, d. 71  . Lung cancer Mother        dx in 33s  . Hyperparathyroidism Sister   . Diabetes Maternal Grandmother   . COPD Maternal Grandfather   . Breast cancer Paternal Grandmother        dx in 57s; d. 19  . Thyroid cancer Sister        dx in 34s, 2nd time at 45  . Breast cancer Sister 67       second at 39    The patient has two children who are cancer free.  She has two older sisters.  One has never had cancer but has a diagnosis of hyperparathyroidism.  The other sister was diagnosed with thyroid cancer in her 54's and again at 97, and breast cancer at age 67 and again at 33. This sister has a daughter who was diagnosed with thyroid cancer in her 24's.  Both parents are deceased.  The patient's mother was diagnosed with thyroid cancer in her 35's.  She was then diagnosed with lung and colon cancer in her 59's and lastly parotid gland cancer in her late 9's-80's.  She had three full sisters and two paternal half brothers and two paternal half sisters.  None had cancer.  Both maternal grandparents are deceased.  The patient's father died from bladder cancer at age 62.  He had two brothers, one who died as a toddler from an illness, and one as an adult from non cancer related issues.  The paternal grandparents are deceased.  The grandmother developed breast cancer in her 71's and died at 17 and the grandfather died from old age.  Tammy Mccoy is unaware of previous family history of genetic testing for hereditary cancer risks. Patient's ancestors are of [place] and [place] descent. There is no reported Ashkenazi Jewish ancestry. There is no known consanguinity.  GENETIC  COUNSELING ASSESSMENT: Tammy Mccoy is a 67 y.o. female with a personal history of hyperparathyroidism and a family history of thyroid cancer, hyperparathyroidism and breast cancer which is somewhat suggestive of a hereditary cancer syndrome such  as MEN1 or 2 and predisposition to cancer. We, therefore, discussed and recommended the following at today's visit.   DISCUSSION: We discussed that most thyroid cancer is sporadic, but there are two histologies that tend to be more heritable - follicular thyroid cancer and medullary thyroid cancer. Follicular thyroid cancer can be associated with PTEN, and has an increased risk for thyroid cancer, uterine, colon and breast cancer.  Another type of thyroid cancer, Medullary thyroid cancer, is associated with MEN2.  The patient also has a personal history of 10+ colon polyps and family history of colon cancer.  This can be associated with a hereditary colon cancer syndrome as well.    We reviewed the characteristics, features and inheritance patterns of hereditary cancer syndromes. We also discussed genetic testing, including the appropriate family members to test, the process of testing, insurance coverage and turn-around-time for results. We discussed the implications of a negative, positive and/or variant of uncertain significant result. We recommended Tammy Mccoy pursue genetic testing for the multi cancer gene panel. The Multi-Gene Panel offered by [facility] includes sequencing and/or deletion duplication testing of the following 83 genes: ALK, APC, ATM, AXIN2,BAP1,  BARD1, BLM, BMPR1A, BRCA1, BRCA2, BRIP1, CASR, CDC73, CDH1, CDK4, CDKN1B, CDKN1C, CDKN2A (p14ARF), CDKN2A (p16INK4a), CEBPA, CHEK2, CTNNA1, DICER1, DIS3L2, EGFR (c.2369C>T, p.Thr790Met variant only), EPCAM (Deletion/duplication testing only), FH, FLCN, GATA2, GPC3, GREM1 (Promoter region deletion/duplication testing only), HOXB13 (c.251G>A, p.Gly84Glu), HRAS, KIT, MAX, MEN1, MET, MITF (c.952G>A,  p.Glu318Lys variant only), MLH1, MSH2, MSH3, MSH6, MUTYH, NBN, NF1, NF2, NTHL1, PALB2, PDGFRA, PHOX2B, PMS2, POLD1, POLE, POT1, PRKAR1A, PTCH1, PTEN, RAD50, RAD51C, RAD51D, RB1, RECQL4, RET, RUNX1, SDHAF2, SDHA (sequence changes only), SDHB, SDHC, SDHD, SMAD4, SMARCA4, SMARCB1, SMARCE1, STK11, SUFU, TERT, TERT, TMEM127, TP53, TSC1, TSC2, VHL, WRN and WT1.    Based on Tammy Mccoy's personal history of endocrine disease and family history of cancer, she meets medical criteria for genetic testing. Despite that she meets criteria, she may still have an out of pocket cost. We discussed that if her out of pocket cost for testing is over $100, the laboratory will call and confirm whether she wants to proceed with testing.  If the out of pocket cost of testing is less than $100 she will be billed by the genetic testing laboratory.   PLAN: After considering the risks, benefits, and limitations, Tammy Mccoy  provided informed consent to pursue genetic testing and the blood sample was sent to [facility] for analysis of the Multi-cancer panel. Results should be available within approximately 2-3 weeks' time, at which point they will be disclosed by telephone to Tammy Mccoy, as will any additional recommendations warranted by these results. Tammy Mccoy will receive a summary of her genetic counseling visit and a copy of her results once available. This information will also be available in Epic. We encouraged Tammy Mccoy to remain in contact with cancer genetics annually so that we can continuously update the family history and inform her of any changes in cancer genetics and testing that may be of benefit for her family. Tammy Mccoy questions were answered to her satisfaction today. Our contact information was provided should additional questions or concerns arise.  Lastly, we encouraged Tammy Mccoy to remain in contact with cancer genetics annually so that we can continuously update the family history  and inform her of any changes in cancer genetics and testing that may be of benefit for this family.   Ms.  Mccoy questions were answered to her satisfaction today. Our contact information  was provided should additional questions or concerns arise. Thank you for the referral and allowing [place] to share in the care of your patient.   Cary Wilford P. Florene Glen, [place], [facility] Certified Genetic Counselor [email or web address] phone: [phone]  The patient was seen for a total of 50 minutes in face-to-face genetic counseling.  This patient was discussed with Drs. Magrinat, Lindi Adie and/or Burr Medico who agrees with the above.    _______________________________________________________________________ For Office Staff:  Number of people involved in session: 1 Was an Intern/ student involved with case: no

## 2017-05-13 DIAGNOSIS — R262 Difficulty in walking, not elsewhere classified: Secondary | ICD-10-CM | POA: Diagnosis not present

## 2017-05-13 DIAGNOSIS — M5416 Radiculopathy, lumbar region: Secondary | ICD-10-CM | POA: Diagnosis not present

## 2017-05-13 DIAGNOSIS — M549 Dorsalgia, unspecified: Secondary | ICD-10-CM | POA: Diagnosis not present

## 2017-05-13 DIAGNOSIS — M545 Low back pain: Secondary | ICD-10-CM | POA: Diagnosis not present

## 2017-05-13 NOTE — Telephone Encounter (Signed)
Patient returning call - she can be reached at [phone]

## 2017-05-13 NOTE — Telephone Encounter (Signed)
LMTC x 1  

## 2017-05-13 NOTE — Telephone Encounter (Signed)
Spoke with pt and scheduled SDMV 06/29/17 9:00 CT ordered Nothing further needed

## 2017-05-20 DIAGNOSIS — M5416 Radiculopathy, lumbar region: Secondary | ICD-10-CM | POA: Diagnosis not present

## 2017-05-20 DIAGNOSIS — M549 Dorsalgia, unspecified: Secondary | ICD-10-CM | POA: Diagnosis not present

## 2017-05-20 DIAGNOSIS — M545 Low back pain: Secondary | ICD-10-CM | POA: Diagnosis not present

## 2017-05-20 DIAGNOSIS — R262 Difficulty in walking, not elsewhere classified: Secondary | ICD-10-CM | POA: Diagnosis not present

## 2017-05-24 DIAGNOSIS — M549 Dorsalgia, unspecified: Secondary | ICD-10-CM | POA: Diagnosis not present

## 2017-05-24 DIAGNOSIS — M545 Low back pain: Secondary | ICD-10-CM | POA: Diagnosis not present

## 2017-05-24 DIAGNOSIS — M5416 Radiculopathy, lumbar region: Secondary | ICD-10-CM | POA: Diagnosis not present

## 2017-05-24 DIAGNOSIS — R262 Difficulty in walking, not elsewhere classified: Secondary | ICD-10-CM | POA: Diagnosis not present

## 2017-05-26 ENCOUNTER — Telehealth: Payer: Self-pay | Admitting: Genetic Counselor

## 2017-05-26 ENCOUNTER — Ambulatory Visit: Payer: Self-pay | Admitting: Genetic Counselor

## 2017-05-26 ENCOUNTER — Encounter: Payer: Self-pay | Admitting: Genetic Counselor

## 2017-05-26 DIAGNOSIS — Z803 Family history of malignant neoplasm of breast: Secondary | ICD-10-CM

## 2017-05-26 DIAGNOSIS — M549 Dorsalgia, unspecified: Secondary | ICD-10-CM | POA: Diagnosis not present

## 2017-05-26 DIAGNOSIS — Z8052 Family history of malignant neoplasm of bladder: Secondary | ICD-10-CM

## 2017-05-26 DIAGNOSIS — R262 Difficulty in walking, not elsewhere classified: Secondary | ICD-10-CM | POA: Diagnosis not present

## 2017-05-26 DIAGNOSIS — M545 Low back pain: Secondary | ICD-10-CM | POA: Diagnosis not present

## 2017-05-26 DIAGNOSIS — Z808 Family history of malignant neoplasm of other organs or systems: Secondary | ICD-10-CM

## 2017-05-26 DIAGNOSIS — Z800 Family history of malignant neoplasm of digestive organs: Secondary | ICD-10-CM

## 2017-05-26 DIAGNOSIS — Z1379 Encounter for other screening for genetic and chromosomal anomalies: Secondary | ICD-10-CM | POA: Insufficient documentation

## 2017-05-26 DIAGNOSIS — M5416 Radiculopathy, lumbar region: Secondary | ICD-10-CM | POA: Diagnosis not present

## 2017-05-26 DIAGNOSIS — K635 Polyp of colon: Secondary | ICD-10-CM

## 2017-05-26 NOTE — Telephone Encounter (Signed)
Revealed negative genetic testing.  Discussed that we do not know why she has a goiter and hyperparathyroidism or why there is cancer in the family. It could be due to a different gene that we are not testing, or maybe our current technology may not be able to pick something up.  It will be important for her to keep in contact with genetics to keep up with whether additional testing may be needed.   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  This is still a normal result and we will not change medical management based on this result.

## 2017-05-26 NOTE — Telephone Encounter (Signed)
LM on VM with good news.  Asked that she CB. 

## 2017-05-26 NOTE — Progress Notes (Addendum)
 HPI:  Ms. Sossamon was previously seen in the [facility] due to a personal history of endocrine problems and family history of cancer and concerns regarding a hereditary predisposition to cancer. Please refer to our prior cancer genetics clinic note for more information regarding Ms. Sealey's medical, social and family histories, and our assessment and recommendations, at the time. Ms. Deanes recent genetic test results were disclosed to her, as were recommendations warranted by these results. These results and recommendations are discussed in more detail below.  CANCER HISTORY:   No history exists.    FAMILY HISTORY:  We obtained a detailed, 4-generation family history.  Significant diagnoses are listed below: Family History  Problem Relation Age of Onset   Cancer Father        Bladder   CVA Mother 94   Thyroid  cancer Mother        dx in her 61s   Colon cancer Mother        dx in her 52s, d. 86   Lung cancer Mother        dx in 60s   Cancer Mother        parotid gland cancer, d. 28   Hyperparathyroidism Sister    Diabetes Maternal Grandmother    COPD Maternal Grandfather    Breast cancer Paternal Grandmother        dx in 57s; d. 4   Thyroid  cancer Sister        dx in 24s, 2nd time at 54   Breast cancer Sister 59       second at 4   Thyroid  cancer Other        dx late 30s; daughter of sister with thyroid  cancer    The patient has two children who are cancer free.  She has two older sisters.  One has never had cancer but has a diagnosis of hyperparathyroidism.  The other sister was diagnosed with thyroid  cancer in her 52's and again at 63, and breast cancer at age 4 and again at 75. This sister has a daughter who was diagnosed with thyroid  cancer in her 39's.  Both parents are deceased.   The patient's mother was diagnosed with thyroid  cancer in her 54's.  She was then diagnosed with lung and colon cancer in her 57's and lastly parotid gland cancer  in her late 62's-80's.  She had three full sisters and two paternal half brothers and two paternal half sisters.  None had cancer.  Both maternal grandparents are deceased.   The patient's father died from bladder cancer at age 75.  He had two brothers, one who died as a toddler from an illness, and one as an adult from non cancer related issues.  The paternal grandparents are deceased.  The grandmother developed breast cancer in her 35's and died at 70 and the grandfather died from old age.   Ms. Huschka is unaware of previous family history of genetic testing for hereditary cancer risks. Patient's ancestors are of [place] and [place] descent. There is no reported Ashkenazi Jewish ancestry. There is no known consanguinity.  GENETIC TEST RESULTS: Genetic testing reported out on May 24, 2017 through the Multi-cancer panel found no deleterious mutations.  The Multi-Gene Panel offered by [facility] includes sequencing and/or deletion duplication testing of the following 83 genes: ALK, APC, ATM, AXIN2,BAP1,  BARD1, BLM, BMPR1A, BRCA1, BRCA2, BRIP1, CASR, CDC73, CDH1, CDK4, CDKN1B, CDKN1C, CDKN2A (p14ARF), CDKN2A (p16INK4a), CEBPA, CHEK2, CTNNA1, DICER1, DIS3L2, EGFR (c.2369C>T, p.Thr790Met  variant only), EPCAM (Deletion/duplication testing only), FH, FLCN, GATA2, GPC3, GREM1 (Promoter region deletion/duplication testing only), HOXB13 (c.251G>A, p.Gly84Glu), HRAS, KIT, MAX, MEN1, MET, MITF (c.952G>A, p.Glu318Lys variant only), MLH1, MSH2, MSH3, MSH6, MUTYH, NBN, NF1, NF2, NTHL1, PALB2, PDGFRA, PHOX2B, PMS2, POLD1, POLE, POT1, PRKAR1A, PTCH1, PTEN, RAD50, RAD51C, RAD51D, RB1, RECQL4, RET, RUNX1, SDHAF2, SDHA (sequence changes only), SDHB, SDHC, SDHD, SMAD4, SMARCA4, SMARCB1, SMARCE1, STK11, SUFU, TERT, TERT, TMEM127, TP53, TSC1, TSC2, VHL, WRN and WT1.  The test report has been scanned into EPIC and is located under the Molecular Pathology section of the Results Review tab.    We discussed with Ms. Boyd that  since the current genetic testing is not perfect, it is possible there may be a gene mutation in one of these genes that current testing cannot detect, but that chance is small.  We also discussed, that it is possible that another gene that has not yet been discovered, or that we have not yet tested, is responsible for the cancer diagnoses in the family, and it is, therefore, important to remain in touch with cancer genetics in the future so that we can continue to offer Ms. Bol the most up to date genetic testing.   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 At this time, it is unknown if this variant is associated with increased cancer risk or if this is a normal finding, but most variants such as this get reclassified to being inconsequential. It should not be used to make medical management decisions. With time, we suspect the lab will determine the significance of this variant, if any. If we do learn more about it, we will try to contact Ms. Scheuring to discuss it further. However, it is important to stay in touch with us  periodically and keep the address and phone number up to date. 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   CANCER SCREENING RECOMMENDATIONS:  This normal result is reassuring and indicates that Ms. Herrada does not likely have an increased risk of cancer due to a mutation in one of these genes.  We, therefore, recommended  Ms. Dijulio continue to follow the cancer screening guidelines provided by her primary healthcare providers.   An individual's cancer risk and medical management are not determined by genetic test results alone. Overall cancer risk assessment incorporates additional factors, including personal medical history, family history, and any available genetic information that may result in a personalized plan for cancer prevention and surveillance.  RECOMMENDATIONS FOR FAMILY MEMBERS:   Women in this family might be at some increased risk of developing cancer, over the general population risk, simply due to the family history of cancer.  We recommended women in this family have a yearly mammogram beginning at age 20, or 80 years younger than the earliest onset of cancer, an annual clinical breast exam, and perform monthly breast self-exams. Women in this family should also have a gynecological exam as recommended by their primary provider. All family members should have a colonoscopy by age 76.  Based on Ms. Knock's family history, we recommended her sister, who was diagnosed with breast cancer at age 5, have genetic counseling and testing. Ms. Mulnix will let us  know if we can be of any assistance in coordinating genetic counseling and/or testing for this family member.   FOLLOW-UP: Lastly, we discussed with Ms. Brent that cancer genetics is a rapidly advancing field and it is possible that new genetic tests will  be appropriate for her and/or her family members in the future. We encouraged her to remain in contact with cancer genetics on an annual basis so we can update her personal and family histories and let her know of advances in cancer genetics that may benefit this family.   Our contact number was provided. Ms. Santoso questions were answered to her satisfaction, and she knows she is welcome to call us  at anytime with additional questions or concerns.   Marijo Shove, MS, [facility] Certified Genetic Counselor [email or web address]

## 2017-05-31 DIAGNOSIS — M5416 Radiculopathy, lumbar region: Secondary | ICD-10-CM | POA: Diagnosis not present

## 2017-05-31 DIAGNOSIS — M549 Dorsalgia, unspecified: Secondary | ICD-10-CM | POA: Diagnosis not present

## 2017-05-31 DIAGNOSIS — R262 Difficulty in walking, not elsewhere classified: Secondary | ICD-10-CM | POA: Diagnosis not present

## 2017-05-31 DIAGNOSIS — M545 Low back pain: Secondary | ICD-10-CM | POA: Diagnosis not present

## 2017-06-02 DIAGNOSIS — M545 Low back pain: Secondary | ICD-10-CM | POA: Diagnosis not present

## 2017-06-02 DIAGNOSIS — M5416 Radiculopathy, lumbar region: Secondary | ICD-10-CM | POA: Diagnosis not present

## 2017-06-02 DIAGNOSIS — M549 Dorsalgia, unspecified: Secondary | ICD-10-CM | POA: Diagnosis not present

## 2017-06-02 DIAGNOSIS — R262 Difficulty in walking, not elsewhere classified: Secondary | ICD-10-CM | POA: Diagnosis not present

## 2017-06-09 DIAGNOSIS — M5416 Radiculopathy, lumbar region: Secondary | ICD-10-CM | POA: Diagnosis not present

## 2017-06-09 DIAGNOSIS — M549 Dorsalgia, unspecified: Secondary | ICD-10-CM | POA: Diagnosis not present

## 2017-06-09 DIAGNOSIS — R262 Difficulty in walking, not elsewhere classified: Secondary | ICD-10-CM | POA: Diagnosis not present

## 2017-06-09 DIAGNOSIS — M545 Low back pain: Secondary | ICD-10-CM | POA: Diagnosis not present

## 2017-06-22 DIAGNOSIS — R262 Difficulty in walking, not elsewhere classified: Secondary | ICD-10-CM | POA: Diagnosis not present

## 2017-06-22 DIAGNOSIS — M549 Dorsalgia, unspecified: Secondary | ICD-10-CM | POA: Diagnosis not present

## 2017-06-22 DIAGNOSIS — M545 Low back pain: Secondary | ICD-10-CM | POA: Diagnosis not present

## 2017-06-22 DIAGNOSIS — M5416 Radiculopathy, lumbar region: Secondary | ICD-10-CM | POA: Diagnosis not present

## 2017-06-24 ENCOUNTER — Telehealth: Payer: Self-pay | Admitting: Acute Care

## 2017-06-24 NOTE — Telephone Encounter (Signed)
Called and spoke with patient, she states that she had spoken to SG once before about the CT scan. She stated that she had the genetic testing done and wanted to give her the results.   The results showed that the patient didn't have any DNA that showed any cancer.   Patient states that she didn't think it was a good idea to have the CT scan done since there is no evidence of cancer.    SG please advise on this. Thank you.

## 2017-06-24 NOTE — Telephone Encounter (Signed)
Please call patient and let her know that the genetic testing she had done indicated she does not likely have  increased risk of cancer due to a gene mutation. This does not mean she should not continue to do normal screening for cancer. The recommendation by Roma Kayser, the genetic counselor she saw, is  noted below.    CANCER SCREENING RECOMMENDATIONS:  This normal result is reassuring and indicates that Tammy Mccoy does not likely have an increased risk of cancer due to a mutation in one of these genes.  We, therefore, recommended  Tammy Mccoy continue to follow the cancer screening guidelines provided by her primary healthcare providers.   An individual's cancer risk and medical management are not determined by genetic test results alone. Overall cancer risk assessment incorporates additional factors, including personal medical history, family history, and any available genetic information that may result in a personalized plan for cancer prevention and surveillance.  Please let the patient know that as a former smoker, which is considered a non-genetic factor that can cause cancer,  she has an  increased risk for lung cancer compared to a non-smoker. She should still be screened, but it is her decision. Recommendation is for Low Dose Lung Cancer Screening Annually until she is 25 or until she has quit for greater than 15 years. She can choose not to follow recommendation, but that is not my  Advice, nor the advice of the genetic counselor. She should be screened.  Please let her know, and have Tammy Mccoy re-schedule her if she does choose to follow recommendations. Thanks so much.

## 2017-06-27 NOTE — Telephone Encounter (Signed)
Called patient, unable to reach left message to give us a call back. 

## 2017-06-28 DIAGNOSIS — R262 Difficulty in walking, not elsewhere classified: Secondary | ICD-10-CM | POA: Diagnosis not present

## 2017-06-28 DIAGNOSIS — M549 Dorsalgia, unspecified: Secondary | ICD-10-CM | POA: Diagnosis not present

## 2017-06-28 DIAGNOSIS — M5416 Radiculopathy, lumbar region: Secondary | ICD-10-CM | POA: Diagnosis not present

## 2017-06-28 DIAGNOSIS — M545 Low back pain: Secondary | ICD-10-CM | POA: Diagnosis not present

## 2017-06-28 NOTE — Telephone Encounter (Signed)
Spoke with pt and advised of recommendations per Eric Form, NP. Pt verbalized understanding.  Pt rescheduled for [facility] 07/20/17 12:00. CT will be rescheduled.  Nothing further needed at this time.

## 2017-06-29 ENCOUNTER — Inpatient Hospital Stay: Admission: RE | Admit: 2017-06-29 | Payer: PPO | Source: Ambulatory Visit

## 2017-06-29 ENCOUNTER — Encounter: Payer: PPO | Admitting: Acute Care

## 2017-07-05 DIAGNOSIS — M549 Dorsalgia, unspecified: Secondary | ICD-10-CM | POA: Diagnosis not present

## 2017-07-05 DIAGNOSIS — R262 Difficulty in walking, not elsewhere classified: Secondary | ICD-10-CM | POA: Diagnosis not present

## 2017-07-05 DIAGNOSIS — M545 Low back pain: Secondary | ICD-10-CM | POA: Diagnosis not present

## 2017-07-05 DIAGNOSIS — M5416 Radiculopathy, lumbar region: Secondary | ICD-10-CM | POA: Diagnosis not present

## 2017-07-12 DIAGNOSIS — M5416 Radiculopathy, lumbar region: Secondary | ICD-10-CM | POA: Diagnosis not present

## 2017-07-12 DIAGNOSIS — M545 Low back pain: Secondary | ICD-10-CM | POA: Diagnosis not present

## 2017-07-12 DIAGNOSIS — R262 Difficulty in walking, not elsewhere classified: Secondary | ICD-10-CM | POA: Diagnosis not present

## 2017-07-12 DIAGNOSIS — M549 Dorsalgia, unspecified: Secondary | ICD-10-CM | POA: Diagnosis not present

## 2017-07-20 ENCOUNTER — Encounter: Payer: Self-pay | Admitting: Acute Care

## 2017-07-20 ENCOUNTER — Ambulatory Visit (INDEPENDENT_AMBULATORY_CARE_PROVIDER_SITE_OTHER)
Admission: RE | Admit: 2017-07-20 | Discharge: 2017-07-20 | Disposition: A | Discharge status: Home / Self Care | Payer: PPO | Source: Ambulatory Visit | Attending: Acute Care | Admitting: Acute Care

## 2017-07-20 ENCOUNTER — Ambulatory Visit (INDEPENDENT_AMBULATORY_CARE_PROVIDER_SITE_OTHER): Payer: PPO | Admitting: Acute Care

## 2017-07-20 DIAGNOSIS — Z87891 Personal history of nicotine dependence: Secondary | ICD-10-CM

## 2017-07-20 DIAGNOSIS — Z122 Encounter for screening for malignant neoplasm of respiratory organs: Secondary | ICD-10-CM

## 2017-07-20 NOTE — Progress Notes (Signed)
Shared Decision Making Visit Lung Cancer Screening Program [phone])   Eligibility:  Age 67 y.o.  Pack Years Smoking History Calculation 38 pack year smoking history (# packs/per year x # years smoked)  Recent History of coughing up blood  no  Unexplained weight loss? no ( >Than 15 pounds within the last 6 months )  Prior History Lung / other cancer no (Diagnosis within the last 5 years already requiring surveillance chest CT Scans).  Smoking Status Former Smoker  Former Smokers: Years since quit: 9 years  Quit Date: 03/01/2008  Visit Components:  Discussion included one or more decision making aids. yes  Discussion included risk/benefits of screening. yes  Discussion included potential follow up diagnostic testing for abnormal scans. yes  Discussion included meaning and risk of over diagnosis. yes  Discussion included meaning and risk of False Positives. yes  Discussion included meaning of total radiation exposure. yes  Counseling Included:  Importance of adherence to annual lung cancer LDCT screening. yes  Impact of comorbidities on ability to participate in the program. yes  Ability and willingness to under diagnostic treatment. yes  Smoking Cessation Counseling:  Current Smokers:   Discussed importance of smoking cessation. no  Information about tobacco cessation classes and interventions provided to patient. yes  Patient provided with "ticket" for LDCT Scan. yes  Symptomatic Patient. no  Counseling  Diagnosis Code: Tobacco Use Z72.0  Asymptomatic Patient yes  Counseling (Intermediate counseling: > three minutes counseling) K2409  Former Smokers:   Discussed the importance of maintaining cigarette abstinence. yes  Diagnosis Code: Personal History of Nicotine Dependence. B35.329  Information about tobacco cessation classes and interventions provided to patient. Yes  Patient provided with "ticket" for LDCT Scan. yes  Written Order for Lung  Cancer Screening with LDCT placed in Epic. Yes (CT Chest Lung Cancer Screening Low Dose W/O CM) [id number] Z12.2-Screening of respiratory organs Z87.891-Personal history of nicotine dependence  I spent 25 minutes of face to face time with Ms. Niebla discussing the risks and benefits of lung cancer screening. We viewed a power point together that explained in detail the above noted topics. We took the time to pause the power point at intervals to allow for questions to be asked and answered to ensure understanding. We discussed that she had taken the single most powerful action possible to decrease her risk of developing lung cancer when she quit smoking. I counseled her to remain smoke free, and to contact me if she ever had the desire to smoke again so that I can provide resources and tools to help support the effort to remain smoke free. We discussed the time and location of the scan, and that either  Doroteo Glassman RN or I will call with the results within  24-48 hours of receiving them. She has my card and contact information in the event she needs to speak with me, in addition to a copy of the power point we reviewed as a resource. She verbalized understanding of all of the above and had no further questions upon leaving the office.     I explained to the patient that there has been a high incidence of coronary artery disease noted on these exams. I explained that this is a non-gated exam therefore degree or severity cannot be determined. This patient is  not on statin therapy. I have asked the patient to follow-up with their PCP regarding any incidental finding of coronary artery disease and management with diet or medication as they feel is  clinically indicated. The patient verbalized understanding of the above and had no further questions.   Magdalen Spatz, NP  07/20/2017 12:29 PM

## 2017-07-26 DIAGNOSIS — M5416 Radiculopathy, lumbar region: Secondary | ICD-10-CM | POA: Diagnosis not present

## 2017-07-26 DIAGNOSIS — R262 Difficulty in walking, not elsewhere classified: Secondary | ICD-10-CM | POA: Diagnosis not present

## 2017-07-26 DIAGNOSIS — M545 Low back pain: Secondary | ICD-10-CM | POA: Diagnosis not present

## 2017-07-26 DIAGNOSIS — M549 Dorsalgia, unspecified: Secondary | ICD-10-CM | POA: Diagnosis not present

## 2017-08-02 ENCOUNTER — Telehealth: Payer: Self-pay | Admitting: Acute Care

## 2017-08-02 DIAGNOSIS — Z122 Encounter for screening for malignant neoplasm of respiratory organs: Secondary | ICD-10-CM

## 2017-08-02 DIAGNOSIS — Z87891 Personal history of nicotine dependence: Secondary | ICD-10-CM

## 2017-08-02 NOTE — Telephone Encounter (Signed)
Pt returned my call. I explained that her scan was read as a Lung RADS 4 A : suspicious findings, either short term follow up in 3 months or alternatively  PET Scan evaluation may be considered when there is a solid component of  8 mm or larger. I told her I had Dr. Lamonte Sakai review the scan and he is in agreement with a 3 month follow up. She verbalized understanding.  Langley Gauss, please place a follow up Ct for 09/2017. Thanks so much.

## 2017-08-02 NOTE — Telephone Encounter (Signed)
I called the patient. There was no answer. I have left a message on the answering machine with our contact information requesting that the patient call for her results.

## 2017-08-03 NOTE — Telephone Encounter (Signed)
Copy of CT faxed to PCP.  Order placed for 3 mth f/u Chest ct.

## 2017-08-09 DIAGNOSIS — M545 Low back pain: Secondary | ICD-10-CM | POA: Diagnosis not present

## 2017-08-09 DIAGNOSIS — R262 Difficulty in walking, not elsewhere classified: Secondary | ICD-10-CM | POA: Diagnosis not present

## 2017-08-09 DIAGNOSIS — M5416 Radiculopathy, lumbar region: Secondary | ICD-10-CM | POA: Diagnosis not present

## 2017-08-09 DIAGNOSIS — M549 Dorsalgia, unspecified: Secondary | ICD-10-CM | POA: Diagnosis not present

## 2017-08-11 DIAGNOSIS — L718 Other rosacea: Secondary | ICD-10-CM | POA: Diagnosis not present

## 2017-08-11 DIAGNOSIS — Z85828 Personal history of other malignant neoplasm of skin: Secondary | ICD-10-CM | POA: Diagnosis not present

## 2017-08-11 DIAGNOSIS — L732 Hidradenitis suppurativa: Secondary | ICD-10-CM | POA: Diagnosis not present

## 2017-08-16 DIAGNOSIS — M545 Low back pain: Secondary | ICD-10-CM | POA: Diagnosis not present

## 2017-08-16 DIAGNOSIS — M5416 Radiculopathy, lumbar region: Secondary | ICD-10-CM | POA: Diagnosis not present

## 2017-08-16 DIAGNOSIS — R262 Difficulty in walking, not elsewhere classified: Secondary | ICD-10-CM | POA: Diagnosis not present

## 2017-08-16 DIAGNOSIS — M549 Dorsalgia, unspecified: Secondary | ICD-10-CM | POA: Diagnosis not present

## 2017-08-31 DIAGNOSIS — M545 Low back pain: Secondary | ICD-10-CM | POA: Diagnosis not present

## 2017-08-31 DIAGNOSIS — M549 Dorsalgia, unspecified: Secondary | ICD-10-CM | POA: Diagnosis not present

## 2017-08-31 DIAGNOSIS — R262 Difficulty in walking, not elsewhere classified: Secondary | ICD-10-CM | POA: Diagnosis not present

## 2017-08-31 DIAGNOSIS — M5416 Radiculopathy, lumbar region: Secondary | ICD-10-CM | POA: Diagnosis not present

## 2017-09-09 DIAGNOSIS — L281 Prurigo nodularis: Secondary | ICD-10-CM | POA: Diagnosis not present

## 2017-09-09 DIAGNOSIS — D225 Melanocytic nevi of trunk: Secondary | ICD-10-CM | POA: Diagnosis not present

## 2017-09-09 DIAGNOSIS — D1801 Hemangioma of skin and subcutaneous tissue: Secondary | ICD-10-CM | POA: Diagnosis not present

## 2017-09-09 DIAGNOSIS — L718 Other rosacea: Secondary | ICD-10-CM | POA: Diagnosis not present

## 2017-09-09 DIAGNOSIS — L820 Inflamed seborrheic keratosis: Secondary | ICD-10-CM | POA: Diagnosis not present

## 2017-09-09 DIAGNOSIS — D2272 Melanocytic nevi of left lower limb, including hip: Secondary | ICD-10-CM | POA: Diagnosis not present

## 2017-09-09 DIAGNOSIS — L814 Other melanin hyperpigmentation: Secondary | ICD-10-CM | POA: Diagnosis not present

## 2017-09-09 DIAGNOSIS — L732 Hidradenitis suppurativa: Secondary | ICD-10-CM | POA: Diagnosis not present

## 2017-09-09 DIAGNOSIS — Z85828 Personal history of other malignant neoplasm of skin: Secondary | ICD-10-CM | POA: Diagnosis not present

## 2017-09-09 DIAGNOSIS — L821 Other seborrheic keratosis: Secondary | ICD-10-CM | POA: Diagnosis not present

## 2017-09-13 ENCOUNTER — Telehealth: Payer: Self-pay | Admitting: Podiatry

## 2017-09-13 NOTE — Telephone Encounter (Signed)
Called pt to let her know that her requested medical records are ready to be picked up and that I was placing them at the front desk. I reminded her she would need to fill out and sign the medical records release form. I told the pt she could come anytime until 5:00 pm today at her convenience. Pt stated she would be in after lunch to get her records. I reminded her of our new address. Pt thanked me very much for getting this done for her.

## 2017-09-13 NOTE — Telephone Encounter (Signed)
I called the pt back to see what records she is wanting as she has been a pt for a long time. Pt stated anything to do with her left foot and left achilles so probably anything from the past several years. I told Tammy Mccoy I would go ahead and start on this records request and I would call her when I had them ready for her to pick up. I told her when she comes in to pick them up she would need to fill out and sign a medical records release form. Pt again apologized saying she has had this appointment scheduled for a month and woke up this morning and realized she had not gotten her records yet. I told the pt I would speak with her later. Pt thanked me for getting this taken care of for her.

## 2017-09-13 NOTE — Telephone Encounter (Signed)
I was calling to get my medical records and I didn't realize I had to have 7 to 10 days. My appointment with this other doctor is tomorrow. I was wondering if there was anyway please I could pick them up later this afternoon. I just need a copy of my records and any x-rays taken. Dr. Paulla Dolly is my doctor and I'm going tomorrow to see an orthopaedic doctor for a second opinion just to see what he has to say. My phone number is [phone]. If there is anyway somebody could call me back to let me know if its even possible to get them this afternoon I would greatly appreciate it. I apologize for the short notice but I'm old and I forgot to do that. So anyway, thank you so much for your time. If you would call me back and let me know I would appreciate it. Bye bye.

## 2017-09-14 DIAGNOSIS — M6702 Short Achilles tendon (acquired), left ankle: Secondary | ICD-10-CM | POA: Diagnosis not present

## 2017-09-14 DIAGNOSIS — M67879 Other specified disorders of synovium and tendon, unspecified ankle and foot: Secondary | ICD-10-CM | POA: Diagnosis not present

## 2017-09-14 DIAGNOSIS — M898X7 Other specified disorders of bone, ankle and foot: Secondary | ICD-10-CM | POA: Diagnosis not present

## 2017-09-14 DIAGNOSIS — M67872 Other specified disorders of synovium, left ankle and foot: Secondary | ICD-10-CM | POA: Diagnosis not present

## 2017-09-21 DIAGNOSIS — R262 Difficulty in walking, not elsewhere classified: Secondary | ICD-10-CM | POA: Diagnosis not present

## 2017-09-21 DIAGNOSIS — M25572 Pain in left ankle and joints of left foot: Secondary | ICD-10-CM | POA: Diagnosis not present

## 2017-09-21 DIAGNOSIS — M25675 Stiffness of left foot, not elsewhere classified: Secondary | ICD-10-CM | POA: Diagnosis not present

## 2017-09-21 DIAGNOSIS — M62572 Muscle wasting and atrophy, not elsewhere classified, left ankle and foot: Secondary | ICD-10-CM | POA: Diagnosis not present

## 2017-09-27 DIAGNOSIS — R262 Difficulty in walking, not elsewhere classified: Secondary | ICD-10-CM | POA: Diagnosis not present

## 2017-09-27 DIAGNOSIS — M25675 Stiffness of left foot, not elsewhere classified: Secondary | ICD-10-CM | POA: Diagnosis not present

## 2017-09-27 DIAGNOSIS — M25572 Pain in left ankle and joints of left foot: Secondary | ICD-10-CM | POA: Diagnosis not present

## 2017-09-27 DIAGNOSIS — M62572 Muscle wasting and atrophy, not elsewhere classified, left ankle and foot: Secondary | ICD-10-CM | POA: Diagnosis not present

## 2017-09-30 DIAGNOSIS — M25572 Pain in left ankle and joints of left foot: Secondary | ICD-10-CM | POA: Diagnosis not present

## 2017-09-30 DIAGNOSIS — M62572 Muscle wasting and atrophy, not elsewhere classified, left ankle and foot: Secondary | ICD-10-CM | POA: Diagnosis not present

## 2017-09-30 DIAGNOSIS — R262 Difficulty in walking, not elsewhere classified: Secondary | ICD-10-CM | POA: Diagnosis not present

## 2017-09-30 DIAGNOSIS — M25675 Stiffness of left foot, not elsewhere classified: Secondary | ICD-10-CM | POA: Diagnosis not present

## 2017-10-04 DIAGNOSIS — M62572 Muscle wasting and atrophy, not elsewhere classified, left ankle and foot: Secondary | ICD-10-CM | POA: Diagnosis not present

## 2017-10-04 DIAGNOSIS — R262 Difficulty in walking, not elsewhere classified: Secondary | ICD-10-CM | POA: Diagnosis not present

## 2017-10-04 DIAGNOSIS — M25572 Pain in left ankle and joints of left foot: Secondary | ICD-10-CM | POA: Diagnosis not present

## 2017-10-04 DIAGNOSIS — M25675 Stiffness of left foot, not elsewhere classified: Secondary | ICD-10-CM | POA: Diagnosis not present

## 2017-10-12 DIAGNOSIS — M25572 Pain in left ankle and joints of left foot: Secondary | ICD-10-CM | POA: Diagnosis not present

## 2017-10-12 DIAGNOSIS — M25675 Stiffness of left foot, not elsewhere classified: Secondary | ICD-10-CM | POA: Diagnosis not present

## 2017-10-12 DIAGNOSIS — M62572 Muscle wasting and atrophy, not elsewhere classified, left ankle and foot: Secondary | ICD-10-CM | POA: Diagnosis not present

## 2017-10-12 DIAGNOSIS — R262 Difficulty in walking, not elsewhere classified: Secondary | ICD-10-CM | POA: Diagnosis not present

## 2017-10-14 DIAGNOSIS — M25675 Stiffness of left foot, not elsewhere classified: Secondary | ICD-10-CM | POA: Diagnosis not present

## 2017-10-14 DIAGNOSIS — M62572 Muscle wasting and atrophy, not elsewhere classified, left ankle and foot: Secondary | ICD-10-CM | POA: Diagnosis not present

## 2017-10-14 DIAGNOSIS — R262 Difficulty in walking, not elsewhere classified: Secondary | ICD-10-CM | POA: Diagnosis not present

## 2017-10-14 DIAGNOSIS — M25572 Pain in left ankle and joints of left foot: Secondary | ICD-10-CM | POA: Diagnosis not present

## 2017-10-20 DIAGNOSIS — M62572 Muscle wasting and atrophy, not elsewhere classified, left ankle and foot: Secondary | ICD-10-CM | POA: Diagnosis not present

## 2017-10-20 DIAGNOSIS — R262 Difficulty in walking, not elsewhere classified: Secondary | ICD-10-CM | POA: Diagnosis not present

## 2017-10-20 DIAGNOSIS — M25675 Stiffness of left foot, not elsewhere classified: Secondary | ICD-10-CM | POA: Diagnosis not present

## 2017-10-20 DIAGNOSIS — M25572 Pain in left ankle and joints of left foot: Secondary | ICD-10-CM | POA: Diagnosis not present

## 2017-10-21 ENCOUNTER — Ambulatory Visit (INDEPENDENT_AMBULATORY_CARE_PROVIDER_SITE_OTHER)
Admission: RE | Admit: 2017-10-21 | Discharge: 2017-10-21 | Disposition: A | Discharge status: Home / Self Care | Payer: PPO | Source: Ambulatory Visit | Attending: Acute Care | Admitting: Acute Care

## 2017-10-21 DIAGNOSIS — R918 Other nonspecific abnormal finding of lung field: Secondary | ICD-10-CM | POA: Diagnosis not present

## 2017-10-21 DIAGNOSIS — J439 Emphysema, unspecified: Secondary | ICD-10-CM | POA: Diagnosis not present

## 2017-10-21 DIAGNOSIS — Z122 Encounter for screening for malignant neoplasm of respiratory organs: Secondary | ICD-10-CM

## 2017-10-21 DIAGNOSIS — Z87891 Personal history of nicotine dependence: Secondary | ICD-10-CM

## 2017-10-24 DIAGNOSIS — M62572 Muscle wasting and atrophy, not elsewhere classified, left ankle and foot: Secondary | ICD-10-CM | POA: Diagnosis not present

## 2017-10-24 DIAGNOSIS — M25572 Pain in left ankle and joints of left foot: Secondary | ICD-10-CM | POA: Diagnosis not present

## 2017-10-24 DIAGNOSIS — R262 Difficulty in walking, not elsewhere classified: Secondary | ICD-10-CM | POA: Diagnosis not present

## 2017-10-24 DIAGNOSIS — M25675 Stiffness of left foot, not elsewhere classified: Secondary | ICD-10-CM | POA: Diagnosis not present

## 2017-10-25 DIAGNOSIS — R262 Difficulty in walking, not elsewhere classified: Secondary | ICD-10-CM | POA: Diagnosis not present

## 2017-10-25 DIAGNOSIS — M25675 Stiffness of left foot, not elsewhere classified: Secondary | ICD-10-CM | POA: Diagnosis not present

## 2017-10-25 DIAGNOSIS — M25572 Pain in left ankle and joints of left foot: Secondary | ICD-10-CM | POA: Diagnosis not present

## 2017-10-25 DIAGNOSIS — M62572 Muscle wasting and atrophy, not elsewhere classified, left ankle and foot: Secondary | ICD-10-CM | POA: Diagnosis not present

## 2017-10-26 ENCOUNTER — Telehealth: Payer: Self-pay | Admitting: Acute Care

## 2017-10-26 DIAGNOSIS — M62572 Muscle wasting and atrophy, not elsewhere classified, left ankle and foot: Secondary | ICD-10-CM | POA: Diagnosis not present

## 2017-10-26 DIAGNOSIS — M25572 Pain in left ankle and joints of left foot: Secondary | ICD-10-CM | POA: Diagnosis not present

## 2017-10-26 DIAGNOSIS — Z122 Encounter for screening for malignant neoplasm of respiratory organs: Secondary | ICD-10-CM

## 2017-10-26 DIAGNOSIS — M25675 Stiffness of left foot, not elsewhere classified: Secondary | ICD-10-CM | POA: Diagnosis not present

## 2017-10-26 DIAGNOSIS — Z87891 Personal history of nicotine dependence: Secondary | ICD-10-CM

## 2017-10-26 DIAGNOSIS — R262 Difficulty in walking, not elsewhere classified: Secondary | ICD-10-CM | POA: Diagnosis not present

## 2017-10-26 NOTE — Telephone Encounter (Signed)
Sarah, please advise on results.  This was a 3 mth f/u on a Lung RADS 4.

## 2017-10-26 NOTE — Telephone Encounter (Signed)
Pt informed of CT results per Sarah Groce, NP.  PT verbalized understanding.  Copy sent to PCP.  Order placed for 1 yr f/u CT.  

## 2017-11-01 DIAGNOSIS — R1032 Left lower quadrant pain: Secondary | ICD-10-CM | POA: Diagnosis not present

## 2017-11-01 DIAGNOSIS — K5792 Diverticulitis of intestine, part unspecified, without perforation or abscess without bleeding: Secondary | ICD-10-CM | POA: Diagnosis not present

## 2017-11-07 DIAGNOSIS — K8020 Calculus of gallbladder without cholecystitis without obstruction: Secondary | ICD-10-CM | POA: Diagnosis not present

## 2017-11-07 DIAGNOSIS — F325 Major depressive disorder, single episode, in full remission: Secondary | ICD-10-CM | POA: Diagnosis not present

## 2017-11-07 DIAGNOSIS — G4700 Insomnia, unspecified: Secondary | ICD-10-CM | POA: Diagnosis not present

## 2017-11-17 DIAGNOSIS — M67872 Other specified disorders of synovium, left ankle and foot: Secondary | ICD-10-CM | POA: Diagnosis not present

## 2017-11-21 DIAGNOSIS — H524 Presbyopia: Secondary | ICD-10-CM | POA: Diagnosis not present

## 2018-01-19 DIAGNOSIS — K5730 Diverticulosis of large intestine without perforation or abscess without bleeding: Secondary | ICD-10-CM | POA: Diagnosis not present

## 2018-01-19 DIAGNOSIS — Z86010 Personal history of colonic polyps: Secondary | ICD-10-CM | POA: Diagnosis not present

## 2018-01-19 DIAGNOSIS — Z1211 Encounter for screening for malignant neoplasm of colon: Secondary | ICD-10-CM | POA: Diagnosis not present

## 2018-01-19 DIAGNOSIS — K5904 Chronic idiopathic constipation: Secondary | ICD-10-CM | POA: Diagnosis not present

## 2018-03-06 DIAGNOSIS — Z1231 Encounter for screening mammogram for malignant neoplasm of breast: Secondary | ICD-10-CM | POA: Diagnosis not present

## 2018-03-06 DIAGNOSIS — Z803 Family history of malignant neoplasm of breast: Secondary | ICD-10-CM | POA: Diagnosis not present

## 2018-03-08 DIAGNOSIS — Z8639 Personal history of other endocrine, nutritional and metabolic disease: Secondary | ICD-10-CM | POA: Diagnosis not present

## 2018-03-08 DIAGNOSIS — Z803 Family history of malignant neoplasm of breast: Secondary | ICD-10-CM | POA: Diagnosis not present

## 2018-03-08 DIAGNOSIS — E042 Nontoxic multinodular goiter: Secondary | ICD-10-CM | POA: Diagnosis not present

## 2018-03-08 DIAGNOSIS — Z808 Family history of malignant neoplasm of other organs or systems: Secondary | ICD-10-CM | POA: Diagnosis not present

## 2018-03-08 DIAGNOSIS — Z801 Family history of malignant neoplasm of trachea, bronchus and lung: Secondary | ICD-10-CM | POA: Diagnosis not present

## 2018-03-08 DIAGNOSIS — Z8052 Family history of malignant neoplasm of bladder: Secondary | ICD-10-CM | POA: Diagnosis not present

## 2018-03-08 DIAGNOSIS — Z8349 Family history of other endocrine, nutritional and metabolic diseases: Secondary | ICD-10-CM | POA: Diagnosis not present

## 2018-03-08 DIAGNOSIS — E669 Obesity, unspecified: Secondary | ICD-10-CM | POA: Diagnosis not present

## 2018-03-09 DIAGNOSIS — N6312 Unspecified lump in the right breast, upper inner quadrant: Secondary | ICD-10-CM | POA: Diagnosis not present

## 2018-03-30 DIAGNOSIS — L218 Other seborrheic dermatitis: Secondary | ICD-10-CM | POA: Diagnosis not present

## 2018-03-30 DIAGNOSIS — L304 Erythema intertrigo: Secondary | ICD-10-CM | POA: Diagnosis not present

## 2018-03-30 DIAGNOSIS — Z85828 Personal history of other malignant neoplasm of skin: Secondary | ICD-10-CM | POA: Diagnosis not present

## 2018-03-30 DIAGNOSIS — L570 Actinic keratosis: Secondary | ICD-10-CM | POA: Diagnosis not present

## 2018-04-26 DIAGNOSIS — K5730 Diverticulosis of large intestine without perforation or abscess without bleeding: Secondary | ICD-10-CM | POA: Diagnosis not present

## 2018-04-26 DIAGNOSIS — D125 Benign neoplasm of sigmoid colon: Secondary | ICD-10-CM | POA: Diagnosis not present

## 2018-04-26 DIAGNOSIS — Z1211 Encounter for screening for malignant neoplasm of colon: Secondary | ICD-10-CM | POA: Diagnosis not present

## 2018-04-26 DIAGNOSIS — Z86010 Personal history of colonic polyps: Secondary | ICD-10-CM | POA: Diagnosis not present

## 2018-04-26 DIAGNOSIS — K635 Polyp of colon: Secondary | ICD-10-CM | POA: Diagnosis not present

## 2018-05-24 DIAGNOSIS — J309 Allergic rhinitis, unspecified: Secondary | ICD-10-CM | POA: Diagnosis not present

## 2018-05-24 DIAGNOSIS — Z124 Encounter for screening for malignant neoplasm of cervix: Secondary | ICD-10-CM | POA: Diagnosis not present

## 2018-05-24 DIAGNOSIS — E559 Vitamin D deficiency, unspecified: Secondary | ICD-10-CM | POA: Diagnosis not present

## 2018-05-24 DIAGNOSIS — F419 Anxiety disorder, unspecified: Secondary | ICD-10-CM | POA: Diagnosis not present

## 2018-05-24 DIAGNOSIS — Z0000 Encounter for general adult medical examination without abnormal findings: Secondary | ICD-10-CM | POA: Diagnosis not present

## 2018-05-24 DIAGNOSIS — F325 Major depressive disorder, single episode, in full remission: Secondary | ICD-10-CM | POA: Diagnosis not present

## 2018-06-28 DIAGNOSIS — M1712 Unilateral primary osteoarthritis, left knee: Secondary | ICD-10-CM | POA: Diagnosis not present

## 2018-07-14 IMAGING — CT CT CHEST LUNG CANCER SCREENING LOW DOSE W/O CM
1 of 4 series · 10 of 40 positions shown, 13 images · non-contrast
Comparison: 08/29/2013 chest radiograph.

CLINICAL DATA: 66-year-old asymptomatic female former smoker with
30 pack-year smoking history, quit smoking in 6121.

EXAM:
CT CHEST WITHOUT CONTRAST LOW-DOSE FOR LUNG CANCER SCREENING
TECHNIQUE: Multidetector CT imaging of the chest was performed following the
standard protocol without IV contrast.

[ct lung segmentation data · axial · 0.80mm/px · z∈[-358,-358]mm · 10 of 327 frames shown]
[frame 1/327  mediastinal]
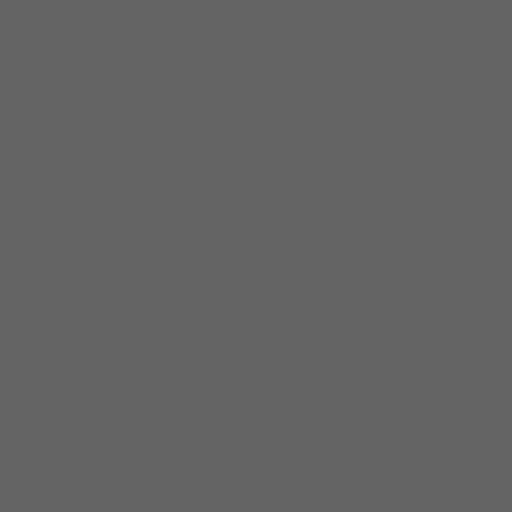
[frame 1/327  lung]
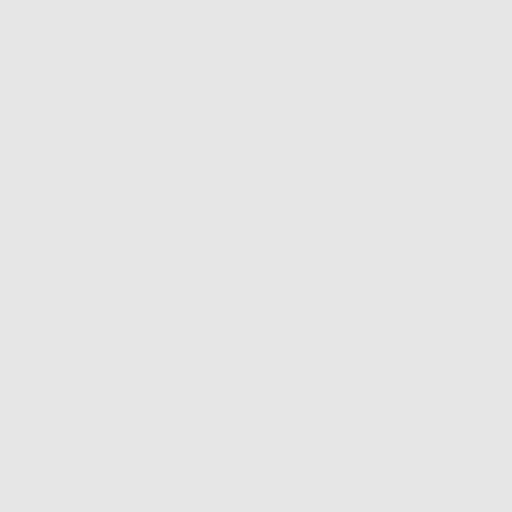
[frame 37/327  lung]
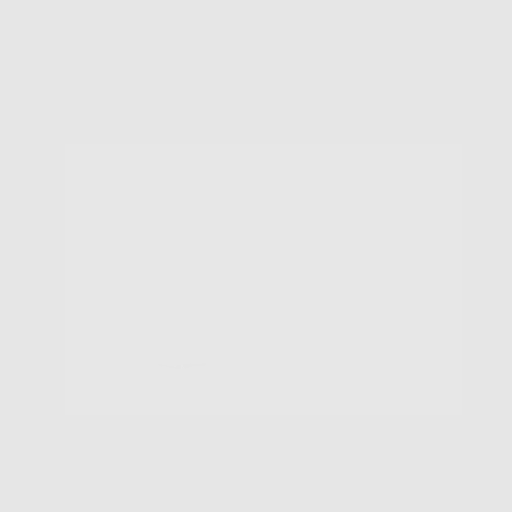
[frame 73/327  lung]
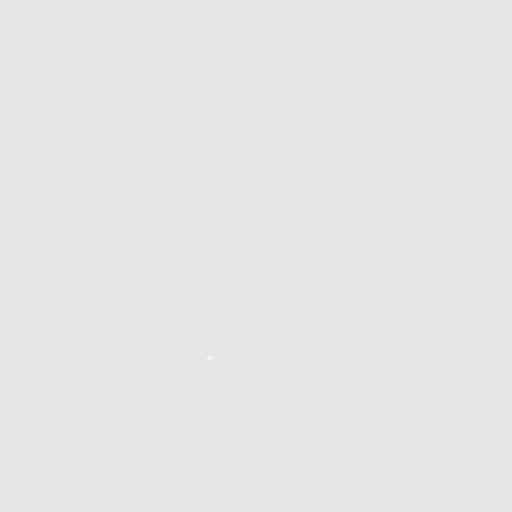
[frame 109/327  lung]
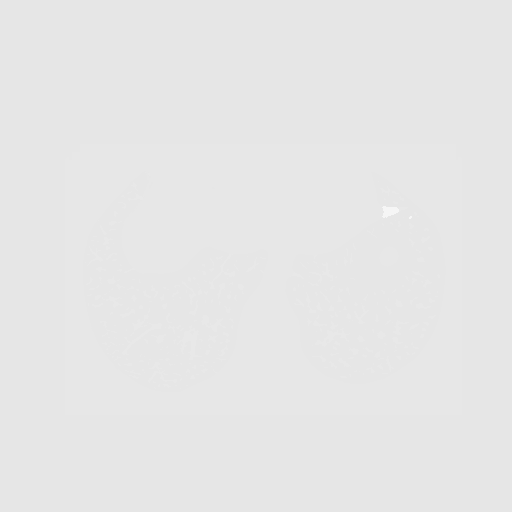
[frame 145/327  mediastinal]
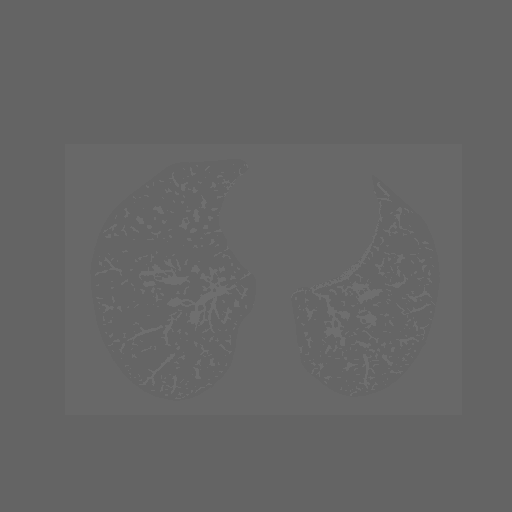
[frame 145/327  lung]
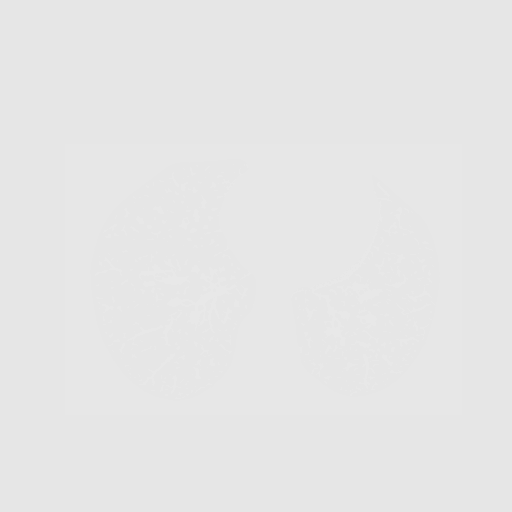
[frame 182/327  lung]
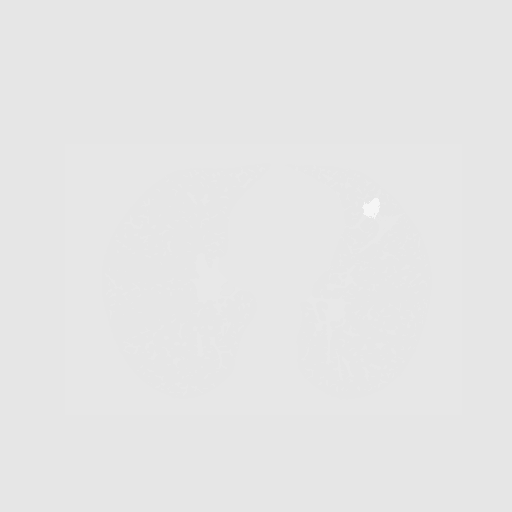
[frame 218/327  lung]
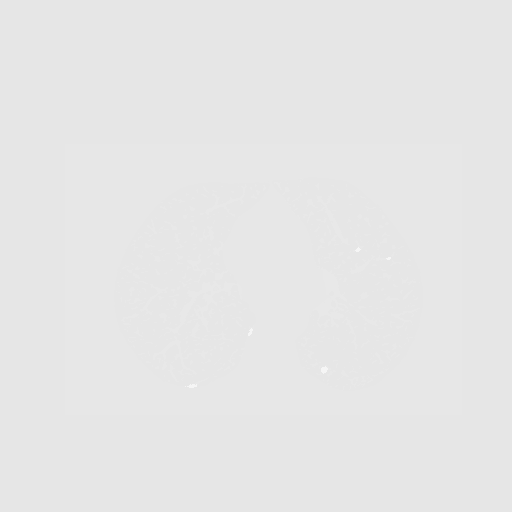
[frame 254/327  lung]
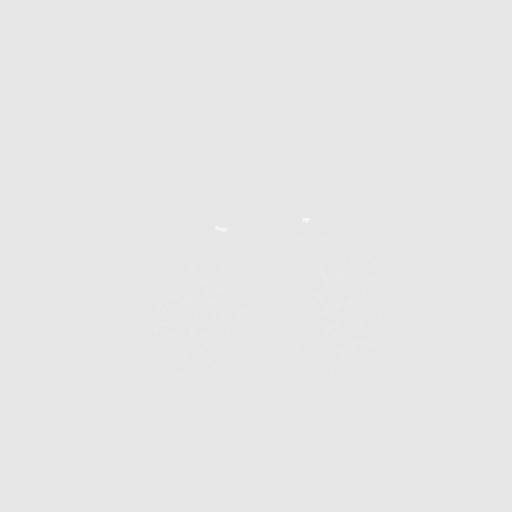
[frame 290/327  mediastinal]
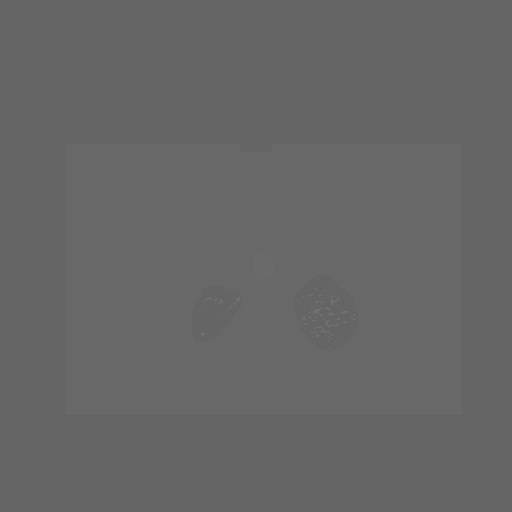
[frame 290/327  lung]
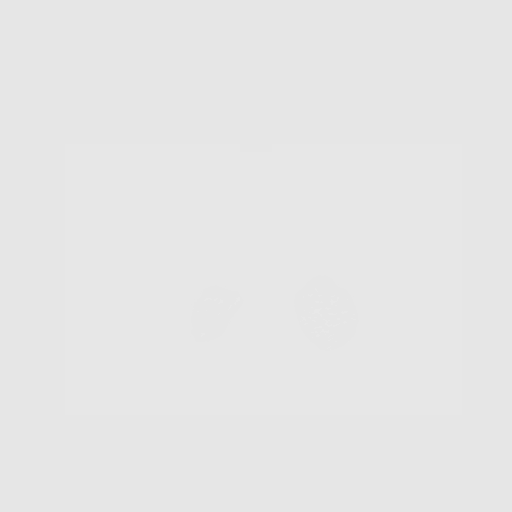
[frame 327/327  lung]
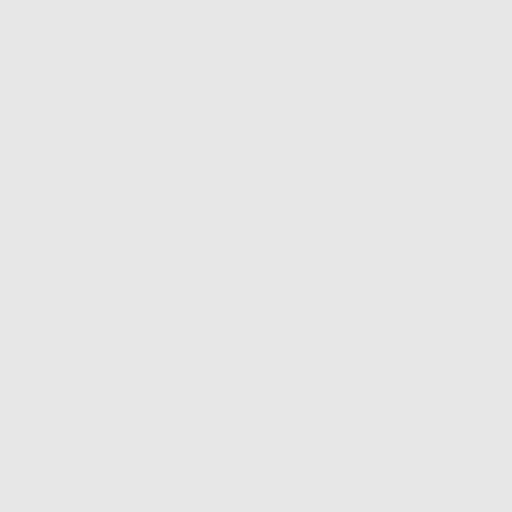

[10 of 40 positions shown; findings below may reference images not displayed]

FINDINGS: Cardiovascular: Normal heart size. No significant pericardial
effusion/thickening. Right coronary atherosclerosis. Atherosclerotic
nonaneurysmal thoracic aorta. Normal caliber pulmonary arteries.

Mediastinum/Nodes: No discrete thyroid nodules. Unremarkable
esophagus. No pathologically enlarged axillary, mediastinal or hilar
lymph nodes, noting limited sensitivity for the detection of hilar
adenopathy on this noncontrast study.

Lungs/Pleura: No pneumothorax. No pleural effusion. No acute
consolidative airspace disease or lung masses. There are several
scattered solid pulmonary nodules in both lungs measuring up to
mm in volume derived mean diameter in the central left upper lobe
(series 3/image 137).

Upper abdomen: No acute abnormality.

Musculoskeletal: No aggressive appearing focal osseous lesions.
Moderate thoracic spondylosis.
IMPRESSION: 1. Lung-RADS 4A, suspicious. Dominant 10.1 mm central left upper
lobe solid pulmonary nodule. Follow up low-dose chest CT without
contrast in 3 months (please use the following order, "CT CHEST LCS
NODULE FOLLOW-UP W/O CM") is recommended. Alternatively, PET may be
considered when there is a solid component 8mm or larger.
2. One vessel coronary atherosclerosis.

Aortic Atherosclerosis (VASUK-GF1.1).

## 2018-10-06 DIAGNOSIS — H43392 Other vitreous opacities, left eye: Secondary | ICD-10-CM | POA: Diagnosis not present

## 2018-10-06 DIAGNOSIS — H2513 Age-related nuclear cataract, bilateral: Secondary | ICD-10-CM | POA: Diagnosis not present

## 2018-10-06 DIAGNOSIS — H52223 Regular astigmatism, bilateral: Secondary | ICD-10-CM | POA: Diagnosis not present

## 2018-10-06 DIAGNOSIS — H43811 Vitreous degeneration, right eye: Secondary | ICD-10-CM | POA: Diagnosis not present

## 2018-10-06 DIAGNOSIS — H524 Presbyopia: Secondary | ICD-10-CM | POA: Diagnosis not present

## 2018-10-06 DIAGNOSIS — H04123 Dry eye syndrome of bilateral lacrimal glands: Secondary | ICD-10-CM | POA: Diagnosis not present

## 2018-10-12 DIAGNOSIS — N6310 Unspecified lump in the right breast, unspecified quadrant: Secondary | ICD-10-CM | POA: Diagnosis not present

## 2018-10-25 DIAGNOSIS — Z85828 Personal history of other malignant neoplasm of skin: Secondary | ICD-10-CM | POA: Diagnosis not present

## 2018-10-25 DIAGNOSIS — L738 Other specified follicular disorders: Secondary | ICD-10-CM | POA: Diagnosis not present

## 2018-10-25 DIAGNOSIS — L820 Inflamed seborrheic keratosis: Secondary | ICD-10-CM | POA: Diagnosis not present

## 2018-10-25 DIAGNOSIS — L821 Other seborrheic keratosis: Secondary | ICD-10-CM | POA: Diagnosis not present

## 2018-10-25 DIAGNOSIS — L718 Other rosacea: Secondary | ICD-10-CM | POA: Diagnosis not present

## 2018-10-25 DIAGNOSIS — D1801 Hemangioma of skin and subcutaneous tissue: Secondary | ICD-10-CM | POA: Diagnosis not present

## 2018-10-25 DIAGNOSIS — L218 Other seborrheic dermatitis: Secondary | ICD-10-CM | POA: Diagnosis not present

## 2018-10-25 DIAGNOSIS — D2272 Melanocytic nevi of left lower limb, including hip: Secondary | ICD-10-CM | POA: Diagnosis not present

## 2018-10-25 DIAGNOSIS — L814 Other melanin hyperpigmentation: Secondary | ICD-10-CM | POA: Diagnosis not present

## 2018-11-17 DIAGNOSIS — H16223 Keratoconjunctivitis sicca, not specified as Sjogren's, bilateral: Secondary | ICD-10-CM | POA: Diagnosis not present

## 2018-11-17 DIAGNOSIS — H43811 Vitreous degeneration, right eye: Secondary | ICD-10-CM | POA: Diagnosis not present

## 2018-11-17 DIAGNOSIS — H43392 Other vitreous opacities, left eye: Secondary | ICD-10-CM | POA: Diagnosis not present

## 2018-11-17 DIAGNOSIS — H40013 Open angle with borderline findings, low risk, bilateral: Secondary | ICD-10-CM | POA: Diagnosis not present

## 2018-11-17 DIAGNOSIS — H04123 Dry eye syndrome of bilateral lacrimal glands: Secondary | ICD-10-CM | POA: Diagnosis not present

## 2018-11-20 ENCOUNTER — Other Ambulatory Visit: Payer: Self-pay

## 2018-11-20 ENCOUNTER — Encounter (INDEPENDENT_AMBULATORY_CARE_PROVIDER_SITE_OTHER): Payer: Self-pay

## 2018-11-20 ENCOUNTER — Ambulatory Visit (INDEPENDENT_AMBULATORY_CARE_PROVIDER_SITE_OTHER)
Admission: RE | Admit: 2018-11-20 | Discharge: 2018-11-20 | Disposition: A | Discharge status: Home / Self Care | Payer: PPO | Source: Ambulatory Visit | Attending: Acute Care | Admitting: Acute Care

## 2018-11-20 DIAGNOSIS — Z87891 Personal history of nicotine dependence: Secondary | ICD-10-CM

## 2018-11-20 DIAGNOSIS — Z122 Encounter for screening for malignant neoplasm of respiratory organs: Secondary | ICD-10-CM

## 2018-11-28 ENCOUNTER — Telehealth: Payer: Self-pay | Admitting: Acute Care

## 2018-11-28 DIAGNOSIS — Z87891 Personal history of nicotine dependence: Secondary | ICD-10-CM

## 2018-11-28 DIAGNOSIS — F325 Major depressive disorder, single episode, in full remission: Secondary | ICD-10-CM | POA: Diagnosis not present

## 2018-11-28 DIAGNOSIS — N3281 Overactive bladder: Secondary | ICD-10-CM | POA: Diagnosis not present

## 2018-11-28 DIAGNOSIS — Z122 Encounter for screening for malignant neoplasm of respiratory organs: Secondary | ICD-10-CM

## 2018-11-28 DIAGNOSIS — G4730 Sleep apnea, unspecified: Secondary | ICD-10-CM | POA: Diagnosis not present

## 2018-11-28 DIAGNOSIS — F419 Anxiety disorder, unspecified: Secondary | ICD-10-CM | POA: Diagnosis not present

## 2018-11-28 NOTE — Telephone Encounter (Signed)
Pt informed of CT results per Sarah Groce, NP.  PT verbalized understanding.  Copy sent to PCP.  Order placed for 1 yr f/u CT.  

## 2018-11-28 NOTE — Telephone Encounter (Signed)
LMTC x 1 to discuss CT chest results

## 2018-12-12 DIAGNOSIS — M13861 Other specified arthritis, right knee: Secondary | ICD-10-CM | POA: Diagnosis not present

## 2018-12-12 DIAGNOSIS — M25561 Pain in right knee: Secondary | ICD-10-CM | POA: Diagnosis not present

## 2018-12-20 DIAGNOSIS — H40033 Anatomical narrow angle, bilateral: Secondary | ICD-10-CM | POA: Diagnosis not present

## 2018-12-20 DIAGNOSIS — H04123 Dry eye syndrome of bilateral lacrimal glands: Secondary | ICD-10-CM | POA: Diagnosis not present

## 2018-12-20 DIAGNOSIS — H2513 Age-related nuclear cataract, bilateral: Secondary | ICD-10-CM | POA: Diagnosis not present

## 2018-12-20 DIAGNOSIS — H40013 Open angle with borderline findings, low risk, bilateral: Secondary | ICD-10-CM | POA: Diagnosis not present

## 2018-12-20 DIAGNOSIS — H16223 Keratoconjunctivitis sicca, not specified as Sjogren's, bilateral: Secondary | ICD-10-CM | POA: Diagnosis not present

## 2019-01-04 DIAGNOSIS — Z20828 Contact with and (suspected) exposure to other viral communicable diseases: Secondary | ICD-10-CM | POA: Diagnosis not present

## 2019-03-13 DIAGNOSIS — M1711 Unilateral primary osteoarthritis, right knee: Secondary | ICD-10-CM | POA: Diagnosis not present

## 2019-03-13 DIAGNOSIS — M25561 Pain in right knee: Secondary | ICD-10-CM | POA: Diagnosis not present

## 2019-03-29 DIAGNOSIS — M1711 Unilateral primary osteoarthritis, right knee: Secondary | ICD-10-CM | POA: Diagnosis not present

## 2019-04-05 DIAGNOSIS — M1711 Unilateral primary osteoarthritis, right knee: Secondary | ICD-10-CM | POA: Diagnosis not present

## 2019-04-06 DIAGNOSIS — M13861 Other specified arthritis, right knee: Secondary | ICD-10-CM | POA: Diagnosis not present

## 2019-04-09 DIAGNOSIS — M13861 Other specified arthritis, right knee: Secondary | ICD-10-CM | POA: Diagnosis not present

## 2019-04-12 DIAGNOSIS — M1711 Unilateral primary osteoarthritis, right knee: Secondary | ICD-10-CM | POA: Diagnosis not present

## 2019-04-13 DIAGNOSIS — M13861 Other specified arthritis, right knee: Secondary | ICD-10-CM | POA: Diagnosis not present

## 2019-04-17 DIAGNOSIS — Z1231 Encounter for screening mammogram for malignant neoplasm of breast: Secondary | ICD-10-CM | POA: Diagnosis not present

## 2019-04-23 DIAGNOSIS — M25561 Pain in right knee: Secondary | ICD-10-CM | POA: Diagnosis not present

## 2019-05-01 DIAGNOSIS — Z8639 Personal history of other endocrine, nutritional and metabolic disease: Secondary | ICD-10-CM | POA: Diagnosis not present

## 2019-05-01 DIAGNOSIS — E042 Nontoxic multinodular goiter: Secondary | ICD-10-CM | POA: Diagnosis not present

## 2019-05-01 DIAGNOSIS — Z8349 Family history of other endocrine, nutritional and metabolic diseases: Secondary | ICD-10-CM | POA: Diagnosis not present

## 2019-05-01 DIAGNOSIS — E669 Obesity, unspecified: Secondary | ICD-10-CM | POA: Diagnosis not present

## 2019-05-01 DIAGNOSIS — Z808 Family history of malignant neoplasm of other organs or systems: Secondary | ICD-10-CM | POA: Diagnosis not present

## 2019-05-24 DIAGNOSIS — I10 Essential (primary) hypertension: Secondary | ICD-10-CM | POA: Diagnosis not present

## 2019-05-24 DIAGNOSIS — R202 Paresthesia of skin: Secondary | ICD-10-CM | POA: Diagnosis not present

## 2019-05-24 DIAGNOSIS — R609 Edema, unspecified: Secondary | ICD-10-CM | POA: Diagnosis not present

## 2019-05-24 DIAGNOSIS — G4730 Sleep apnea, unspecified: Secondary | ICD-10-CM | POA: Diagnosis not present

## 2019-05-24 DIAGNOSIS — F324 Major depressive disorder, single episode, in partial remission: Secondary | ICD-10-CM | POA: Diagnosis not present

## 2019-05-25 DIAGNOSIS — E042 Nontoxic multinodular goiter: Secondary | ICD-10-CM | POA: Diagnosis not present

## 2019-05-25 DIAGNOSIS — Z8639 Personal history of other endocrine, nutritional and metabolic disease: Secondary | ICD-10-CM | POA: Diagnosis not present

## 2019-05-25 DIAGNOSIS — R609 Edema, unspecified: Secondary | ICD-10-CM | POA: Diagnosis not present

## 2019-05-25 DIAGNOSIS — I10 Essential (primary) hypertension: Secondary | ICD-10-CM | POA: Diagnosis not present

## 2019-05-25 DIAGNOSIS — R202 Paresthesia of skin: Secondary | ICD-10-CM | POA: Diagnosis not present

## 2019-06-06 DIAGNOSIS — Z23 Encounter for immunization: Secondary | ICD-10-CM | POA: Diagnosis not present

## 2019-06-06 DIAGNOSIS — R609 Edema, unspecified: Secondary | ICD-10-CM | POA: Diagnosis not present

## 2019-06-06 DIAGNOSIS — I10 Essential (primary) hypertension: Secondary | ICD-10-CM | POA: Diagnosis not present

## 2019-06-06 DIAGNOSIS — F331 Major depressive disorder, recurrent, moderate: Secondary | ICD-10-CM | POA: Diagnosis not present

## 2019-06-20 DIAGNOSIS — H16223 Keratoconjunctivitis sicca, not specified as Sjogren's, bilateral: Secondary | ICD-10-CM | POA: Diagnosis not present

## 2019-06-20 DIAGNOSIS — H43392 Other vitreous opacities, left eye: Secondary | ICD-10-CM | POA: Diagnosis not present

## 2019-06-20 DIAGNOSIS — H2513 Age-related nuclear cataract, bilateral: Secondary | ICD-10-CM | POA: Diagnosis not present

## 2019-06-20 DIAGNOSIS — H43811 Vitreous degeneration, right eye: Secondary | ICD-10-CM | POA: Diagnosis not present

## 2019-06-20 DIAGNOSIS — H04123 Dry eye syndrome of bilateral lacrimal glands: Secondary | ICD-10-CM | POA: Diagnosis not present

## 2019-08-24 DIAGNOSIS — F331 Major depressive disorder, recurrent, moderate: Secondary | ICD-10-CM | POA: Diagnosis not present

## 2019-08-24 DIAGNOSIS — I10 Essential (primary) hypertension: Secondary | ICD-10-CM | POA: Diagnosis not present

## 2019-09-06 DIAGNOSIS — R609 Edema, unspecified: Secondary | ICD-10-CM | POA: Diagnosis not present

## 2019-09-06 DIAGNOSIS — I10 Essential (primary) hypertension: Secondary | ICD-10-CM | POA: Diagnosis not present

## 2019-09-06 DIAGNOSIS — H6123 Impacted cerumen, bilateral: Secondary | ICD-10-CM | POA: Diagnosis not present

## 2019-09-06 DIAGNOSIS — R7303 Prediabetes: Secondary | ICD-10-CM | POA: Diagnosis not present

## 2019-09-06 DIAGNOSIS — F331 Major depressive disorder, recurrent, moderate: Secondary | ICD-10-CM | POA: Diagnosis not present

## 2019-09-06 LAB — BASIC METABOLIC PANEL
BUN: 18 (ref 4–21)
CO2: 32 — AB (ref 13–22)
Chloride: 98 — AB (ref 99–108)
Creatinine: 0.8 (ref 0.5–1.1)
Glucose: 83
Potassium: 4.7 (ref 3.4–5.3)
Sodium: 138 (ref 137–147)

## 2019-09-06 LAB — COMPREHENSIVE METABOLIC PANEL
Calcium: 10.3 (ref 8.7–10.7)
GFR calc Af Amer: 86
GFR calc non Af Amer: 71

## 2019-09-06 LAB — HEMOGLOBIN A1C: Hemoglobin A1C: 5.9

## 2019-09-25 ENCOUNTER — Encounter (INDEPENDENT_AMBULATORY_CARE_PROVIDER_SITE_OTHER): Payer: Self-pay | Admitting: Family Medicine

## 2019-09-25 ENCOUNTER — Ambulatory Visit (INDEPENDENT_AMBULATORY_CARE_PROVIDER_SITE_OTHER): Payer: PPO | Admitting: Family Medicine

## 2019-09-25 ENCOUNTER — Other Ambulatory Visit: Payer: Self-pay

## 2019-09-25 VITALS — BP 125/82 | HR 77 | Temp 98.0°F | Ht 65.0 in | Wt 298.0 lb

## 2019-09-25 DIAGNOSIS — F3289 Other specified depressive episodes: Secondary | ICD-10-CM | POA: Diagnosis not present

## 2019-09-25 DIAGNOSIS — I10 Essential (primary) hypertension: Secondary | ICD-10-CM | POA: Diagnosis not present

## 2019-09-25 DIAGNOSIS — R5383 Other fatigue: Secondary | ICD-10-CM | POA: Diagnosis not present

## 2019-09-25 DIAGNOSIS — Z87891 Personal history of nicotine dependence: Secondary | ICD-10-CM | POA: Diagnosis not present

## 2019-09-25 DIAGNOSIS — Z6841 Body Mass Index (BMI) 40.0 and over, adult: Secondary | ICD-10-CM | POA: Diagnosis not present

## 2019-09-25 DIAGNOSIS — E041 Nontoxic single thyroid nodule: Secondary | ICD-10-CM | POA: Diagnosis not present

## 2019-09-25 DIAGNOSIS — E559 Vitamin D deficiency, unspecified: Secondary | ICD-10-CM

## 2019-09-25 DIAGNOSIS — Z0289 Encounter for other administrative examinations: Secondary | ICD-10-CM

## 2019-09-25 DIAGNOSIS — R7303 Prediabetes: Secondary | ICD-10-CM | POA: Diagnosis not present

## 2019-09-25 DIAGNOSIS — R0602 Shortness of breath: Secondary | ICD-10-CM

## 2019-09-25 DIAGNOSIS — F39 Unspecified mood [affective] disorder: Secondary | ICD-10-CM | POA: Diagnosis not present

## 2019-09-25 DIAGNOSIS — F331 Major depressive disorder, recurrent, moderate: Secondary | ICD-10-CM | POA: Diagnosis not present

## 2019-09-25 DIAGNOSIS — F1010 Alcohol abuse, uncomplicated: Secondary | ICD-10-CM

## 2019-09-25 DIAGNOSIS — E538 Deficiency of other specified B group vitamins: Secondary | ICD-10-CM | POA: Diagnosis not present

## 2019-09-25 NOTE — Progress Notes (Signed)
Dear Dr. Dema Mccoy,   Thank you for referring Tammy Mccoy to our clinic. The following note includes my evaluation and treatment recommendations.  Chief Complaint:   OBESITY Tammy Mccoy (MR# [id number]) is a 69 y.o. female who presents for evaluation and treatment of obesity and related comorbidities. Current BMI is Body mass index is 49.59 kg/m. Tammy Mccoy has been struggling with her weight for many years and has been unsuccessful in either losing weight, maintaining weight loss, or reaching her healthy weight goal.  Tammy Mccoy is currently in the action stage of change and ready to dedicate time achieving and maintaining a healthier weight. Tammy Mccoy is interested in becoming our patient and working on intensive lifestyle modifications including (but not limited to) diet and exercise for weight loss.  Tammy Mccoy is widowed and lives with her 20 year old son.  She is retired.  She eats out 10-15 times per week.  She says she craves salty and sweet foods.  She likes to snack on ice cream, cookies, and peanut butter crackers.  She does not skip meals.  She says she eats a lot of fried foods and fast food.  She reports that Weight Watchers with accountability has worked the best in the past.  Tammy Mccoy's habits were reviewed today and are as follows: her desired weight loss is 100 pounds, she has been heavy most of her life, she started gaining weight in her teens, her heaviest weight ever was 310 pounds, she craves salty and sweet foods, she snacks frequently in the evenings, she wakes up frequently in the middle of the night to eat, she frequently makes poor food choices, she frequently eats larger portions than normal and she struggles with emotional eating.  Depression Screen Tammy Mccoy's Food and Mood (modified PHQ-9) score was 21.  Depression screen Tammy Mccoy 2/9 09/25/2019  Decreased Interest 3  Down, Depressed, Hopeless 3  PHQ - 2 Score 6  Altered sleeping 3  Tired, decreased energy 3    Change in appetite 2  Feeling bad or failure about yourself  3  Trouble concentrating 1  Moving slowly or fidgety/restless 3  Suicidal thoughts 0  PHQ-9 Score 21  Difficult doing work/chores Extremely dIfficult   Subjective:   1. Other fatigue Tammy Mccoy admits to daytime somnolence and reports waking up still tired. Patent has a history of symptoms of daytime fatigue and morning fatigue. Tammy Mccoy generally gets 6 or 7 hours of sleep per night, and states that she has poor quality sleep. Snoring might be present. Apneic episodes are not present. Epworth Sleepiness Score is 13.  2. SOB (shortness of breath) on exertion Tammy Mccoy notes increasing shortness of breath with exercising and seems to be worsening over time with weight gain. She notes getting out of breath sooner with activity than she used to. This has gotten worse recently. Tammy Mccoy denies shortness of breath at rest or orthopnea.  3. ETOH abuse Tammy Mccoy says she has not had any ETOH for 15+ years now.  4. B12 deficiency She notes fatigue. She is not a vegetarian.  She does not have a previous diagnosis of pernicious anemia.  She does not have a history of weight loss surgery.  Tammy Mccoy is taking vitain B12 3000 mcg daily OTC.   5. Vitamin D deficiency She is currently taking OTC vitamin D 2000 IU each day. She denies nausea, vomiting or muscle weakness.  6. History of smoking 30 or more pack years Tammy Mccoy smoked 1-2 packs per day for 30+ years.  She says she quit  cold [place] in 2010.  7. Thyroid nodule She was seen by Dr. Buddy Mccoy of Endocrinology, who checked labs in January.  She gets yearly scans.  She has a family history of thyroid cancer in her sister that required radiation/several treatments, status post thyroidectomy.  8. Prediabetes Tammy Mccoy has a diagnosis of prediabetes based on her elevated HgA1c and was informed this puts her at greater risk of developing diabetes. She continues to work on diet and  exercise to decrease her risk of diabetes. She denies nausea or hypoglycemia.  Recently diagnosed with this by her PCP.  She admits she is in denial about it.  9. Other depression, with emotional eating Tammy Mccoy is struggling with emotional eating and using food for comfort to the extent that it is negatively impacting her health. She has been working on behavior modification techniques to help reduce her emotional eating and has been unsuccessful. She shows no sign of suicidal or homicidal ideations.  She is taking Prozac and Xanax.  Also with anxiety.  She is tearful at times today as she is fearful of failing at weight loss again.  Assessment/Plan:   1. Other fatigue Tammy Mccoy does feel that her weight is causing her energy to be lower than it should be. Fatigue may be related to obesity, depression or many other causes. Labs will be ordered, and in the meanwhile, Tammy Mccoy will focus on self care including making healthy food choices, increasing physical activity and focusing on stress reduction. - EKG 12-Lead  --> cma asked to obtain recent labs from PCP's office.   2. SOB (shortness of breath) on exertion Tammy Mccoy does feel that she gets out of breath more easily that she used to when she exercises. Tammy Mccoy's shortness of breath appears to be obesity related and exercise induced. She has agreed to work on weight loss and gradually increase exercise to treat her exercise induced shortness of breath. Will continue to monitor closely.  3. ETOH abuse Will continue to monitor.  4. B12 deficiency The diagnosis was reviewed with the patient. Counseling provided today, see below. We will continue to monitor. Orders and follow up as documented in patient record.  Counseling . The body needs vitamin B12: to make red blood cells; to make DNA; and to help the nerves work properly so they can carry messages from the brain to the body.  . The main causes of vitamin B12 deficiency include dietary  deficiency, digestive diseases, pernicious anemia, and having a surgery in which part of the stomach or small intestine is removed.  . Certain medicines can make it harder for the body to absorb vitamin B12. These medicines include: heartburn medications; some antibiotics; some medications used to treat diabetes, gout, and high cholesterol.  . In some cases, there are no symptoms of this condition. If the condition leads to anemia or nerve damage, various symptoms can occur, such as weakness or fatigue, shortness of breath, and numbness or tingling in your hands and feet.   . Treatment:  o May include taking vitamin B12 supplements.  o Avoid alcohol.  o Eat lots of healthy foods that contain vitamin B12: - Beef, pork, chicken, [place], and organ meats, such as liver.  - Seafood: This includes clams, rainbow trout, salmon, tuna, and haddock. Eggs.  - Cereal and dairy products that are fortified: This means that vitamin B12 has been added to the food.   5. Vitamin D deficiency Low Vitamin D level contributes to fatigue and are associated with obesity, breast, and  colon cancer. She agrees to continue to take OTC Vitamin D @2 ,000 IU daily and will follow-up for routine testing of Vitamin D, at least 2-3 times per year to avoid over-replacement.  6. History of smoking 30 or more pack years Very strongly urged to continue cessation to reduce cardiovascular risk.  7. Thyroid nodule Masaye will continue to follow with Endocrinology.  8. Prediabetes Shamiyah will continue to work on weight loss, exercise, and decreasing simple carbohydrates to help decrease the risk of diabetes.  We will get labs from her PCP and Endocrinology.  Continue prudent nutritional plan and weight loss.  9. Other depression, with emotional eating Patient was referred to Dr. Mallie Mussel, our Bariatric Psychologist, for evaluation due to her elevated PHQ-9 score and significant struggles with emotional eating.  Continue mood  medications per PCP.  Reminded her that her mood will improve with improved nutrition.   10. Class 3 severe obesity with serious comorbidity and body mass index (BMI) of 45.0 to 49.9 in adult, unspecified obesity type [facility]) Lennyn is currently in the action stage of change and her goal is to continue with weight loss efforts. I recommend Marlyce begin the structured treatment plan as follows:  She has agreed to the Category 2 Plan.  Exercise goals: As is.   Behavioral modification strategies: increasing lean protein intake, decreasing simple carbohydrates, no skipping meals, meal planning and cooking strategies and planning for success.  She was informed of the importance of frequent follow-up visits to maximize her success with intensive lifestyle modifications for her multiple health conditions. She was informed we would discuss her lab results at her next visit unless there is a critical issue that needs to be addressed sooner. Chena agreed to keep her next visit at the agreed upon time to discuss these results.  Objective:   Blood pressure 125/82, pulse 77, temperature 98 F (36.7 C), height 5\' 5"  (1.651 m), weight (!) 298 lb (135.2 kg), SpO2 96 %. Body mass index is 49.59 kg/m.  EKG: Normal sinus rhythm, rate 72 bpm.  Indirect Calorimeter completed today shows a VO2 of 275 and a REE of 1912.  Her calculated basal metabolic rate is 4854 thus her basal metabolic rate is better than expected.  General: Cooperative, alert, well developed, in no acute distress. HEENT: Conjunctivae and lids unremarkable. Cardiovascular: Regular rhythm.  Lungs: Normal work of breathing. Neurologic: No focal deficits.   Lab Results  Component Value Date   WBC 5.6 06/08/2016   HGB 13.6 06/08/2016   HCT 41.7 06/08/2016   MCV 90.5 06/08/2016   PLT 300 06/08/2016   Obesity Behavioral Intervention Visit Documentation for Insurance:   Approximately 15 minutes were spent on the discussion  below.  ASK: We discussed the diagnosis of obesity with Tammy Mccoy today and Veleta agreed to give [place] permission to discuss obesity behavioral modification therapy today.  ASSESS: Darrelyn has the diagnosis of obesity and her BMI today is 49.6. Randye is in the action stage of change.   ADVISE: Leontyne was educated on the multiple health risks of obesity as well as the benefit of weight loss to improve her health. She was advised of the need for long term treatment and the importance of lifestyle modifications to improve her current health and to decrease her risk of future health problems.  AGREE: Multiple dietary modification options and treatment options were discussed and Kimberlyn agreed to follow the recommendations documented in the above note.  ARRANGE: Aditi was educated on the importance of frequent  visits to treat obesity as outlined per CMS and USPSTF guidelines and agreed to schedule her next follow up appointment today.  Attestation Statements:   Reviewed by clinician on day of visit: allergies, medications, problem list, medical history, surgical history, family history, social history, and previous encounter notes.  I, Water quality scientist, CMA, am acting as Location manager for [employer], DO.  I have reviewed the above documentation for accuracy and completeness, and I agree with the above. Mellody Dance, DO

## 2019-09-27 ENCOUNTER — Encounter (INDEPENDENT_AMBULATORY_CARE_PROVIDER_SITE_OTHER): Payer: Self-pay

## 2019-10-09 ENCOUNTER — Ambulatory Visit (INDEPENDENT_AMBULATORY_CARE_PROVIDER_SITE_OTHER): Payer: BLUE CROSS/BLUE SHIELD | Admitting: Family Medicine

## 2019-10-12 DIAGNOSIS — L821 Other seborrheic keratosis: Secondary | ICD-10-CM | POA: Diagnosis not present

## 2019-10-12 DIAGNOSIS — L820 Inflamed seborrheic keratosis: Secondary | ICD-10-CM | POA: Diagnosis not present

## 2019-10-12 DIAGNOSIS — Z85828 Personal history of other malignant neoplasm of skin: Secondary | ICD-10-CM | POA: Diagnosis not present

## 2019-10-24 DIAGNOSIS — M5416 Radiculopathy, lumbar region: Secondary | ICD-10-CM | POA: Diagnosis not present

## 2019-10-24 DIAGNOSIS — M5136 Other intervertebral disc degeneration, lumbar region: Secondary | ICD-10-CM | POA: Diagnosis not present

## 2019-10-24 DIAGNOSIS — I700 Atherosclerosis of aorta: Secondary | ICD-10-CM | POA: Diagnosis not present

## 2019-10-29 DIAGNOSIS — I10 Essential (primary) hypertension: Secondary | ICD-10-CM | POA: Diagnosis not present

## 2019-10-29 DIAGNOSIS — F331 Major depressive disorder, recurrent, moderate: Secondary | ICD-10-CM | POA: Diagnosis not present

## 2019-10-30 DIAGNOSIS — M545 Low back pain: Secondary | ICD-10-CM | POA: Diagnosis not present

## 2019-10-30 DIAGNOSIS — R293 Abnormal posture: Secondary | ICD-10-CM | POA: Diagnosis not present

## 2019-10-30 DIAGNOSIS — R262 Difficulty in walking, not elsewhere classified: Secondary | ICD-10-CM | POA: Diagnosis not present

## 2019-10-30 DIAGNOSIS — M2569 Stiffness of other specified joint, not elsewhere classified: Secondary | ICD-10-CM | POA: Diagnosis not present

## 2019-11-01 DIAGNOSIS — R262 Difficulty in walking, not elsewhere classified: Secondary | ICD-10-CM | POA: Diagnosis not present

## 2019-11-01 DIAGNOSIS — M545 Low back pain: Secondary | ICD-10-CM | POA: Diagnosis not present

## 2019-11-01 DIAGNOSIS — R293 Abnormal posture: Secondary | ICD-10-CM | POA: Diagnosis not present

## 2019-11-01 DIAGNOSIS — M2569 Stiffness of other specified joint, not elsewhere classified: Secondary | ICD-10-CM | POA: Diagnosis not present

## 2019-11-13 DIAGNOSIS — M2569 Stiffness of other specified joint, not elsewhere classified: Secondary | ICD-10-CM | POA: Diagnosis not present

## 2019-11-13 DIAGNOSIS — R262 Difficulty in walking, not elsewhere classified: Secondary | ICD-10-CM | POA: Diagnosis not present

## 2019-11-13 DIAGNOSIS — M545 Low back pain: Secondary | ICD-10-CM | POA: Diagnosis not present

## 2019-11-13 DIAGNOSIS — R293 Abnormal posture: Secondary | ICD-10-CM | POA: Diagnosis not present

## 2019-11-15 DIAGNOSIS — M2569 Stiffness of other specified joint, not elsewhere classified: Secondary | ICD-10-CM | POA: Diagnosis not present

## 2019-11-15 DIAGNOSIS — M545 Low back pain: Secondary | ICD-10-CM | POA: Diagnosis not present

## 2019-11-15 DIAGNOSIS — R293 Abnormal posture: Secondary | ICD-10-CM | POA: Diagnosis not present

## 2019-11-15 DIAGNOSIS — R262 Difficulty in walking, not elsewhere classified: Secondary | ICD-10-CM | POA: Diagnosis not present

## 2019-12-12 DIAGNOSIS — F331 Major depressive disorder, recurrent, moderate: Secondary | ICD-10-CM | POA: Diagnosis not present

## 2019-12-12 DIAGNOSIS — I700 Atherosclerosis of aorta: Secondary | ICD-10-CM | POA: Diagnosis not present

## 2019-12-12 DIAGNOSIS — R609 Edema, unspecified: Secondary | ICD-10-CM | POA: Diagnosis not present

## 2019-12-12 DIAGNOSIS — R7303 Prediabetes: Secondary | ICD-10-CM | POA: Diagnosis not present

## 2019-12-12 DIAGNOSIS — Z23 Encounter for immunization: Secondary | ICD-10-CM | POA: Diagnosis not present

## 2019-12-19 DIAGNOSIS — M1711 Unilateral primary osteoarthritis, right knee: Secondary | ICD-10-CM | POA: Diagnosis not present

## 2019-12-26 DIAGNOSIS — M1711 Unilateral primary osteoarthritis, right knee: Secondary | ICD-10-CM | POA: Diagnosis not present

## 2020-01-02 DIAGNOSIS — F331 Major depressive disorder, recurrent, moderate: Secondary | ICD-10-CM | POA: Diagnosis not present

## 2020-01-02 DIAGNOSIS — M1711 Unilateral primary osteoarthritis, right knee: Secondary | ICD-10-CM | POA: Diagnosis not present

## 2020-01-02 DIAGNOSIS — G4700 Insomnia, unspecified: Secondary | ICD-10-CM | POA: Diagnosis not present

## 2020-01-14 ENCOUNTER — Other Ambulatory Visit: Payer: Self-pay | Admitting: Physical Medicine and Rehabilitation

## 2020-01-14 DIAGNOSIS — M5459 Other low back pain: Secondary | ICD-10-CM

## 2020-01-16 DIAGNOSIS — H40033 Anatomical narrow angle, bilateral: Secondary | ICD-10-CM | POA: Diagnosis not present

## 2020-01-16 DIAGNOSIS — H16223 Keratoconjunctivitis sicca, not specified as Sjogren's, bilateral: Secondary | ICD-10-CM | POA: Diagnosis not present

## 2020-01-16 DIAGNOSIS — H04123 Dry eye syndrome of bilateral lacrimal glands: Secondary | ICD-10-CM | POA: Diagnosis not present

## 2020-01-16 DIAGNOSIS — H2513 Age-related nuclear cataract, bilateral: Secondary | ICD-10-CM | POA: Diagnosis not present

## 2020-01-16 DIAGNOSIS — H40013 Open angle with borderline findings, low risk, bilateral: Secondary | ICD-10-CM | POA: Diagnosis not present

## 2020-01-18 ENCOUNTER — Telehealth: Payer: Self-pay | Admitting: Acute Care

## 2020-01-18 DIAGNOSIS — Z87891 Personal history of nicotine dependence: Secondary | ICD-10-CM

## 2020-01-18 NOTE — Telephone Encounter (Signed)
Tammy Mccoy, can you contact pt to schedule?

## 2020-01-18 NOTE — Telephone Encounter (Signed)
LVM for patient to call me back to schedule her LCS CT [phone]

## 2020-01-18 NOTE — Telephone Encounter (Signed)
New  CT order placed

## 2020-01-18 NOTE — Telephone Encounter (Signed)
Langley Gauss the patient was due 10/2019 and I will need new LCS CT order to schedule her CT

## 2020-01-22 NOTE — Telephone Encounter (Signed)
I have now finally got to speak with Tammy Mccoy she is have a spine CT on 02/08/20 and wanted to get the LCS CT done at the same time but [facility] didn't have any times available that day.  So Tammy Mccoy's LCS CT has been scheduled on 02/13/20 @ 11:00am and she is aware

## 2020-01-22 NOTE — Telephone Encounter (Signed)
LVM again for the patient to call me back to schedule her LCS CT  [phone]

## 2020-01-23 DIAGNOSIS — I700 Atherosclerosis of aorta: Secondary | ICD-10-CM | POA: Diagnosis not present

## 2020-01-23 DIAGNOSIS — R7303 Prediabetes: Secondary | ICD-10-CM | POA: Diagnosis not present

## 2020-02-06 DIAGNOSIS — Z85828 Personal history of other malignant neoplasm of skin: Secondary | ICD-10-CM | POA: Diagnosis not present

## 2020-02-06 DIAGNOSIS — L814 Other melanin hyperpigmentation: Secondary | ICD-10-CM | POA: Diagnosis not present

## 2020-02-06 DIAGNOSIS — L732 Hidradenitis suppurativa: Secondary | ICD-10-CM | POA: Diagnosis not present

## 2020-02-06 DIAGNOSIS — D692 Other nonthrombocytopenic purpura: Secondary | ICD-10-CM | POA: Diagnosis not present

## 2020-02-06 DIAGNOSIS — L718 Other rosacea: Secondary | ICD-10-CM | POA: Diagnosis not present

## 2020-02-06 DIAGNOSIS — L821 Other seborrheic keratosis: Secondary | ICD-10-CM | POA: Diagnosis not present

## 2020-02-08 ENCOUNTER — Ambulatory Visit
Admission: RE | Admit: 2020-02-08 | Discharge: 2020-02-08 | Disposition: A | Discharge status: Home / Self Care | Payer: PPO | Source: Ambulatory Visit | Attending: Physical Medicine and Rehabilitation | Admitting: Physical Medicine and Rehabilitation

## 2020-02-08 DIAGNOSIS — M545 Low back pain, unspecified: Secondary | ICD-10-CM | POA: Diagnosis not present

## 2020-02-08 DIAGNOSIS — M5459 Other low back pain: Secondary | ICD-10-CM

## 2020-02-13 ENCOUNTER — Ambulatory Visit
Admission: RE | Admit: 2020-02-13 | Discharge: 2020-02-13 | Disposition: A | Discharge status: Home / Self Care | Payer: PPO | Source: Ambulatory Visit | Attending: Acute Care | Admitting: Acute Care

## 2020-02-13 DIAGNOSIS — I2510 Atherosclerotic heart disease of native coronary artery without angina pectoris: Secondary | ICD-10-CM | POA: Diagnosis not present

## 2020-02-13 DIAGNOSIS — I700 Atherosclerosis of aorta: Secondary | ICD-10-CM | POA: Diagnosis not present

## 2020-02-13 DIAGNOSIS — Z87891 Personal history of nicotine dependence: Secondary | ICD-10-CM | POA: Diagnosis not present

## 2020-02-13 DIAGNOSIS — J439 Emphysema, unspecified: Secondary | ICD-10-CM | POA: Diagnosis not present

## 2020-02-28 NOTE — Progress Notes (Signed)

## 2020-03-04 ENCOUNTER — Other Ambulatory Visit: Payer: Self-pay | Admitting: *Deleted

## 2020-03-04 DIAGNOSIS — Z87891 Personal history of nicotine dependence: Secondary | ICD-10-CM

## 2020-03-04 DIAGNOSIS — M5416 Radiculopathy, lumbar region: Secondary | ICD-10-CM | POA: Diagnosis not present

## 2020-03-25 DIAGNOSIS — M5416 Radiculopathy, lumbar region: Secondary | ICD-10-CM | POA: Diagnosis not present

## 2020-04-08 DIAGNOSIS — M5416 Radiculopathy, lumbar region: Secondary | ICD-10-CM | POA: Diagnosis not present

## 2020-04-18 DIAGNOSIS — Z803 Family history of malignant neoplasm of breast: Secondary | ICD-10-CM | POA: Diagnosis not present

## 2020-04-18 DIAGNOSIS — Z1231 Encounter for screening mammogram for malignant neoplasm of breast: Secondary | ICD-10-CM | POA: Diagnosis not present

## 2020-04-21 DIAGNOSIS — M5416 Radiculopathy, lumbar region: Secondary | ICD-10-CM | POA: Diagnosis not present

## 2020-04-28 DIAGNOSIS — J449 Chronic obstructive pulmonary disease, unspecified: Secondary | ICD-10-CM | POA: Diagnosis not present

## 2020-04-28 DIAGNOSIS — I700 Atherosclerosis of aorta: Secondary | ICD-10-CM | POA: Diagnosis not present

## 2020-04-28 DIAGNOSIS — R7303 Prediabetes: Secondary | ICD-10-CM | POA: Diagnosis not present

## 2020-04-28 DIAGNOSIS — M5136 Other intervertebral disc degeneration, lumbar region: Secondary | ICD-10-CM | POA: Diagnosis not present

## 2020-04-28 DIAGNOSIS — G4730 Sleep apnea, unspecified: Secondary | ICD-10-CM | POA: Diagnosis not present

## 2020-04-28 DIAGNOSIS — F331 Major depressive disorder, recurrent, moderate: Secondary | ICD-10-CM | POA: Diagnosis not present

## 2020-04-30 DIAGNOSIS — Z808 Family history of malignant neoplasm of other organs or systems: Secondary | ICD-10-CM | POA: Diagnosis not present

## 2020-04-30 DIAGNOSIS — Z8349 Family history of other endocrine, nutritional and metabolic diseases: Secondary | ICD-10-CM | POA: Diagnosis not present

## 2020-04-30 DIAGNOSIS — R922 Inconclusive mammogram: Secondary | ICD-10-CM | POA: Diagnosis not present

## 2020-04-30 DIAGNOSIS — R928 Other abnormal and inconclusive findings on diagnostic imaging of breast: Secondary | ICD-10-CM | POA: Diagnosis not present

## 2020-04-30 DIAGNOSIS — Z8639 Personal history of other endocrine, nutritional and metabolic disease: Secondary | ICD-10-CM | POA: Diagnosis not present

## 2020-04-30 DIAGNOSIS — R7303 Prediabetes: Secondary | ICD-10-CM | POA: Diagnosis not present

## 2020-04-30 DIAGNOSIS — E042 Nontoxic multinodular goiter: Secondary | ICD-10-CM | POA: Diagnosis not present

## 2020-05-06 DIAGNOSIS — M5416 Radiculopathy, lumbar region: Secondary | ICD-10-CM | POA: Diagnosis not present

## 2020-05-22 DIAGNOSIS — G4733 Obstructive sleep apnea (adult) (pediatric): Secondary | ICD-10-CM | POA: Diagnosis not present

## 2020-05-22 DIAGNOSIS — R7303 Prediabetes: Secondary | ICD-10-CM | POA: Diagnosis not present

## 2020-05-22 DIAGNOSIS — J449 Chronic obstructive pulmonary disease, unspecified: Secondary | ICD-10-CM | POA: Diagnosis not present

## 2020-06-24 DIAGNOSIS — M5416 Radiculopathy, lumbar region: Secondary | ICD-10-CM | POA: Diagnosis not present

## 2020-06-27 DIAGNOSIS — M65342 Trigger finger, left ring finger: Secondary | ICD-10-CM | POA: Diagnosis not present

## 2020-06-28 DIAGNOSIS — Z03818 Encounter for observation for suspected exposure to other biological agents ruled out: Secondary | ICD-10-CM | POA: Diagnosis not present

## 2020-06-28 DIAGNOSIS — J069 Acute upper respiratory infection, unspecified: Secondary | ICD-10-CM | POA: Diagnosis not present

## 2020-06-29 DIAGNOSIS — G4733 Obstructive sleep apnea (adult) (pediatric): Secondary | ICD-10-CM | POA: Diagnosis not present

## 2020-07-07 DIAGNOSIS — F331 Major depressive disorder, recurrent, moderate: Secondary | ICD-10-CM | POA: Diagnosis not present

## 2020-07-07 DIAGNOSIS — G4730 Sleep apnea, unspecified: Secondary | ICD-10-CM | POA: Diagnosis not present

## 2020-07-07 DIAGNOSIS — M5136 Other intervertebral disc degeneration, lumbar region: Secondary | ICD-10-CM | POA: Diagnosis not present

## 2020-07-07 DIAGNOSIS — J449 Chronic obstructive pulmonary disease, unspecified: Secondary | ICD-10-CM | POA: Diagnosis not present

## 2020-07-14 DIAGNOSIS — H40033 Anatomical narrow angle, bilateral: Secondary | ICD-10-CM | POA: Diagnosis not present

## 2020-07-14 DIAGNOSIS — H25811 Combined forms of age-related cataract, right eye: Secondary | ICD-10-CM | POA: Diagnosis not present

## 2020-07-14 DIAGNOSIS — H43822 Vitreomacular adhesion, left eye: Secondary | ICD-10-CM | POA: Diagnosis not present

## 2020-07-14 DIAGNOSIS — H16223 Keratoconjunctivitis sicca, not specified as Sjogren's, bilateral: Secondary | ICD-10-CM | POA: Diagnosis not present

## 2020-07-14 DIAGNOSIS — H04123 Dry eye syndrome of bilateral lacrimal glands: Secondary | ICD-10-CM | POA: Diagnosis not present

## 2020-07-14 DIAGNOSIS — H43392 Other vitreous opacities, left eye: Secondary | ICD-10-CM | POA: Diagnosis not present

## 2020-07-14 DIAGNOSIS — H43811 Vitreous degeneration, right eye: Secondary | ICD-10-CM | POA: Diagnosis not present

## 2020-07-30 DIAGNOSIS — G4733 Obstructive sleep apnea (adult) (pediatric): Secondary | ICD-10-CM | POA: Diagnosis not present

## 2020-08-06 DIAGNOSIS — J449 Chronic obstructive pulmonary disease, unspecified: Secondary | ICD-10-CM | POA: Diagnosis not present

## 2020-08-06 DIAGNOSIS — G4700 Insomnia, unspecified: Secondary | ICD-10-CM | POA: Diagnosis not present

## 2020-08-06 DIAGNOSIS — F331 Major depressive disorder, recurrent, moderate: Secondary | ICD-10-CM | POA: Diagnosis not present

## 2020-08-29 DIAGNOSIS — G4733 Obstructive sleep apnea (adult) (pediatric): Secondary | ICD-10-CM | POA: Diagnosis not present

## 2020-10-20 DIAGNOSIS — Z8349 Family history of other endocrine, nutritional and metabolic diseases: Secondary | ICD-10-CM | POA: Diagnosis not present

## 2020-10-20 DIAGNOSIS — E042 Nontoxic multinodular goiter: Secondary | ICD-10-CM | POA: Diagnosis not present

## 2020-10-20 DIAGNOSIS — Z8639 Personal history of other endocrine, nutritional and metabolic disease: Secondary | ICD-10-CM | POA: Diagnosis not present

## 2020-10-20 DIAGNOSIS — R7309 Other abnormal glucose: Secondary | ICD-10-CM | POA: Diagnosis not present

## 2020-10-20 DIAGNOSIS — Z808 Family history of malignant neoplasm of other organs or systems: Secondary | ICD-10-CM | POA: Diagnosis not present

## 2020-10-20 DIAGNOSIS — R7303 Prediabetes: Secondary | ICD-10-CM | POA: Diagnosis not present

## 2020-11-06 DIAGNOSIS — F331 Major depressive disorder, recurrent, moderate: Secondary | ICD-10-CM | POA: Diagnosis not present

## 2020-11-06 DIAGNOSIS — J449 Chronic obstructive pulmonary disease, unspecified: Secondary | ICD-10-CM | POA: Diagnosis not present

## 2020-11-06 DIAGNOSIS — I700 Atherosclerosis of aorta: Secondary | ICD-10-CM | POA: Diagnosis not present

## 2020-11-06 DIAGNOSIS — M5136 Other intervertebral disc degeneration, lumbar region: Secondary | ICD-10-CM | POA: Diagnosis not present

## 2020-11-06 DIAGNOSIS — I10 Essential (primary) hypertension: Secondary | ICD-10-CM | POA: Diagnosis not present

## 2020-11-10 DIAGNOSIS — U071 COVID-19: Secondary | ICD-10-CM | POA: Diagnosis not present

## 2020-12-18 ENCOUNTER — Other Ambulatory Visit: Payer: Self-pay | Admitting: Family Medicine

## 2020-12-18 DIAGNOSIS — I10 Essential (primary) hypertension: Secondary | ICD-10-CM | POA: Diagnosis not present

## 2020-12-18 DIAGNOSIS — R1010 Upper abdominal pain, unspecified: Secondary | ICD-10-CM | POA: Diagnosis not present

## 2020-12-18 DIAGNOSIS — I700 Atherosclerosis of aorta: Secondary | ICD-10-CM | POA: Diagnosis not present

## 2020-12-18 DIAGNOSIS — F331 Major depressive disorder, recurrent, moderate: Secondary | ICD-10-CM | POA: Diagnosis not present

## 2020-12-19 ENCOUNTER — Other Ambulatory Visit: Payer: Self-pay | Admitting: Family Medicine

## 2020-12-19 DIAGNOSIS — R1010 Upper abdominal pain, unspecified: Secondary | ICD-10-CM

## 2020-12-22 ENCOUNTER — Ambulatory Visit
Admission: RE | Admit: 2020-12-22 | Discharge: 2020-12-22 | Disposition: A | Discharge status: Home / Self Care | Payer: PPO | Source: Ambulatory Visit | Attending: Family Medicine | Admitting: Family Medicine

## 2020-12-22 ENCOUNTER — Other Ambulatory Visit: Payer: Self-pay

## 2020-12-22 DIAGNOSIS — K8020 Calculus of gallbladder without cholecystitis without obstruction: Secondary | ICD-10-CM | POA: Diagnosis not present

## 2020-12-22 DIAGNOSIS — R1010 Upper abdominal pain, unspecified: Secondary | ICD-10-CM

## 2021-01-09 DIAGNOSIS — K5792 Diverticulitis of intestine, part unspecified, without perforation or abscess without bleeding: Secondary | ICD-10-CM | POA: Diagnosis not present

## 2021-01-14 DIAGNOSIS — H43822 Vitreomacular adhesion, left eye: Secondary | ICD-10-CM | POA: Diagnosis not present

## 2021-01-14 DIAGNOSIS — H04123 Dry eye syndrome of bilateral lacrimal glands: Secondary | ICD-10-CM | POA: Diagnosis not present

## 2021-01-14 DIAGNOSIS — H40013 Open angle with borderline findings, low risk, bilateral: Secondary | ICD-10-CM | POA: Diagnosis not present

## 2021-01-14 DIAGNOSIS — H524 Presbyopia: Secondary | ICD-10-CM | POA: Diagnosis not present

## 2021-01-14 DIAGNOSIS — H25811 Combined forms of age-related cataract, right eye: Secondary | ICD-10-CM | POA: Diagnosis not present

## 2021-02-01 IMAGING — CT CT L SPINE W/O CM
3 of 4 series · 12 of 33 positions shown, 14 images · non-contrast
Comparison: None.

CLINICAL DATA: Low back pain

EXAM:
CT LUMBAR SPINE WITHOUT CONTRAST
TECHNIQUE: Multidetector CT imaging of the lumbar spine was performed without
intravenous contrast administration. Multiplanar CT image
reconstructions were also generated.

[Series 3: l-spine 2.00 br40 s3 lspine st · axial · 0.33mm/px · z∈[+1344,+1514]mm · 4 of 129 slices shown, 5 images]
[im 22/129  soft-tissue]
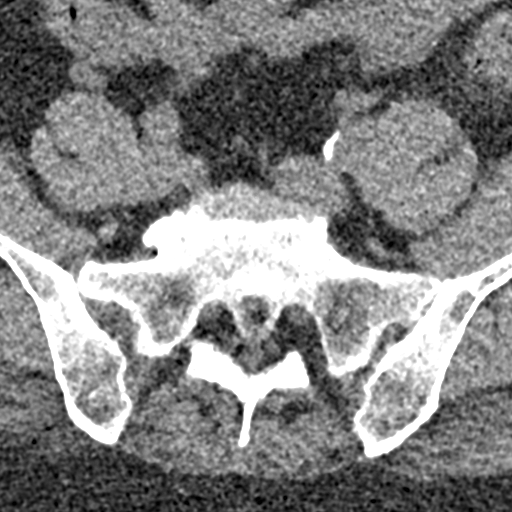
[im 22/129  bone]
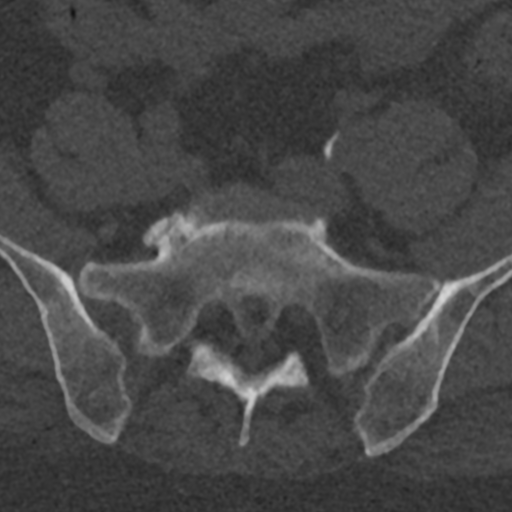
[im 43/129  bone]
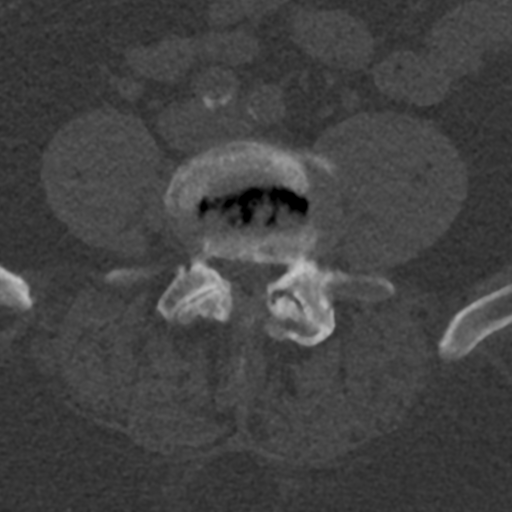
[im 86/129  bone]
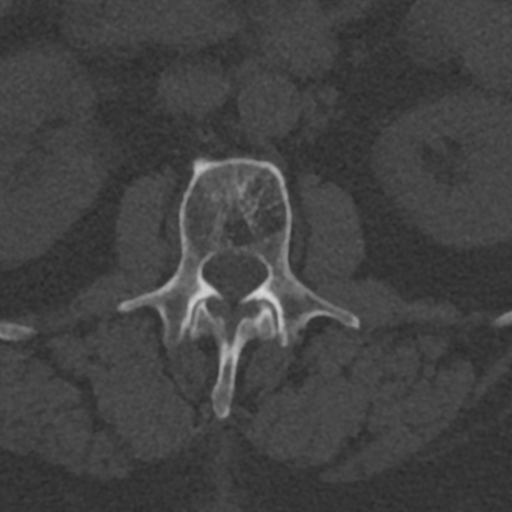
[im 107/129  bone]
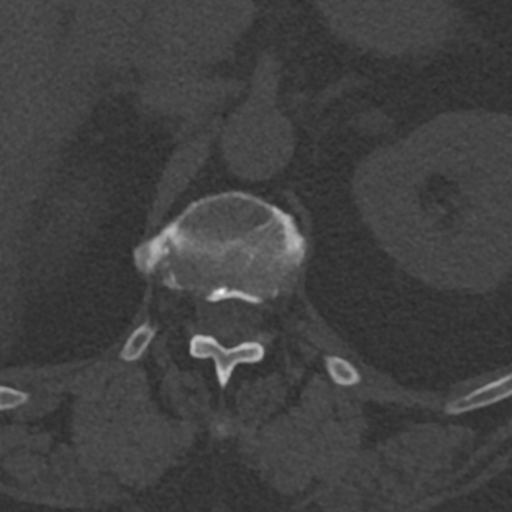

[Series 5: l-spine 2.00 br60 s3 sag bone · sagittal · 0.33mm/px · 5 of 84 slices shown, 6 images]
[im 28/84  bone]
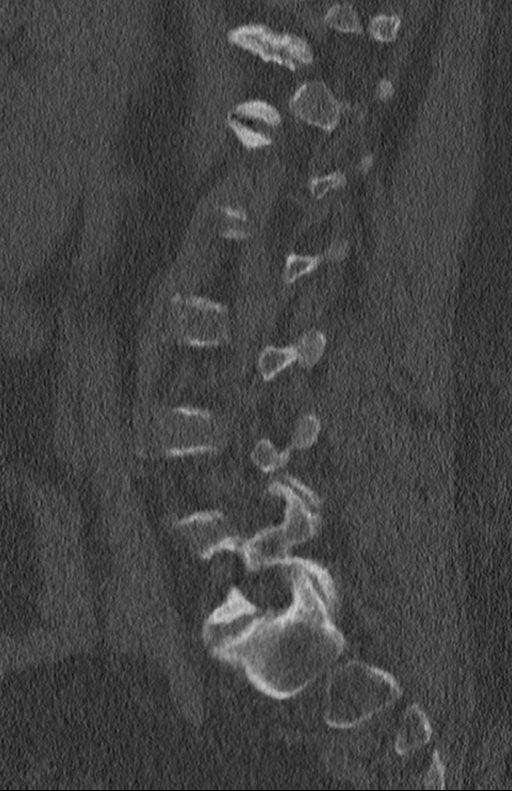
[im 35/84  bone]
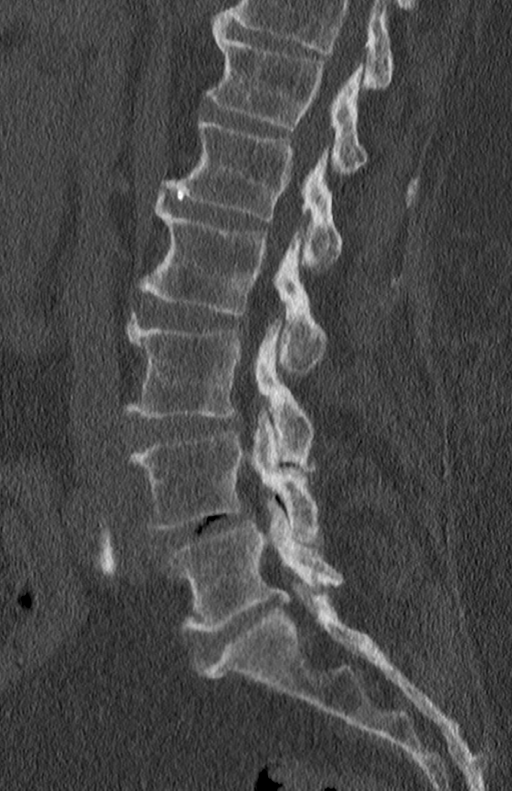
[im 42/84  soft-tissue]
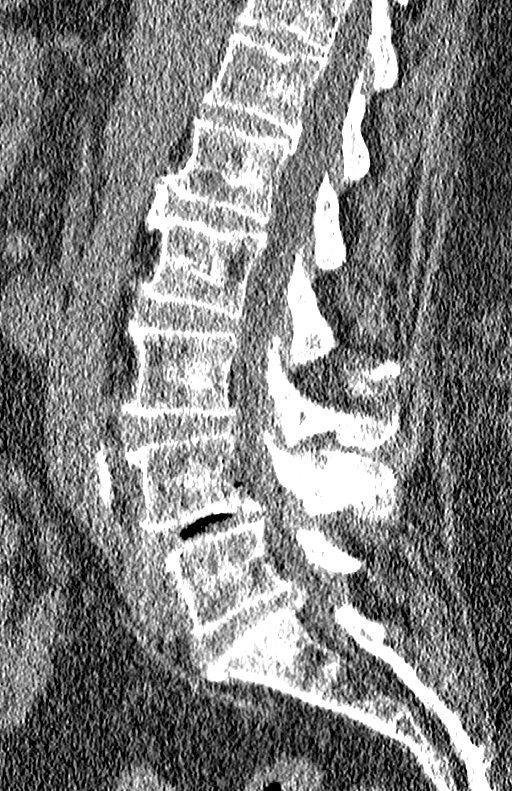
[im 42/84  bone]
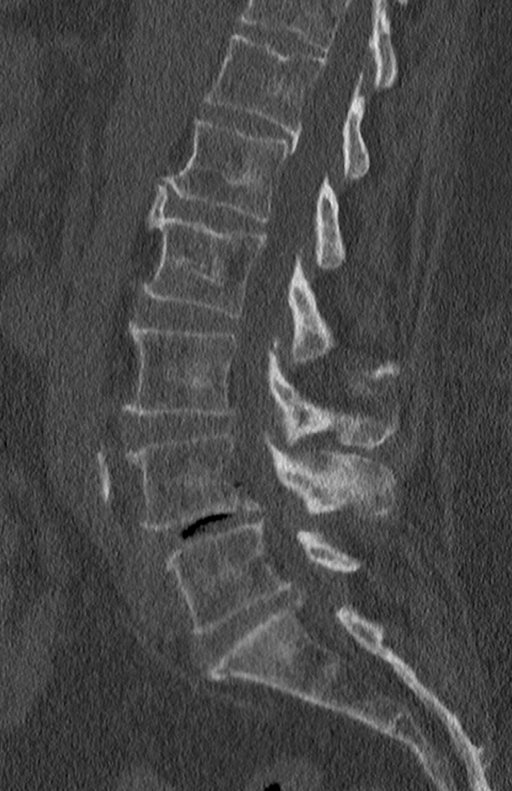
[im 49/84  bone]
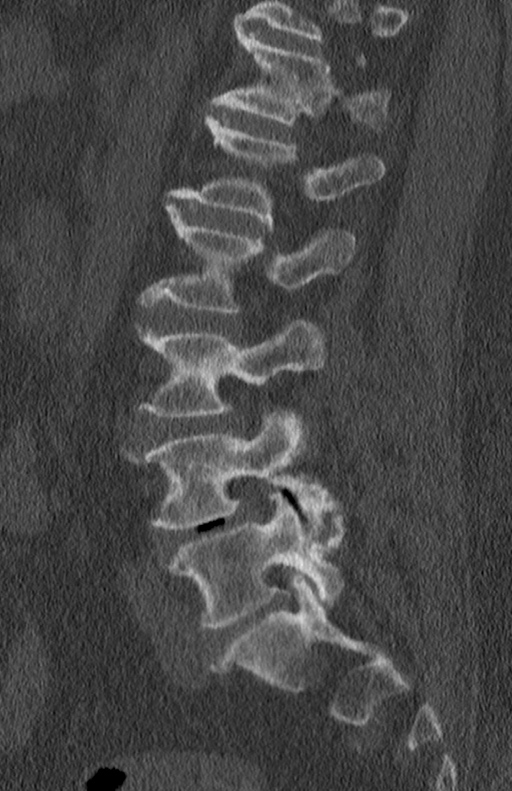
[im 56/84  bone]
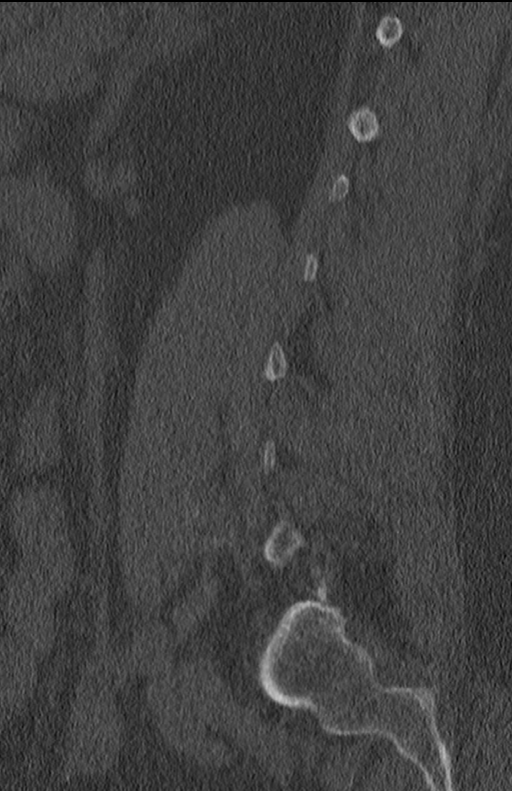

[Series 7: l-spine 2.00 br60 s3 cor bone · coronal · 0.33mm/px · 3 of 84 slices shown]
[im 17/84  bone]
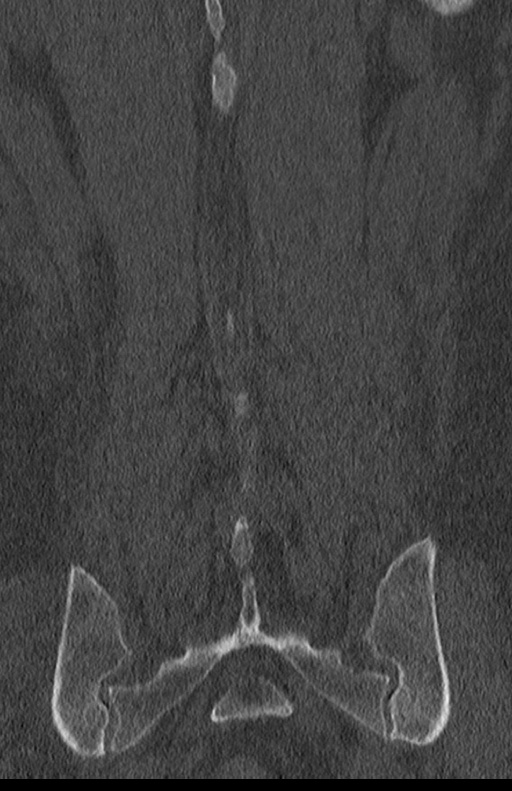
[im 34/84  bone]
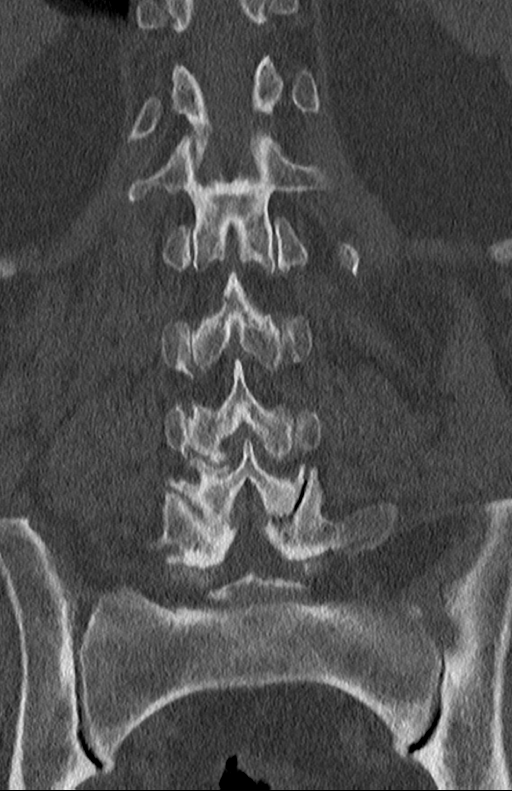
[im 50/84  bone]
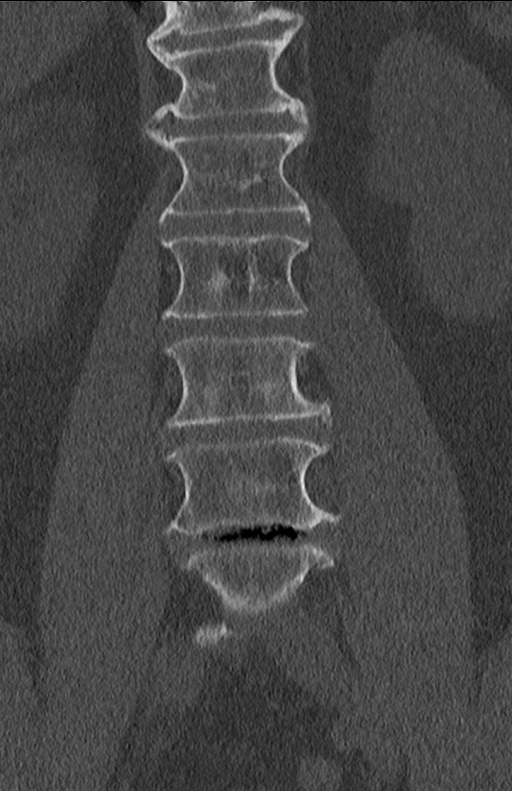

[12 of 33 positions shown; findings below may reference images not displayed]

FINDINGS: Segmentation: 5 lumbar type vertebrae based on the available
coverage

Alignment: Grade 1 anterolisthesis at L4-5

Vertebrae: No acute fracture or focal pathologic process.

Paraspinal and other soft tissues: Aortic atherosclerosis and left
colonic diverticulosis.

Disc levels:

T12- L1: Endplate spurring without visible impingement

L1-L2: Disc narrowing with ventral endplate spurring. No visible
impingement

L2-L3: Ventral endplate spurring.  No visible impingement

L3-L4: Disc narrowing and bulging with endplate and facet spurring.
Moderate spinal stenosis. Patent appearance of the foramina

L4-L5: Facet osteoarthritis with spurring and mild anterolisthesis.
The disc is narrowed and bulging with endplate ridging. Advanced
spinal stenosis. Moderate left foraminal narrowing

L5-S1:Disc narrowing and bulging with buttressing endplate
osteophyte. Mild facet spurring. Moderate spinal stenosis which
mainly affects the subarticular recesses.
IMPRESSION: 1. Multilevel disc and facet degeneration with L4-5 anterolisthesis.
2. L3-4 to L5-S1 spinal stenosis that is advanced at L4-5.

## 2021-02-05 ENCOUNTER — Ambulatory Visit: Payer: Self-pay | Admitting: Surgery

## 2021-02-05 DIAGNOSIS — K8050 Calculus of bile duct without cholangitis or cholecystitis without obstruction: Secondary | ICD-10-CM | POA: Diagnosis not present

## 2021-02-05 NOTE — H&P (Signed)
Tammy Mccoy [id number]   Referring Provider:  Harlan Stains, MD   Subjective   Chief Complaint: New Patient and Cholelithiasis     History of Present Illness: 70 year old woman with history of morbid obesity hypertension, hyperlipidemia, COPD referred for evaluation of gallstones.  She has been experiencing abdominal pain in the right upper quadrant.  This is exacerbated by eating fried foods.  Lasts about 30 minutes, and she has had about 4-5 episodes.  The most recent episode was this past weekend and lasted for a couple of hours, and was very focally in the right subcostal region.  She had had [employer] for lunch that day but had baked chicken and wild rice for dinner before the pain set in. Ultrasound 12/22/2020 demonstrates gallstones up to 8 mm, no sonographic evidence of cholecystitis.  Common bile duct 4.8 mm.  Heterogenous echogenic liver likely related to diffuse hepatocellular disease including fatty infiltration.    Review of Systems: A complete review of systems was obtained from the patient.  I have reviewed this information and discussed as appropriate with the patient.  See HPI as well for other ROS.   Medical History: Past Medical History:  Diagnosis Date   Anxiety    Arthritis    COPD (chronic obstructive pulmonary disease) (CMS-HCC)    Hypertension    Sleep apnea     There is no problem list on file for this patient.   Past Surgical History:  Procedure Laterality Date   carpal tunnel surgery N/A    csection surgery     parathyroid surgery       Allergies  Allergen Reactions   Bupropion Other (See Comments)    Worsened depression  Worsened depression     Ibuprofen Other (See Comments) and Itching    Lower lip swelling  Lower lip swelling     Duloxetine Other (See Comments)   Mirabegron Other (See Comments)   Oxybutynin Chloride Other (See Comments)   Rosuvastatin Other (See Comments)    Current Outpatient Medications on File Prior to  Visit  Medication Sig Dispense Refill   ALPRAZolam (XANAX) 0.5 MG tablet alprazolam 0.5 mg tablet  TAKE 1 2 TO 1 (ONE HALF TO ONE) TABLET BY MOUTH AT BEDTIME AS NEEDED FOR SLEEP     FLUoxetine (PROZAC) 20 MG tablet 1 tablet (20 mg total)     naproxen (NAPROSYN) 500 MG tablet naproxen 500 mg tablet  TAKE 1 TABLET BY MOUTH TWICE DAILY     pravastatin (PRAVACHOL) 10 MG tablet Take 1 tablet (10 mg total) by mouth twice a week     triamterene-hydrochlorothiazide (MAXZIDE-25) 37.5-25 mg tablet Take 1 tablet by mouth every morning     No current facility-administered medications on file prior to visit.    Family History  Problem Relation Age of Onset   Obesity Sister    Breast cancer Sister      Social History   Tobacco Use  Smoking Status Former   Types: Cigarettes  Smokeless Tobacco Never     Social History   Socioeconomic History   Marital status: Married  Tobacco Use   Smoking status: Former    Types: Cigarettes   Smokeless tobacco: Never  Vaping Use   Vaping Use: Never used  Substance and Sexual Activity   Alcohol use: Never   Drug use: Never    Objective:    Vitals:   02/05/21 1028  BP: (!) 142/84  Pulse: 100  Temp: 36.4 C (97.6 F)  SpO2: 98%  Weight: (!) 135.4 kg (298 lb 6.4 oz)  Height: 165.1 cm (5\' 5" )    Body mass index is 49.66 kg/m.  Alert well-appearing Unlabored respirations Abdomen soft and nontender  Assessment and Plan:  Diagnoses and all orders for this visit:  Biliary colic -     CCS Case Posting Request; Future    I recommend proceeding with laparoscopic or robotic cholecystectomy with possible cholangiogram.  Discussed the relevant anatomy using a diagram to demonstrate, and went over surgical technique.  Discussed risks of surgery including bleeding, pain, scarring, intraabdominal injury specifically to the common bile duct and sequelae, bile leak, conversion to open surgery, failure to resolve symptoms, blood clots/ pulmonary  embolus, heart attack, pneumonia, stroke, death.  Her risk of complications is increased by severe obesity and other comorbidities.  Questions welcomed and answered to patient's satisfaction.   Rosalinda Seaman Raquel James, MD

## 2021-02-10 ENCOUNTER — Other Ambulatory Visit: Payer: Self-pay | Admitting: Acute Care

## 2021-02-10 DIAGNOSIS — Z87891 Personal history of nicotine dependence: Secondary | ICD-10-CM

## 2021-03-03 NOTE — Patient Instructions (Addendum)
DUE TO COVID-19 ONLY ONE VISITOR IS ALLOWED TO COME WITH YOU AND STAY IN THE WAITING ROOM ONLY DURING PRE OP AND PROCEDURE DAY OF SURGERY.           Your procedure is scheduled on: 03-23-21   Report to [facility] Main  Entrance   Report to admitting at    1145 AM     Call this number if you have problems the morning of surgery [phone]   Remember: Do not eat food  :After Midnight. You may have clear liquids until 1100 am the day of your surgery then nothing by mouth    CLEAR LIQUID DIET                                                                    water Black Coffee and tea, regular and decaf No Creamer                            Plain Jell-O any favor except red or purple                                  Fruit ices (not with fruit pulp)                                      Iced Popsicles                                     Carbonated beverages, regular and diet                                    Cranberry, grape and apple juices Sports drinks like Gatorade Lightly seasoned clear broth or consume(fat free) Sugar, honey  ______      BRUSH YOUR TEETH MORNING OF SURGERY AND RINSE YOUR MOUTH OUT, NO CHEWING GUM CANDY OR MINTS.     Take these medicines the morning of surgery with A SIP OF WATER: prozac                                 You may not have any metal on your body including hair pins and              piercings  Do not wear jewelry, make-up, lotions, powders,perfumes,        deodorant             Do not wear nail polish on your fingernails or toenails .  Do not shave  48 hours prior to surgery.         Do not bring valuables to the hospital. [place].  Contacts, dentures or bridgework may not be worn into surgery.       Patients  discharged the day of surgery will not be allowed to drive home. IF YOU ARE HAVING SURGERY AND GOING HOME THE SAME DAY, YOU MUST HAVE AN ADULT TO DRIVE YOU HOME AND BE  WITH YOU FOR 24 HOURS. YOU MAY GO HOME BY TAXI OR UBER OR ORTHERWISE, BUT AN ADULT MUST ACCOMPANY YOU HOME AND STAY WITH YOU FOR 24 HOURS.  Name and phone number of your driver:  Special Instructions: N/A              Please read over the following fact sheets you were given: _____________________________________________________________________             [facility] - Preparing for Surgery Before surgery, you can play an important role.  Because skin is not sterile, your skin needs to be as free of germs as possible.  You can reduce the number of germs on your skin by washing with CHG (chlorahexidine gluconate) soap before surgery.  CHG is an antiseptic cleaner which kills germs and bonds with the skin to continue killing germs even after washing. Please DO NOT use if you have an allergy to CHG or antibacterial soaps.  If your skin becomes reddened/irritated stop using the CHG and inform your nurse when you arrive at Short Stay. Do not shave (including legs and underarms) for at least 48 hours prior to the first CHG shower.  You may shave your face/neck. Please follow these instructions carefully:  1.  Shower with CHG Soap the night before surgery and the  morning of Surgery.  2.  If you choose to wash your hair, wash your hair first as usual with your  normal  shampoo.  3.  After you shampoo, rinse your hair and body thoroughly to remove the  shampoo.                           4.  Use CHG as you would any other liquid soap.  You can apply chg directly  to the skin and wash                       Gently with a scrungie or clean washcloth.  5.  Apply the CHG Soap to your body ONLY FROM THE NECK DOWN.   Do not use on face/ open                           Wound or open sores. Avoid contact with eyes, ears mouth and genitals (private parts).                       Wash face,  Genitals (private parts) with your normal soap.             6.  Wash thoroughly, paying special attention to the area where  your surgery  will be performed.  7.  Thoroughly rinse your body with warm water from the neck down.  8.  DO NOT shower/wash with your normal soap after using and rinsing off  the CHG Soap.                9.  Pat yourself dry with a clean towel.            10.  Wear clean pajamas.            11.  Place clean sheets on your bed the night  of your first shower and do not  sleep with pets. Day of Surgery : Do not apply any lotions/deodorants the morning of surgery.  Please wear clean clothes to the hospital/surgery center.  FAILURE TO FOLLOW THESE INSTRUCTIONS MAY RESULT IN THE CANCELLATION OF YOUR SURGERY PATIENT SIGNATURE_________________________________  NURSE SIGNATURE__________________________________  ________________________________________________________________________

## 2021-03-03 NOTE — Progress Notes (Addendum)
PCP -  Cardiologist -   PPM/ICD -  Device Orders -  Rep Notified -   Chest x-ray -  EKG -  Stress Test -  ECHO -  Cardiac Cath -   Sleep Study -  CPAP -   Fasting Blood Sugar -  Checks Blood Sugar _____ times a day  Blood Thinner Instructions: Aspirin Instructions:  ERAS Protcol - PRE-SURGERY Ensure or G2-   COVID TEST-  COVID vaccine -  Activity-- Anesthesia review:   Patient denies shortness of breath, fever, cough and chest pain at PAT appointment   All instructions explained to the patient, with a verbal understanding of the material. Patient agrees to go over the instructions while at home for a better understanding. Patient also instructed to self quarantine after being tested for COVID-19. The opportunity to ask questions was provided.   

## 2021-03-04 ENCOUNTER — Other Ambulatory Visit: Payer: Self-pay

## 2021-03-04 ENCOUNTER — Encounter (HOSPITAL_COMMUNITY): Payer: Self-pay

## 2021-03-04 ENCOUNTER — Encounter (HOSPITAL_COMMUNITY)
Admission: RE | Admit: 2021-03-04 | Discharge: 2021-03-04 | Disposition: A | Discharge status: Home / Self Care | Payer: PPO | Source: Ambulatory Visit | Attending: Surgery | Admitting: Surgery

## 2021-03-04 VITALS — BP 139/83 | HR 75 | Temp 98.1°F | Resp 16 | Ht 65.5 in | Wt 296.0 lb

## 2021-03-04 DIAGNOSIS — Z01818 Encounter for other preprocedural examination: Secondary | ICD-10-CM | POA: Insufficient documentation

## 2021-03-04 DIAGNOSIS — K8050 Calculus of bile duct without cholangitis or cholecystitis without obstruction: Secondary | ICD-10-CM | POA: Diagnosis not present

## 2021-03-04 DIAGNOSIS — G4730 Sleep apnea, unspecified: Secondary | ICD-10-CM | POA: Diagnosis not present

## 2021-03-04 DIAGNOSIS — Z87891 Personal history of nicotine dependence: Secondary | ICD-10-CM | POA: Diagnosis not present

## 2021-03-04 DIAGNOSIS — I10 Essential (primary) hypertension: Secondary | ICD-10-CM | POA: Diagnosis not present

## 2021-03-04 DIAGNOSIS — J449 Chronic obstructive pulmonary disease, unspecified: Secondary | ICD-10-CM | POA: Diagnosis not present

## 2021-03-04 HISTORY — DX: Prediabetes: R73.03

## 2021-03-04 HISTORY — DX: Essential (primary) hypertension: I10

## 2021-03-04 HISTORY — DX: Chronic obstructive pulmonary disease, unspecified: J44.9

## 2021-03-04 HISTORY — DX: Diverticulitis of intestine, part unspecified, without perforation or abscess without bleeding: K57.92

## 2021-03-04 LAB — CBC
HCT: 44.1 % (ref 36.0–46.0)
Hemoglobin: 14.4 g/dL (ref 12.0–15.0)
MCH: 30.3 pg (ref 26.0–34.0)
MCHC: 32.7 g/dL (ref 30.0–36.0)
MCV: 92.8 fL (ref 80.0–100.0)
Platelets: 312 10*3/uL (ref 150–400)
RBC: 4.75 MIL/uL (ref 3.87–5.11)
RDW: 12.9 % (ref 11.5–15.5)
WBC: 5.1 10*3/uL (ref 4.0–10.5)
nRBC: 0 % (ref 0.0–0.2)

## 2021-03-04 LAB — BASIC METABOLIC PANEL
Anion gap: 9 (ref 5–15)
BUN: 22 mg/dL (ref 8–23)
CO2: 25 mmol/L (ref 22–32)
Calcium: 9.4 mg/dL (ref 8.9–10.3)
Chloride: 101 mmol/L (ref 98–111)
Creatinine, Ser: 1.1 mg/dL — ABNORMAL HIGH (ref 0.44–1.00)
GFR, Estimated: 54 mL/min — ABNORMAL LOW (ref >60)
Glucose, Bld: 104 mg/dL — ABNORMAL HIGH (ref 70–99)
Potassium: 4.3 mmol/L (ref 3.5–5.1)
Sodium: 135 mmol/L (ref 135–145)

## 2021-03-04 LAB — HEMOGLOBIN A1C
Hgb A1c MFr Bld: 5.8 % — ABNORMAL HIGH (ref 4.8–5.6)
Mean Plasma Glucose: 119.76 mg/dL

## 2021-03-04 LAB — GLUCOSE, CAPILLARY: Glucose-Capillary: 105 mg/dL — ABNORMAL HIGH (ref 70–99)

## 2021-03-10 ENCOUNTER — Encounter (HOSPITAL_COMMUNITY): Payer: Self-pay | Admitting: Physician Assistant

## 2021-03-10 ENCOUNTER — Encounter (HOSPITAL_COMMUNITY): Payer: Self-pay | Admitting: Anesthesiology

## 2021-03-11 DIAGNOSIS — F331 Major depressive disorder, recurrent, moderate: Secondary | ICD-10-CM | POA: Diagnosis not present

## 2021-03-11 DIAGNOSIS — E2839 Other primary ovarian failure: Secondary | ICD-10-CM | POA: Diagnosis not present

## 2021-03-11 DIAGNOSIS — Z0000 Encounter for general adult medical examination without abnormal findings: Secondary | ICD-10-CM | POA: Diagnosis not present

## 2021-03-11 DIAGNOSIS — J449 Chronic obstructive pulmonary disease, unspecified: Secondary | ICD-10-CM | POA: Diagnosis not present

## 2021-03-11 DIAGNOSIS — H6123 Impacted cerumen, bilateral: Secondary | ICD-10-CM | POA: Diagnosis not present

## 2021-03-11 DIAGNOSIS — I700 Atherosclerosis of aorta: Secondary | ICD-10-CM | POA: Diagnosis not present

## 2021-03-11 DIAGNOSIS — R609 Edema, unspecified: Secondary | ICD-10-CM | POA: Diagnosis not present

## 2021-03-11 DIAGNOSIS — N898 Other specified noninflammatory disorders of vagina: Secondary | ICD-10-CM | POA: Diagnosis not present

## 2021-03-11 DIAGNOSIS — I10 Essential (primary) hypertension: Secondary | ICD-10-CM | POA: Diagnosis not present

## 2021-03-11 DIAGNOSIS — Z23 Encounter for immunization: Secondary | ICD-10-CM | POA: Diagnosis not present

## 2021-03-11 DIAGNOSIS — R7303 Prediabetes: Secondary | ICD-10-CM | POA: Diagnosis not present

## 2021-03-12 NOTE — Progress Notes (Signed)
Anesthesia Chart Review   Case: [id number] Date/Time: 03/23/21 1345   Procedure: XI ROBOTIC ASSISTED LAPAROSCOPIC CHOLECYSTECTOMY   Anesthesia type: General   Pre-op diagnosis: BILIARY COLIC   Location: [facility] ROOM 02 / [facility]   Surgeons: Clovis Riley, MD       DISCUSSION:70 y.o. former smoker with h/o sleep apnea, COPD, HTN, biliary colic scheduled for above procedure 03/23/2021 with Dr. Romana Juniper.   Difficult intubation with procedure 06/11/2016,   "Laryngoscope Size: Glidescope and 4 Grade View: Grade II Tube type: Oral Tube size: 7.0 mm Number of attempts: 3 Airway Equipment and Method: Video-laryngoscopy Placement Confirmation: ETT inserted through vocal cords under direct vision,  positive ETCO2 and breath sounds checked- equal and bilateral Secured at: 22 cm Tube secured with: Tape Dental Injury: Bloody posterior oropharynx  Difficulty Due To: Difficulty was anticipated, Difficult Airway- due to large tongue, Difficult Airway- due to anterior larynx and Difficult Airway- due to limited oral opening Future Recommendations: Recommend- induction with short-acting agent, and alternative techniques readily available Comments: Attempted laryngoscopy with Miller 2 and unable to visualize cords.  Tissue noted dependent of tracheal opening that blocked opening.  Unable to visualize with MAC 4.  Proceeded to glidescope.  Cords visualized anteriorly."  Anticipate pt can proceed with planned procedure barring acute status change.   VS: BP 139/83    Pulse 75    Temp 36.7 C (Oral)    Resp 16    Ht 5' 5.5" (1.664 m)    Wt 134.3 kg    SpO2 98%    BMI 48.51 kg/m   PROVIDERS: Harlan Stains, MD is PCP    LABS: Labs reviewed: Acceptable for surgery. (all labs ordered are listed, but only abnormal results are displayed)  Labs Reviewed  BASIC METABOLIC PANEL - Abnormal; Notable for the following components:      Result Value   Glucose, Bld 104 (*)    Creatinine, Ser 1.10 (*)     GFR, Estimated 54 (*)    All other components within normal limits  HEMOGLOBIN A1C - Abnormal; Notable for the following components:   Hgb A1c MFr Bld 5.8 (*)    All other components within normal limits  GLUCOSE, CAPILLARY - Abnormal; Notable for the following components:   Glucose-Capillary 105 (*)    All other components within normal limits  CBC     IMAGES:   EKG: 03/04/2021 Rate 80 bpm  Normal sinus rhythm Incomplete right bundle branch block Borderline ECG No previous ECGs available  CV:  Past Medical History:  Diagnosis Date   Alcohol abuse    Anxiety    Arthritis    bil knees and neck   Asthma    excersice   B12 deficiency    Back pain    Claustrophobia    Claustrophobia    Colon polyps    Constipation    COPD (chronic obstructive pulmonary disease) ([place])    Depression    Difficult intubation 06/11/2016   glidescope grade 2   Diverticulitis    Family history of adverse reaction to anesthesia    sister has vomiting   Family history of bladder cancer    Family history of breast cancer    Family history of colon cancer    Family history of thyroid cancer    H/O rosacea    Hyperparathyroidism ([place])    Hypertension    Joint pain    Multiple thyroid nodules    Multiple thyroid nodules  benign has checked yearly   Palpitations    Pre-diabetes    Skin cancer, basal cell    Sleep apnea    no cpap   SOB (shortness of breath)    Squamous cell skin cancer    Swelling of both lower extremities    Thyroid disease    Vitamin D deficiency     Past Surgical History:  Procedure Laterality Date   CARPAL TUNNEL RELEASE     Bilateral   CESAREAN SECTION     x 2   ENDOVENOUS ABLATION SAPHENOUS VEIN W/ LASER Left 11/16/2016   endovenous laser ablation L GSV and stab phlebectomy > 20 incisions left leg by Tinnie Gens MD    PARATHYROIDECTOMY Left 06/11/2016   Procedure: LEFT PARATHYROIDECTOMY;  Surgeon: Armandina Gemma, MD;  Location: [facility];  Service: General;   Laterality: Left;   TONSILLECTOMY AND ADENOIDECTOMY     TUBAL LIGATION      MEDICATIONS:  acetaminophen (TYLENOL) 500 MG tablet   acetaminophen (TYLENOL) 650 MG CR tablet   ALPRAZolam (XANAX) 0.5 MG tablet   ANORO ELLIPTA 62.5-25 MCG/ACT AEPB   Cholecalciferol (VITAMIN D3) 2000 units capsule   Cyanocobalamin (B-12 PO)   desonide (DESOWEN) 0.05 % cream   diclofenac Sodium (VOLTAREN) 1 % GEL   doxycycline (VIBRAMYCIN) 100 MG capsule   FLUoxetine (PROZAC) 10 MG capsule   fluticasone (FLONASE) 50 MCG/ACT nasal spray   furosemide (LASIX) 40 MG tablet   ketoconazole (NIZORAL) 2 % cream   LINZESS 290 MCG CAPS capsule   Loteprednol-Tobramycin (ZYLET OP)   metFORMIN (GLUCOPHAGE-XR) 500 MG 24 hr tablet   metroNIDAZOLE (FLAGYL) 500 MG tablet   Multiple Vitamin (MULTIVITAMIN WITH MINERALS) TABS tablet   naproxen (NAPROSYN) 500 MG tablet   pravastatin (PRAVACHOL) 10 MG tablet   sulfamethoxazole-trimethoprim (BACTRIM DS) 800-160 MG tablet   triamterene-hydrochlorothiazide (MAXZIDE-25) 37.5-25 MG tablet   No current facility-administered medications for this encounter.    Konrad Felix Ward, PA-C [facility] [phone]

## 2021-03-13 DIAGNOSIS — H43813 Vitreous degeneration, bilateral: Secondary | ICD-10-CM | POA: Diagnosis not present

## 2021-03-13 NOTE — Anesthesia Preprocedure Evaluation (Deleted)
Anesthesia Evaluation    Airway        Dental   Pulmonary former smoker,           Cardiovascular hypertension,      Neuro/Psych    GI/Hepatic   Endo/Other    Renal/GU      Musculoskeletal   Abdominal   Peds  Hematology   Anesthesia Other Findings   Reproductive/Obstetrics                             Anesthesia Physical Anesthesia Plan  ASA:   Anesthesia Plan:    Post-op Pain Management:    Induction:   PONV Risk Score and Plan:   Airway Management Planned:   Additional Equipment:   Intra-op Plan:   Post-operative Plan:   Informed Consent:   Plan Discussed with:   Anesthesia Plan Comments: (See PAT note 03/04/2021, Konrad Felix Ward, PA-C)        Anesthesia Quick Evaluation

## 2021-03-22 ENCOUNTER — Encounter (HOSPITAL_COMMUNITY): Payer: Self-pay | Admitting: Surgery

## 2021-03-23 ENCOUNTER — Ambulatory Visit (HOSPITAL_COMMUNITY): Admission: RE | Admit: 2021-03-23 | Payer: PPO | Source: Home / Self Care | Admitting: Surgery

## 2021-03-23 DIAGNOSIS — M48062 Spinal stenosis, lumbar region with neurogenic claudication: Secondary | ICD-10-CM | POA: Diagnosis not present

## 2021-03-23 DIAGNOSIS — M5416 Radiculopathy, lumbar region: Secondary | ICD-10-CM | POA: Diagnosis not present

## 2021-03-23 SURGERY — CHOLECYSTECTOMY, ROBOT-ASSISTED, LAPAROSCOPIC
Anesthesia: General

## 2021-03-27 ENCOUNTER — Encounter (HOSPITAL_COMMUNITY): Payer: Self-pay | Admitting: Surgery

## 2021-03-27 NOTE — Progress Notes (Signed)
Tammy Mccoy made aware to arrive at 10:30 AM 03/31/2021 reviewed pre op instructions verbalized understanding. Updated her medication list, no changes to allergies or medical history noted by the patient at this time.

## 2021-03-31 ENCOUNTER — Other Ambulatory Visit: Payer: Self-pay

## 2021-03-31 ENCOUNTER — Encounter (HOSPITAL_COMMUNITY)
Admission: RE | Disposition: A | Discharge status: Home / Self Care | Payer: Self-pay | Source: Home / Self Care | Attending: Surgery

## 2021-03-31 ENCOUNTER — Encounter (HOSPITAL_COMMUNITY): Payer: Self-pay | Admitting: Surgery

## 2021-03-31 ENCOUNTER — Ambulatory Visit (HOSPITAL_COMMUNITY)
Admission: RE | Admit: 2021-03-31 | Discharge: 2021-03-31 | Disposition: A | Discharge status: Home / Self Care | Payer: PPO | Attending: Surgery | Admitting: Surgery

## 2021-03-31 ENCOUNTER — Ambulatory Visit (HOSPITAL_COMMUNITY): Payer: PPO | Admitting: Anesthesiology

## 2021-03-31 DIAGNOSIS — M1990 Unspecified osteoarthritis, unspecified site: Secondary | ICD-10-CM | POA: Insufficient documentation

## 2021-03-31 DIAGNOSIS — F419 Anxiety disorder, unspecified: Secondary | ICD-10-CM | POA: Insufficient documentation

## 2021-03-31 DIAGNOSIS — Z6841 Body Mass Index (BMI) 40.0 and over, adult: Secondary | ICD-10-CM | POA: Insufficient documentation

## 2021-03-31 DIAGNOSIS — F32A Depression, unspecified: Secondary | ICD-10-CM | POA: Insufficient documentation

## 2021-03-31 DIAGNOSIS — Z87891 Personal history of nicotine dependence: Secondary | ICD-10-CM | POA: Diagnosis not present

## 2021-03-31 DIAGNOSIS — R7303 Prediabetes: Secondary | ICD-10-CM | POA: Insufficient documentation

## 2021-03-31 DIAGNOSIS — I10 Essential (primary) hypertension: Secondary | ICD-10-CM | POA: Insufficient documentation

## 2021-03-31 DIAGNOSIS — K760 Fatty (change of) liver, not elsewhere classified: Secondary | ICD-10-CM | POA: Diagnosis not present

## 2021-03-31 DIAGNOSIS — J449 Chronic obstructive pulmonary disease, unspecified: Secondary | ICD-10-CM | POA: Insufficient documentation

## 2021-03-31 DIAGNOSIS — Z79899 Other long term (current) drug therapy: Secondary | ICD-10-CM | POA: Insufficient documentation

## 2021-03-31 DIAGNOSIS — K8010 Calculus of gallbladder with chronic cholecystitis without obstruction: Secondary | ICD-10-CM | POA: Insufficient documentation

## 2021-03-31 DIAGNOSIS — G4730 Sleep apnea, unspecified: Secondary | ICD-10-CM | POA: Insufficient documentation

## 2021-03-31 DIAGNOSIS — K8044 Calculus of bile duct with chronic cholecystitis without obstruction: Secondary | ICD-10-CM | POA: Diagnosis not present

## 2021-03-31 LAB — GLUCOSE, CAPILLARY
Glucose-Capillary: 92 mg/dL (ref 70–99)
Glucose-Capillary: 128 mg/dL — ABNORMAL HIGH (ref 70–99)

## 2021-03-31 SURGERY — CHOLECYSTECTOMY, ROBOT-ASSISTED, LAPAROSCOPIC
Anesthesia: General | Site: Abdomen

## 2021-03-31 MED ORDER — SUGAMMADEX SODIUM 200 MG/2ML IV SOLN
INTRAVENOUS | Status: DC | PRN
  Administered 2021-03-31: 300 mg via INTRAVENOUS

## 2021-03-31 MED ORDER — SODIUM CHLORIDE 0.9% FLUSH: 3.0000 mL | INTRAVENOUS | Status: DC | PRN

## 2021-03-31 MED ORDER — CEFAZOLIN IN SODIUM CHLORIDE 3-0.9 GM/100ML-% IV SOLN
3.0000 g | INTRAVENOUS | Status: AC
  Administered 2021-03-31: 3 g via INTRAVENOUS
  Filled 2021-03-31: qty 100

## 2021-03-31 MED ORDER — DEXAMETHASONE SODIUM PHOSPHATE 10 MG/ML IJ SOLN
INTRAMUSCULAR | Status: AC
  Filled 2021-03-31: qty 1

## 2021-03-31 MED ORDER — BUPIVACAINE-EPINEPHRINE 0.25% -1:200000 IJ SOLN
INTRAMUSCULAR | Status: DC | PRN
  Administered 2021-03-31: 30 mL

## 2021-03-31 MED ORDER — ORAL CARE MOUTH RINSE: 15.0000 mL | Freq: Once | OROMUCOSAL | Status: AC

## 2021-03-31 MED ORDER — FENTANYL CITRATE PF 50 MCG/ML IJ SOSY: 25.0000 ug | PREFILLED_SYRINGE | INTRAMUSCULAR | Status: DC | PRN

## 2021-03-31 MED ORDER — SUGAMMADEX SODIUM 500 MG/5ML IV SOLN
INTRAVENOUS | Status: AC
  Filled 2021-03-31: qty 5

## 2021-03-31 MED ORDER — OXYCODONE HCL 5 MG PO TABS: 5.0000 mg | ORAL_TABLET | ORAL | Status: DC | PRN

## 2021-03-31 MED ORDER — FENTANYL CITRATE (PF) 250 MCG/5ML IJ SOLN
INTRAMUSCULAR | Status: DC | PRN
  Administered 2021-03-31: 50 ug via INTRAVENOUS
  Administered 2021-03-31: 100 ug via INTRAVENOUS
  Administered 2021-03-31: 50 ug via INTRAVENOUS

## 2021-03-31 MED ORDER — ACETAMINOPHEN 500 MG PO TABS
1000.0000 mg | ORAL_TABLET | ORAL | Status: AC
  Administered 2021-03-31: 1000 mg via ORAL
  Filled 2021-03-31: qty 2

## 2021-03-31 MED ORDER — ROCURONIUM BROMIDE 10 MG/ML (PF) SYRINGE
PREFILLED_SYRINGE | INTRAVENOUS | Status: DC | PRN
  Administered 2021-03-31: 70 mg via INTRAVENOUS
  Administered 2021-03-31: 10 mg via INTRAVENOUS

## 2021-03-31 MED ORDER — ACETAMINOPHEN 650 MG RE SUPP
650.0000 mg | RECTAL | Status: DC | PRN
  Filled 2021-03-31: qty 1

## 2021-03-31 MED ORDER — MIDAZOLAM HCL 2 MG/2ML IJ SOLN
INTRAMUSCULAR | Status: DC | PRN
  Administered 2021-03-31: 2 mg via INTRAVENOUS

## 2021-03-31 MED ORDER — ROCURONIUM BROMIDE 10 MG/ML (PF) SYRINGE
PREFILLED_SYRINGE | INTRAVENOUS | Status: AC
  Filled 2021-03-31: qty 10

## 2021-03-31 MED ORDER — ONDANSETRON HCL 4 MG/2ML IJ SOLN
INTRAMUSCULAR | Status: AC
  Filled 2021-03-31: qty 2

## 2021-03-31 MED ORDER — FENTANYL CITRATE (PF) 250 MCG/5ML IJ SOLN
INTRAMUSCULAR | Status: AC
  Filled 2021-03-31: qty 5

## 2021-03-31 MED ORDER — ACETAMINOPHEN 325 MG PO TABS: 650.0000 mg | ORAL_TABLET | ORAL | Status: DC | PRN

## 2021-03-31 MED ORDER — CHLORHEXIDINE GLUCONATE 4 % EX LIQD: 60.0000 mL | Freq: Once | CUTANEOUS | Status: DC

## 2021-03-31 MED ORDER — CEFAZOLIN SODIUM 1 G IJ SOLR
INTRAMUSCULAR | Status: AC
  Filled 2021-03-31: qty 10

## 2021-03-31 MED ORDER — LACTATED RINGERS IV SOLN: INTRAVENOUS | Status: DC

## 2021-03-31 MED ORDER — LIDOCAINE 2% (20 MG/ML) 5 ML SYRINGE
INTRAMUSCULAR | Status: DC | PRN
  Administered 2021-03-31: 100 mg via INTRAVENOUS

## 2021-03-31 MED ORDER — EPHEDRINE SULFATE-NACL 50-0.9 MG/10ML-% IV SOSY
PREFILLED_SYRINGE | INTRAVENOUS | Status: DC | PRN
  Administered 2021-03-31 (×2): 10 mg via INTRAVENOUS

## 2021-03-31 MED ORDER — PROPOFOL 10 MG/ML IV BOLUS
INTRAVENOUS | Status: AC
  Filled 2021-03-31: qty 20

## 2021-03-31 MED ORDER — PROPOFOL 10 MG/ML IV BOLUS
INTRAVENOUS | Status: DC | PRN
  Administered 2021-03-31: 170 mg via INTRAVENOUS

## 2021-03-31 MED ORDER — EPHEDRINE 5 MG/ML INJ
INTRAVENOUS | Status: AC
  Filled 2021-03-31: qty 5

## 2021-03-31 MED ORDER — BUPIVACAINE LIPOSOME 1.3 % IJ SUSP
INTRAMUSCULAR | Status: AC
  Filled 2021-03-31: qty 20

## 2021-03-31 MED ORDER — GABAPENTIN 300 MG PO CAPS
300.0000 mg | ORAL_CAPSULE | ORAL | Status: AC
  Administered 2021-03-31: 300 mg via ORAL
  Filled 2021-03-31: qty 1

## 2021-03-31 MED ORDER — BUPIVACAINE LIPOSOME 1.3 % IJ SUSP: 20.0000 mL | Freq: Once | INTRAMUSCULAR | Status: DC

## 2021-03-31 MED ORDER — DEXAMETHASONE SODIUM PHOSPHATE 10 MG/ML IJ SOLN
INTRAMUSCULAR | Status: DC | PRN
  Administered 2021-03-31: 10 mg via INTRAVENOUS

## 2021-03-31 MED ORDER — TRAMADOL HCL 50 MG PO TABS: 50.0000 mg | ORAL_TABLET | Freq: Four times a day (QID) | ORAL | 0 refills | Status: AC | PRN

## 2021-03-31 MED ORDER — SODIUM CHLORIDE 0.9 % IV SOLN: 250.0000 mL | INTRAVENOUS | Status: DC | PRN

## 2021-03-31 MED ORDER — BUPIVACAINE LIPOSOME 1.3 % IJ SUSP
INTRAMUSCULAR | Status: DC | PRN
  Administered 2021-03-31: 20 mL

## 2021-03-31 MED ORDER — INDOCYANINE GREEN 25 MG IV SOLR
2.5000 mg | Freq: Once | INTRAVENOUS | Status: AC
  Administered 2021-03-31: 2.5 mg via INTRAVENOUS
  Filled 2021-03-31: qty 1

## 2021-03-31 MED ORDER — ONDANSETRON HCL 4 MG/2ML IJ SOLN
INTRAMUSCULAR | Status: DC | PRN
  Administered 2021-03-31: 4 mg via INTRAVENOUS

## 2021-03-31 MED ORDER — SODIUM CHLORIDE 0.9% FLUSH: 3.0000 mL | Freq: Two times a day (BID) | INTRAVENOUS | Status: DC

## 2021-03-31 MED ORDER — BUPIVACAINE-EPINEPHRINE (PF) 0.25% -1:200000 IJ SOLN
INTRAMUSCULAR | Status: AC
  Filled 2021-03-31: qty 30

## 2021-03-31 MED ORDER — CHLORHEXIDINE GLUCONATE 0.12 % MT SOLN
15.0000 mL | Freq: Once | OROMUCOSAL | Status: AC
  Administered 2021-03-31: 15 mL via OROMUCOSAL

## 2021-03-31 MED ORDER — 0.9 % SODIUM CHLORIDE (POUR BTL) OPTIME
TOPICAL | Status: DC | PRN
  Administered 2021-03-31: 1000 mL

## 2021-03-31 MED ORDER — HYDROMORPHONE HCL 1 MG/ML IJ SOLN: 0.2500 mg | INTRAMUSCULAR | Status: DC | PRN

## 2021-03-31 MED ORDER — MIDAZOLAM HCL 2 MG/2ML IJ SOLN
INTRAMUSCULAR | Status: AC
  Filled 2021-03-31: qty 2

## 2021-03-31 SURGICAL SUPPLY — 54 items
ADH SKN CLS APL DERMABOND .7 (GAUZE/BANDAGES/DRESSINGS)
APL PRP STRL LF DISP 70% ISPRP (MISCELLANEOUS) ×2
APPLIER CLIP 5 13 M/L LIGAMAX5 (MISCELLANEOUS)
APR CLP MED LRG 5 ANG JAW (MISCELLANEOUS)
BAG COUNTER SPONGE SURGICOUNT (BAG) ×2 IMPLANT
BAG SPNG CNTER NS LX DISP (BAG) ×1
BLADE SURG SZ11 CARB STEEL (BLADE) ×3 IMPLANT
CHLORAPREP W/TINT 26 (MISCELLANEOUS) ×3 IMPLANT
CLIP APPLIE 5 13 M/L LIGAMAX5 (MISCELLANEOUS) IMPLANT
CLIP LIGATING HEM O LOK PURPLE (MISCELLANEOUS) ×2 IMPLANT
COVER TIP SHEARS 8 DVNC (MISCELLANEOUS) ×1 IMPLANT
COVER TIP SHEARS 8MM DA VINCI (MISCELLANEOUS) ×2
DECANTER SPIKE VIAL GLASS SM (MISCELLANEOUS) ×1 IMPLANT
DERMABOND ADVANCED (GAUZE/BANDAGES/DRESSINGS)
DERMABOND ADVANCED .7 DNX12 (GAUZE/BANDAGES/DRESSINGS) IMPLANT
DRAPE ARM DVNC X/XI (DISPOSABLE) ×4 IMPLANT
DRAPE COLUMN DVNC XI (DISPOSABLE) ×1 IMPLANT
DRAPE DA VINCI XI ARM (DISPOSABLE) ×8
DRAPE DA VINCI XI COLUMN (DISPOSABLE) ×2
ELECT REM PT RETURN 15FT ADLT (MISCELLANEOUS) ×2 IMPLANT
GLOVE SURG ENC MOIS LTX SZ6 (GLOVE) ×4 IMPLANT
GLOVE SURG MICRO LTX SZ6 (GLOVE) ×2 IMPLANT
GLOVE SURG UNDER LTX SZ6.5 (GLOVE) ×4 IMPLANT
GOWN STRL REUS W/TWL LRG LVL3 (GOWN DISPOSABLE) ×4 IMPLANT
GOWN STRL REUS W/TWL XL LVL3 (GOWN DISPOSABLE) IMPLANT
GRASPER SUT TROCAR 14GX15 (MISCELLANEOUS) ×2 IMPLANT
IRRIG SUCT STRYKERFLOW 2 WTIP (MISCELLANEOUS)
IRRIGATION SUCT STRKRFLW 2 WTP (MISCELLANEOUS) IMPLANT
KIT BASIN OR (CUSTOM PROCEDURE TRAY) ×2 IMPLANT
KIT PROCEDURE DA VINCI SI (MISCELLANEOUS)
KIT PROCEDURE DVNC SI (MISCELLANEOUS) IMPLANT
KIT TURNOVER KIT A (KITS) ×1 IMPLANT
MANIFOLD NEPTUNE II (INSTRUMENTS) ×2 IMPLANT
MAT PREVALON FULL STRYKER (MISCELLANEOUS) ×1 IMPLANT
NDL INSUFFLATION 14GA 120MM (NEEDLE) ×1 IMPLANT
NEEDLE HYPO 22GX1.5 SAFETY (NEEDLE) ×2 IMPLANT
NEEDLE INSUFFLATION 14GA 120MM (NEEDLE) ×2 IMPLANT
PACK CARDIOVASCULAR III (CUSTOM PROCEDURE TRAY) ×2 IMPLANT
SEAL CANN UNIV 5-8 DVNC XI (MISCELLANEOUS) ×4 IMPLANT
SEAL XI 5MM-8MM UNIVERSAL (MISCELLANEOUS) ×8
SEALER VESSEL DA VINCI XI (MISCELLANEOUS)
SEALER VESSEL EXT DVNC XI (MISCELLANEOUS) IMPLANT
SOL ANTI FOG 6CC (MISCELLANEOUS) ×1 IMPLANT
SOLUTION ANTI FOG 6CC (MISCELLANEOUS) ×1
SOLUTION ELECTROLUBE (MISCELLANEOUS) ×2 IMPLANT
SPONGE T-LAP 18X18 ~~LOC~~+RFID (SPONGE) ×2 IMPLANT
SUT MNCRL AB 4-0 PS2 18 (SUTURE) ×2 IMPLANT
SYR 20ML LL LF (SYRINGE) ×2 IMPLANT
SYS RETRIEVAL 5MM INZII UNIV (BASKET) ×2
SYSTEM RETRIEVL 5MM INZII UNIV (BASKET) ×1 IMPLANT
TOWEL OR 17X26 10 PK STRL BLUE (TOWEL DISPOSABLE) ×2 IMPLANT
TOWEL OR NON WOVEN STRL DISP B (DISPOSABLE) ×2 IMPLANT
TRAY FOLEY MTR SLVR 16FR STAT (SET/KITS/TRAYS/PACK) IMPLANT
TUBING INSUFFLATION 10FT LAP (TUBING) ×2 IMPLANT

## 2021-03-31 NOTE — Transfer of Care (Signed)
Immediate Anesthesia Transfer of Care Note  Patient: Tammy Mccoy  Procedure(s) Performed: XI ROBOTIC ASSISTED LAPAROSCOPIC CHOLECYSTECTOMY (Abdomen)  Patient Location: PACU  Anesthesia Type:General  Level of Consciousness: awake, alert  and oriented  Airway & Oxygen Therapy: Patient Spontanous Breathing and Patient connected to face mask oxygen  Post-op Assessment: Report given to RN and Post -op Vital signs reviewed and stable  Post vital signs: Reviewed and stable  Last Vitals:  Vitals Value Taken Time  BP 152/67 03/31/21 1533  Temp    Pulse 86 03/31/21 1534  Resp 15 03/31/21 1534  SpO2 97 % 03/31/21 1534  Vitals shown include unvalidated device data.  Last Pain:  Vitals:   03/31/21 1054  TempSrc: Oral  PainSc: 2       Patients Stated Pain Goal: 3 (03/31/41 8887)  Complications: No notable events documented.

## 2021-03-31 NOTE — Anesthesia Postprocedure Evaluation (Signed)
Anesthesia Post Note  Patient: Nicholette Dolson  Procedure(s) Performed: XI ROBOTIC ASSISTED LAPAROSCOPIC CHOLECYSTECTOMY (Abdomen)     Patient location during evaluation: PACU Anesthesia Type: General Level of consciousness: awake and alert Pain management: pain level controlled Vital Signs Assessment: post-procedure vital signs reviewed and stable Respiratory status: spontaneous breathing, nonlabored ventilation and respiratory function stable Cardiovascular status: blood pressure returned to baseline and stable Postop Assessment: no apparent nausea or vomiting Anesthetic complications: no   No notable events documented.  Last Vitals:  Vitals:   03/31/21 1545 03/31/21 1600  BP: (!) 148/71 (!) 147/71  Pulse: 87 86  Resp: 18 16  Temp:  36.7 C  SpO2: 100% 94%    Last Pain:  Vitals:   03/31/21 1600  TempSrc:   PainSc: 4                  Charlestine Rookstool,W. EDMOND

## 2021-03-31 NOTE — Interval H&P Note (Signed)
History and Physical Interval Note:  03/31/2021 12:53 PM  Tammy Mccoy  has presented today for surgery, with the diagnosis of BILIARY COLIC.  The various methods of treatment have been discussed with the patient and family. After consideration of risks, benefits and other options for treatment, the patient has consented to  Procedure(s): XI ROBOTIC [place] (N/A) as a surgical intervention.  The patient's history has been reviewed, patient examined, no change in status, stable for surgery.  I have reviewed the patient's chart and labs.  Questions were answered to the patient's satisfaction.     Malley Hauter Rich Brave

## 2021-03-31 NOTE — Discharge Instructions (Signed)
LAPAROSCOPIC SURGERY: POST OP INSTRUCTIONS   EAT Gradually transition to a high fiber diet with a fiber supplement over the next few weeks after discharge.  Start with a pureed / full liquid diet (see below)  WALK Walk an hour a day (cumulative, not all at once).  Control your pain to do that.    CONTROL PAIN Control pain so that you can walk, sleep, tolerate sneezing/coughing, go up/down stairs.  HAVE A BOWEL MOVEMENT DAILY Keep your bowels regular to avoid problems.  OK to try a laxative to override constipation.  OK to use an antidairrheal to slow down diarrhea.  Call if not better after 2 tries  CALL IF YOU HAVE PROBLEMS/CONCERNS Call if you are still struggling despite following these instructions. Call if you have concerns not answered by these instructions    DIET: Follow a light bland diet & liquids the first 24 hours after arrival home, such as soup, liquids, starches, etc.  Be sure to drink plenty of fluids.  Quickly advance to a usual solid diet within a few days.  Avoid fast food or heavy meals as your are more likely to get nauseated or have irregular bowels.  A low-sugar, high-fiber diet for the rest of your life is ideal.  Take your usually prescribed home medications unless otherwise directed.  PAIN CONTROL: Pain is best controlled by a usual combination of three different methods TOGETHER: Ice/Heat Over the counter pain medication Prescription pain medication Most patients will experience some swelling and bruising around the incisions.  Ice packs or heating pads (30-60 minutes up to 6 times a day) will help. Use ice for the first few days to help decrease swelling and bruising, then switch to heat to help relax tight/sore spots and speed recovery.  Some people prefer to use ice alone, heat alone, alternating between ice & heat.  Experiment to what works for you.  Swelling and bruising can take several weeks to resolve.   It is helpful to take an over-the-counter pain  medication regularly for the first few days: Naproxen (Aleve, etc)  Two 220mg  tabs twice a day OR Ibuprofen (Advil, etc) Three 200mg  tabs four times a day (every meal & bedtime) AND Acetaminophen (Tylenol, etc) 500-650mg  four times a day (every meal & bedtime) A  prescription for pain medication (such as oxycodone, hydrocodone, tramadol, gabapentin, methocarbamol, etc) should be given to you upon discharge.  Take your pain medication as prescribed, IF NEEDED.  If you are having problems/concerns with the prescription medicine (does not control pain, nausea, vomiting, rash, itching, etc), please call us [phone] to see if we need to switch you to a different pain medicine that will work better for you and/or control your side effect better. If you need a refill on your pain medication, please give [place] 48 hour notice.  contact your pharmacy.  They will contact our office to request authorization. Prescriptions will not be filled after 5 pm or on week-ends  Avoid getting constipated.   Between the surgery and the pain medications, it is common to experience some constipation.   Increasing fluid intake and taking a fiber supplement (such as Metamucil, Citrucel, FiberCon, MiraLax, etc) 1-2 times a day regularly will usually help prevent this problem from occurring.   A mild laxative (prune juice, Milk of Magnesia, MiraLax, etc) should be taken according to package directions if there are no bowel movements after 48 hours.   Watch out for diarrhea.   If you have many loose  bowel movements, simplify your diet to bland foods & liquids for a few days.   Stop any stool softeners and decrease your fiber supplement.   Switching to mild anti-diarrheal medications (Kayopectate, Pepto Bismol) can help.   If this worsens or does not improve, please call us.  Wash / shower every day.  You may shower over the skin glue which is waterproof  Glue will flake off after about 2 weeks.  You may leave the incision  open to air.  You may replace a dressing/Band-Aid to cover the incision for comfort if you wish.   ACTIVITIES as tolerated:   You may resume regular (light) daily activities beginning the next day--such as daily self-care, walking, climbing stairs--gradually increasing activities as tolerated.  If you can walk 30 minutes without difficulty, it is safe to try more intense activity such as jogging, treadmill, bicycling, low-impact aerobics, swimming, etc. Save the most intensive and strenuous activity for last such as sit-ups, heavy lifting, contact sports, etc  Refrain from any heavy lifting or straining until you are off narcotics for pain control.   DO NOT PUSH THROUGH PAIN.  Let pain be your guide: If it hurts to do something, don't do it.  Pain is your body warning you to avoid that activity for another week until the pain goes down. You may drive when you are no longer taking prescription pain medication, you can comfortably wear a seatbelt, and you can safely maneuver your car and apply brakes. You may have sexual intercourse when it is comfortable.  FOLLOW UP in our office Please call [facility] at [phone] to set up an appointment to see your surgeon in the office for a follow-up appointment approximately 2-3 weeks after your surgery. Make sure that you call for this appointment the day you arrive home to insure a convenient appointment time.  10. IF YOU HAVE DISABILITY OR FAMILY LEAVE FORMS, BRING THEM TO THE OFFICE FOR PROCESSING.  DO NOT GIVE THEM TO YOUR DOCTOR.   WHEN TO CALL us [phone]: Poor pain control Reactions / problems with new medications (rash/itching, nausea, etc)  Fever over 101.5 F (38.5 C) Inability to urinate Nausea and/or vomiting Worsening swelling or bruising Continued bleeding from incision. Increased pain, redness, or drainage from the incision   The clinic staff is available to answer your questions during regular business hours (8:30am-5pm).  Please  dont hesitate to call and ask to speak to one of our nurses for clinical concerns.   If you have a medical emergency, go to the nearest emergency room or call 911.  A surgeon from [facility] is always on call at the [facility], [address], [place], [place],   [address] ? MAIN: [phone] ? TOLL FREE: [phone] ?  FAX [phone] [email or web address]

## 2021-03-31 NOTE — Op Note (Signed)
Operative Note  Tammy Mccoy 71 y.o. female [id number]  03/31/2021  Surgeon: Clovis Riley MD FACS  Assistant: Gurney Maxin MD FACS  Procedure performed: Robotic Cholecystectomy  Preop diagnosis: Biliary colic Post-op diagnosis/intraop findings: same, chronic cholecystitis, enlarged steatotic liver  Specimens: gallbladder  Retained items: none  EBL: minimal  Complications: none  Description of procedure:  After obtaining informed consent the patient was brought to the operating room. Antibiotics were administered. SCD's were applied. General endotracheal anesthesia was initiated and a formal time-out was performed. The abdomen was prepped and draped in the usual sterile fashion and peritoneal access gained using a left subcostal veress needle after instilling the site with local. Insufflation to 97mmHg was obtained, periumbilical 42mm trocar and camera inserted, and gross inspection revealed no evidence of injury from our entry or other intraabdominal abnormalities.  Bilateral laparoscopic assisted taps blocks were performed with Exparel mixed with quarter percent Marcaine.  Three additional 8 mm robotictrocars were introduced in the right midclavicular and right anterior axillary lines under direct visualization and following infiltration with local.  The robot was then docked and instruments inserted under direct visualization.   Omental adhesions to the gallbladder were taken down bluntly and with cautery.  The gallbladder was quite distended and dissection was difficult due to her enlarged liver as well as visceral adiposity.  The gallbladder was retracted cephalad and the infundibulum was retracted laterally. A combination of hook electrocautery and blunt dissection was utilized to clear the peritoneum from the neck and cystic duct, circumferentially isolating the cystic artery and cystic duct and lifting the gallbladder from the cystic plate. The critical view of safety was  achieved with the cystic artery, cystic duct, and liver bed visualized between them with no other structures.  This was concordant with the view on firefly.  The artery was clipped with a single clip proximally and distally and divided as was the cystic duct with three clips on the proximal end. The gallbladder was dissected from the liver plate using electrocautery.  Hemostasis along the liver bed was ensured with cautery as the dissection progressed. Once freed the gallbladder was placed in an endocatch bag and removed intact through the left upper quadrant trocar site.  Hemostasis was once again confirmed, and reinspection of the abdomen revealed no injuries. The clips were well opposed without any bile leak from the duct or the liver bed on direct visualization nor on firefly.  Some clear bile but no stones had spilled from the gallbladder during dissection from the liver bed, this was aspirated in the right upper quadrant was irrigated copiously and the effluent was clear.  The left upper quadrant extraction port site was closed with a 0 vicryl under direct visualization using a PMI device. The abdomen was desufflated and all trocars removed. The skin incisions were closed with subcuticular monocryl and Dermabond. The patient was awakened, extubated and transported to the recovery room in stable condition.     All counts were correct at the completion of the case.

## 2021-03-31 NOTE — Anesthesia Preprocedure Evaluation (Addendum)
Anesthesia Evaluation  Patient identified by MRN, date of birth, ID band Patient awake    Reviewed: Allergy & Precautions, H&P , NPO status , Patient's Chart, lab work & pertinent test results  History of Anesthesia Complications (+) DIFFICULT AIRWAY and Family history of anesthesia reaction  Airway Mallampati: III  TM Distance: >3 FB Neck ROM: Full    Dental no notable dental hx. (+) Teeth Intact, Dental Advisory Given   Pulmonary asthma , sleep apnea , COPD, former smoker,    Pulmonary exam normal breath sounds clear to auscultation       Cardiovascular hypertension, Pt. on medications  Rhythm:Regular Rate:Normal     Neuro/Psych Anxiety Depression negative neurological ROS     GI/Hepatic negative GI ROS, Neg liver ROS,   Endo/Other  Morbid obesity  Renal/GU negative Renal ROS  negative genitourinary   Musculoskeletal  (+) Arthritis , Osteoarthritis,    Abdominal   Peds  Hematology negative hematology ROS (+)   Anesthesia Other Findings   Reproductive/Obstetrics negative OB ROS                            Anesthesia Physical Anesthesia Plan  ASA: 3  Anesthesia Plan: General   Post-op Pain Management:    Induction: Intravenous  PONV Risk Score and Plan: 4 or greater and Ondansetron, Dexamethasone and Treatment may vary due to age or medical condition  Airway Management Planned: Oral ETT and Video Laryngoscope Planned  Additional Equipment:   Intra-op Plan:   Post-operative Plan: Extubation in OR  Informed Consent: I have reviewed the patients History and Physical, chart, labs and discussed the procedure including the risks, benefits and alternatives for the proposed anesthesia with the patient or authorized representative who has indicated his/her understanding and acceptance.     Dental advisory given  Plan Discussed with: CRNA  Anesthesia Plan Comments:         Anesthesia Quick Evaluation

## 2021-03-31 NOTE — Anesthesia Procedure Notes (Signed)
Procedure Name: Intubation Date/Time: 03/31/2021 2:03 PM Performed by: Roderic Palau, MD Pre-anesthesia Checklist: Patient identified, Emergency Drugs available, Suction available and Patient being monitored Patient Re-evaluated:Patient Re-evaluated prior to induction Oxygen Delivery Method: Circle system utilized Preoxygenation: Pre-oxygenation with 100% oxygen Induction Type: IV induction Ventilation: Two handed mask ventilation required and Mask ventilation with difficulty Laryngoscope Size: Glidescope and 4 Grade View: Grade I Tube type: Parker flex tip Tube size: 7.0 mm Number of attempts: 1 Airway Equipment and Method: Video-laryngoscopy and Stylet Placement Confirmation: ETT inserted through vocal cords under direct vision, positive ETCO2 and breath sounds checked- equal and bilateral Secured at: 22 cm Tube secured with: Tape Dental Injury: Teeth and Oropharynx as per pre-operative assessment  Difficulty Due To: Difficulty was anticipated, Difficult Airway- due to reduced neck mobility, Difficult Airway- due to anterior larynx and Difficult Airway- due to limited oral opening Future Recommendations: Recommend- induction with short-acting agent, and alternative techniques readily available Comments: Right tonsillar tissue partially blocking tracheal opening.

## 2021-04-01 LAB — SURGICAL PATHOLOGY

## 2021-04-03 NOTE — H&P (Signed)
Tammy Mccoy [id number]    Referring Provider:  Harlan Stains, MD     Subjective    Chief Complaint: New Patient and Cholelithiasis       History of Present Illness: 71 year old woman with history of morbid obesity hypertension, hyperlipidemia, COPD referred for evaluation of gallstones.  She has been experiencing abdominal pain in the right upper quadrant.  This is exacerbated by eating fried foods.  Lasts about 30 minutes, and she has had about 4-5 episodes.  The most recent episode was this past weekend and lasted for a couple of hours, and was very focally in the right subcostal region.  She had had [employer] for lunch that day but had baked chicken and wild rice for dinner before the pain set in. Ultrasound 12/22/2020 demonstrates gallstones up to 8 mm, no sonographic evidence of cholecystitis.  Common bile duct 4.8 mm.  Heterogenous echogenic liver likely related to diffuse hepatocellular disease including fatty infiltration.       Review of Systems: A complete review of systems was obtained from the patient.  I have reviewed this information and discussed as appropriate with the patient.  See HPI as well for other ROS.     Medical History:     Past Medical History:  Diagnosis Date   Anxiety     Arthritis     COPD (chronic obstructive pulmonary disease) (CMS-HCC)     Hypertension     Sleep apnea        There is no problem list on file for this patient.          Past Surgical History:  Procedure Laterality Date   carpal tunnel surgery N/A     csection surgery       parathyroid surgery               Allergies  Allergen Reactions   Bupropion Other (See Comments)      Worsened depression  Worsened depression      Ibuprofen Other (See Comments) and Itching      Lower lip swelling  Lower lip swelling      Duloxetine Other (See Comments)   Mirabegron Other (See Comments)   Oxybutynin Chloride Other (See Comments)   Rosuvastatin Other (See Comments)             Current Outpatient Medications on File Prior to Visit  Medication Sig Dispense Refill   ALPRAZolam (XANAX) 0.5 MG tablet alprazolam 0.5 mg tablet  TAKE 1 2 TO 1 (ONE HALF TO ONE) TABLET BY MOUTH AT BEDTIME AS NEEDED FOR SLEEP       FLUoxetine (PROZAC) 20 MG tablet 1 tablet (20 mg total)       naproxen (NAPROSYN) 500 MG tablet naproxen 500 mg tablet  TAKE 1 TABLET BY MOUTH TWICE DAILY       pravastatin (PRAVACHOL) 10 MG tablet Take 1 tablet (10 mg total) by mouth twice a week       triamterene-hydrochlorothiazide (MAXZIDE-25) 37.5-25 mg tablet Take 1 tablet by mouth every morning        No current facility-administered medications on file prior to visit.           Family History  Problem Relation Age of Onset   Obesity Sister     Breast cancer Sister        Social History        Tobacco Use  Smoking Status Former   Types: Cigarettes  Smokeless Tobacco Never  Social History         Socioeconomic History   Marital status: Married  Tobacco Use   Smoking status: Former      Types: Cigarettes   Smokeless tobacco: Never  Vaping Use   Vaping Use: Never used  Substance and Sexual Activity   Alcohol use: Never   Drug use: Never      Objective:         Vitals:    02/05/21 1028  BP: (!) 142/84  Pulse: 100  Temp: 36.4 C (97.6 F)  SpO2: 98%  Weight: (!) 135.4 kg (298 lb 6.4 oz)  Height: 165.1 cm (5\' 5" )    Body mass index is 49.66 kg/m.   Alert well-appearing Unlabored respirations Abdomen soft and nontender   Assessment and Plan:  Diagnoses and all orders for this visit:   Biliary colic -     [facility] Case Posting Request; Future     I recommend proceeding with laparoscopic or robotic cholecystectomy with possible cholangiogram.  Discussed the relevant anatomy using a diagram to demonstrate, and went over surgical technique.  Discussed risks of surgery including bleeding, pain, scarring, intraabdominal injury specifically to the common bile duct  and sequelae, bile leak, conversion to open surgery, failure to resolve symptoms, blood clots/ pulmonary embolus, heart attack, pneumonia, stroke, death.  Her risk of complications is increased by severe obesity and other comorbidities.  Questions welcomed and answered to patient's satisfaction.    Iara Monds Raquel James, MD

## 2021-04-23 DIAGNOSIS — H40013 Open angle with borderline findings, low risk, bilateral: Secondary | ICD-10-CM | POA: Diagnosis not present

## 2021-04-23 DIAGNOSIS — H43813 Vitreous degeneration, bilateral: Secondary | ICD-10-CM | POA: Diagnosis not present

## 2021-04-23 DIAGNOSIS — H43392 Other vitreous opacities, left eye: Secondary | ICD-10-CM | POA: Diagnosis not present

## 2021-04-23 DIAGNOSIS — H43822 Vitreomacular adhesion, left eye: Secondary | ICD-10-CM | POA: Diagnosis not present

## 2021-05-18 DIAGNOSIS — Z780 Asymptomatic menopausal state: Secondary | ICD-10-CM | POA: Diagnosis not present

## 2021-05-18 DIAGNOSIS — Z1231 Encounter for screening mammogram for malignant neoplasm of breast: Secondary | ICD-10-CM | POA: Diagnosis not present

## 2021-05-28 DIAGNOSIS — L821 Other seborrheic keratosis: Secondary | ICD-10-CM | POA: Diagnosis not present

## 2021-05-28 DIAGNOSIS — L820 Inflamed seborrheic keratosis: Secondary | ICD-10-CM | POA: Diagnosis not present

## 2021-05-28 DIAGNOSIS — D1801 Hemangioma of skin and subcutaneous tissue: Secondary | ICD-10-CM | POA: Diagnosis not present

## 2021-05-28 DIAGNOSIS — Z85828 Personal history of other malignant neoplasm of skin: Secondary | ICD-10-CM | POA: Diagnosis not present

## 2021-05-28 DIAGNOSIS — L718 Other rosacea: Secondary | ICD-10-CM | POA: Diagnosis not present

## 2021-05-28 DIAGNOSIS — D2272 Melanocytic nevi of left lower limb, including hip: Secondary | ICD-10-CM | POA: Diagnosis not present

## 2021-05-28 DIAGNOSIS — L732 Hidradenitis suppurativa: Secondary | ICD-10-CM | POA: Diagnosis not present

## 2021-05-28 DIAGNOSIS — L738 Other specified follicular disorders: Secondary | ICD-10-CM | POA: Diagnosis not present

## 2021-05-28 DIAGNOSIS — L814 Other melanin hyperpigmentation: Secondary | ICD-10-CM | POA: Diagnosis not present

## 2021-06-01 DIAGNOSIS — R7303 Prediabetes: Secondary | ICD-10-CM | POA: Diagnosis not present

## 2021-06-01 DIAGNOSIS — E042 Nontoxic multinodular goiter: Secondary | ICD-10-CM | POA: Diagnosis not present

## 2021-06-01 DIAGNOSIS — Z808 Family history of malignant neoplasm of other organs or systems: Secondary | ICD-10-CM | POA: Diagnosis not present

## 2021-06-01 DIAGNOSIS — Z8639 Personal history of other endocrine, nutritional and metabolic disease: Secondary | ICD-10-CM | POA: Diagnosis not present

## 2021-06-01 DIAGNOSIS — Z8349 Family history of other endocrine, nutritional and metabolic diseases: Secondary | ICD-10-CM | POA: Diagnosis not present

## 2021-06-12 ENCOUNTER — Ambulatory Visit
Admission: RE | Admit: 2021-06-12 | Discharge: 2021-06-12 | Disposition: A | Discharge status: Home / Self Care | Payer: PPO | Source: Ambulatory Visit | Attending: Acute Care | Admitting: Acute Care

## 2021-06-12 DIAGNOSIS — Z87891 Personal history of nicotine dependence: Secondary | ICD-10-CM | POA: Diagnosis not present

## 2021-06-15 ENCOUNTER — Other Ambulatory Visit: Payer: Self-pay | Admitting: Acute Care

## 2021-06-15 DIAGNOSIS — Z87891 Personal history of nicotine dependence: Secondary | ICD-10-CM

## 2021-06-15 DIAGNOSIS — Z122 Encounter for screening for malignant neoplasm of respiratory organs: Secondary | ICD-10-CM

## 2021-09-08 DIAGNOSIS — G4730 Sleep apnea, unspecified: Secondary | ICD-10-CM | POA: Diagnosis not present

## 2021-09-08 DIAGNOSIS — J449 Chronic obstructive pulmonary disease, unspecified: Secondary | ICD-10-CM | POA: Diagnosis not present

## 2021-09-08 DIAGNOSIS — I10 Essential (primary) hypertension: Secondary | ICD-10-CM | POA: Diagnosis not present

## 2021-09-08 DIAGNOSIS — I700 Atherosclerosis of aorta: Secondary | ICD-10-CM | POA: Diagnosis not present

## 2021-09-08 DIAGNOSIS — R7303 Prediabetes: Secondary | ICD-10-CM | POA: Diagnosis not present

## 2021-09-08 DIAGNOSIS — E042 Nontoxic multinodular goiter: Secondary | ICD-10-CM | POA: Diagnosis not present

## 2021-09-08 DIAGNOSIS — F331 Major depressive disorder, recurrent, moderate: Secondary | ICD-10-CM | POA: Diagnosis not present

## 2021-10-01 DIAGNOSIS — L821 Other seborrheic keratosis: Secondary | ICD-10-CM | POA: Diagnosis not present

## 2021-10-01 DIAGNOSIS — B029 Zoster without complications: Secondary | ICD-10-CM | POA: Diagnosis not present

## 2021-10-01 DIAGNOSIS — Z85828 Personal history of other malignant neoplasm of skin: Secondary | ICD-10-CM | POA: Diagnosis not present

## 2021-10-07 ENCOUNTER — Encounter (INDEPENDENT_AMBULATORY_CARE_PROVIDER_SITE_OTHER): Payer: Self-pay

## 2021-10-18 DIAGNOSIS — J208 Acute bronchitis due to other specified organisms: Secondary | ICD-10-CM | POA: Diagnosis not present

## 2021-10-26 DIAGNOSIS — J449 Chronic obstructive pulmonary disease, unspecified: Secondary | ICD-10-CM | POA: Diagnosis not present

## 2021-10-26 DIAGNOSIS — G4733 Obstructive sleep apnea (adult) (pediatric): Secondary | ICD-10-CM | POA: Diagnosis not present

## 2021-10-26 DIAGNOSIS — R7303 Prediabetes: Secondary | ICD-10-CM | POA: Diagnosis not present

## 2021-10-28 DIAGNOSIS — H2512 Age-related nuclear cataract, left eye: Secondary | ICD-10-CM | POA: Diagnosis not present

## 2021-10-28 DIAGNOSIS — H43392 Other vitreous opacities, left eye: Secondary | ICD-10-CM | POA: Diagnosis not present

## 2021-10-28 DIAGNOSIS — H25811 Combined forms of age-related cataract, right eye: Secondary | ICD-10-CM | POA: Diagnosis not present

## 2021-10-28 DIAGNOSIS — H35342 Macular cyst, hole, or pseudohole, left eye: Secondary | ICD-10-CM | POA: Diagnosis not present

## 2021-10-28 DIAGNOSIS — H40013 Open angle with borderline findings, low risk, bilateral: Secondary | ICD-10-CM | POA: Diagnosis not present

## 2021-12-10 DIAGNOSIS — G4700 Insomnia, unspecified: Secondary | ICD-10-CM | POA: Diagnosis not present

## 2021-12-10 DIAGNOSIS — F331 Major depressive disorder, recurrent, moderate: Secondary | ICD-10-CM | POA: Diagnosis not present

## 2021-12-10 DIAGNOSIS — J309 Allergic rhinitis, unspecified: Secondary | ICD-10-CM | POA: Diagnosis not present

## 2021-12-10 DIAGNOSIS — G4733 Obstructive sleep apnea (adult) (pediatric): Secondary | ICD-10-CM | POA: Diagnosis not present

## 2021-12-10 DIAGNOSIS — Z23 Encounter for immunization: Secondary | ICD-10-CM | POA: Diagnosis not present

## 2021-12-14 DIAGNOSIS — Z1331 Encounter for screening for depression: Secondary | ICD-10-CM | POA: Diagnosis not present

## 2021-12-14 DIAGNOSIS — I10 Essential (primary) hypertension: Secondary | ICD-10-CM | POA: Diagnosis not present

## 2021-12-14 DIAGNOSIS — J449 Chronic obstructive pulmonary disease, unspecified: Secondary | ICD-10-CM | POA: Diagnosis not present

## 2021-12-14 DIAGNOSIS — Z6841 Body Mass Index (BMI) 40.0 and over, adult: Secondary | ICD-10-CM | POA: Diagnosis not present

## 2021-12-14 DIAGNOSIS — R7303 Prediabetes: Secondary | ICD-10-CM | POA: Diagnosis not present

## 2021-12-28 DIAGNOSIS — R7303 Prediabetes: Secondary | ICD-10-CM | POA: Diagnosis not present

## 2021-12-28 DIAGNOSIS — I10 Essential (primary) hypertension: Secondary | ICD-10-CM | POA: Diagnosis not present

## 2021-12-28 DIAGNOSIS — M170 Bilateral primary osteoarthritis of knee: Secondary | ICD-10-CM | POA: Diagnosis not present

## 2021-12-28 DIAGNOSIS — Z6841 Body Mass Index (BMI) 40.0 and over, adult: Secondary | ICD-10-CM | POA: Diagnosis not present

## 2021-12-30 DIAGNOSIS — M170 Bilateral primary osteoarthritis of knee: Secondary | ICD-10-CM | POA: Diagnosis not present

## 2021-12-30 DIAGNOSIS — M1712 Unilateral primary osteoarthritis, left knee: Secondary | ICD-10-CM | POA: Diagnosis not present

## 2021-12-30 DIAGNOSIS — M1711 Unilateral primary osteoarthritis, right knee: Secondary | ICD-10-CM | POA: Diagnosis not present

## 2022-01-11 DIAGNOSIS — I10 Essential (primary) hypertension: Secondary | ICD-10-CM | POA: Diagnosis not present

## 2022-01-11 DIAGNOSIS — R7303 Prediabetes: Secondary | ICD-10-CM | POA: Diagnosis not present

## 2022-01-11 DIAGNOSIS — Z6841 Body Mass Index (BMI) 40.0 and over, adult: Secondary | ICD-10-CM | POA: Diagnosis not present

## 2022-02-01 DIAGNOSIS — Z85828 Personal history of other malignant neoplasm of skin: Secondary | ICD-10-CM | POA: Diagnosis not present

## 2022-02-01 DIAGNOSIS — C44622 Squamous cell carcinoma of skin of right upper limb, including shoulder: Secondary | ICD-10-CM | POA: Diagnosis not present

## 2022-02-03 DIAGNOSIS — Z6841 Body Mass Index (BMI) 40.0 and over, adult: Secondary | ICD-10-CM | POA: Diagnosis not present

## 2022-02-03 DIAGNOSIS — I10 Essential (primary) hypertension: Secondary | ICD-10-CM | POA: Diagnosis not present

## 2022-02-03 DIAGNOSIS — R7303 Prediabetes: Secondary | ICD-10-CM | POA: Diagnosis not present

## 2022-03-08 DIAGNOSIS — I10 Essential (primary) hypertension: Secondary | ICD-10-CM | POA: Diagnosis not present

## 2022-03-08 DIAGNOSIS — R7303 Prediabetes: Secondary | ICD-10-CM | POA: Diagnosis not present

## 2022-03-08 DIAGNOSIS — Z6841 Body Mass Index (BMI) 40.0 and over, adult: Secondary | ICD-10-CM | POA: Diagnosis not present

## 2022-03-18 DIAGNOSIS — R635 Abnormal weight gain: Secondary | ICD-10-CM | POA: Diagnosis not present

## 2022-03-18 DIAGNOSIS — Z86010 Personal history of colonic polyps: Secondary | ICD-10-CM | POA: Diagnosis not present

## 2022-03-18 DIAGNOSIS — K5730 Diverticulosis of large intestine without perforation or abscess without bleeding: Secondary | ICD-10-CM | POA: Diagnosis not present

## 2022-03-18 DIAGNOSIS — K9089 Other intestinal malabsorption: Secondary | ICD-10-CM | POA: Diagnosis not present

## 2022-03-29 DIAGNOSIS — U071 COVID-19: Secondary | ICD-10-CM | POA: Diagnosis not present

## 2022-03-29 DIAGNOSIS — J449 Chronic obstructive pulmonary disease, unspecified: Secondary | ICD-10-CM | POA: Diagnosis not present

## 2022-03-31 DIAGNOSIS — I10 Essential (primary) hypertension: Secondary | ICD-10-CM | POA: Diagnosis not present

## 2022-03-31 DIAGNOSIS — Z0000 Encounter for general adult medical examination without abnormal findings: Secondary | ICD-10-CM | POA: Diagnosis not present

## 2022-03-31 DIAGNOSIS — F331 Major depressive disorder, recurrent, moderate: Secondary | ICD-10-CM | POA: Diagnosis not present

## 2022-03-31 DIAGNOSIS — I700 Atherosclerosis of aorta: Secondary | ICD-10-CM | POA: Diagnosis not present

## 2022-03-31 DIAGNOSIS — E559 Vitamin D deficiency, unspecified: Secondary | ICD-10-CM | POA: Diagnosis not present

## 2022-03-31 DIAGNOSIS — J309 Allergic rhinitis, unspecified: Secondary | ICD-10-CM | POA: Diagnosis not present

## 2022-03-31 DIAGNOSIS — N3281 Overactive bladder: Secondary | ICD-10-CM | POA: Diagnosis not present

## 2022-03-31 DIAGNOSIS — G4733 Obstructive sleep apnea (adult) (pediatric): Secondary | ICD-10-CM | POA: Diagnosis not present

## 2022-03-31 DIAGNOSIS — G4700 Insomnia, unspecified: Secondary | ICD-10-CM | POA: Diagnosis not present

## 2022-03-31 DIAGNOSIS — J449 Chronic obstructive pulmonary disease, unspecified: Secondary | ICD-10-CM | POA: Diagnosis not present

## 2022-03-31 DIAGNOSIS — E042 Nontoxic multinodular goiter: Secondary | ICD-10-CM | POA: Diagnosis not present

## 2022-05-24 DIAGNOSIS — Z1231 Encounter for screening mammogram for malignant neoplasm of breast: Secondary | ICD-10-CM | POA: Diagnosis not present

## 2022-05-27 DIAGNOSIS — R8290 Unspecified abnormal findings in urine: Secondary | ICD-10-CM | POA: Diagnosis not present

## 2022-05-27 DIAGNOSIS — Z6841 Body Mass Index (BMI) 40.0 and over, adult: Secondary | ICD-10-CM | POA: Diagnosis not present

## 2022-05-27 DIAGNOSIS — S30814A Abrasion of vagina and vulva, initial encounter: Secondary | ICD-10-CM | POA: Diagnosis not present

## 2022-06-03 DIAGNOSIS — E042 Nontoxic multinodular goiter: Secondary | ICD-10-CM | POA: Diagnosis not present

## 2022-06-03 DIAGNOSIS — Z808 Family history of malignant neoplasm of other organs or systems: Secondary | ICD-10-CM | POA: Diagnosis not present

## 2022-06-03 DIAGNOSIS — R7303 Prediabetes: Secondary | ICD-10-CM | POA: Diagnosis not present

## 2022-06-03 DIAGNOSIS — Z6841 Body Mass Index (BMI) 40.0 and over, adult: Secondary | ICD-10-CM | POA: Diagnosis not present

## 2022-06-03 DIAGNOSIS — Z8349 Family history of other endocrine, nutritional and metabolic diseases: Secondary | ICD-10-CM | POA: Diagnosis not present

## 2022-06-03 DIAGNOSIS — Z8639 Personal history of other endocrine, nutritional and metabolic disease: Secondary | ICD-10-CM | POA: Diagnosis not present

## 2022-06-14 ENCOUNTER — Other Ambulatory Visit: Payer: Self-pay | Admitting: Acute Care

## 2022-06-14 DIAGNOSIS — Z87891 Personal history of nicotine dependence: Secondary | ICD-10-CM

## 2022-06-14 DIAGNOSIS — Z122 Encounter for screening for malignant neoplasm of respiratory organs: Secondary | ICD-10-CM

## 2022-06-15 ENCOUNTER — Other Ambulatory Visit: Payer: PPO

## 2022-06-23 DIAGNOSIS — N8189 Other female genital prolapse: Secondary | ICD-10-CM | POA: Diagnosis not present

## 2022-06-23 DIAGNOSIS — N950 Postmenopausal bleeding: Secondary | ICD-10-CM | POA: Diagnosis not present

## 2022-06-23 DIAGNOSIS — N898 Other specified noninflammatory disorders of vagina: Secondary | ICD-10-CM | POA: Diagnosis not present

## 2022-06-23 DIAGNOSIS — N9089 Other specified noninflammatory disorders of vulva and perineum: Secondary | ICD-10-CM | POA: Diagnosis not present

## 2022-06-23 DIAGNOSIS — Z124 Encounter for screening for malignant neoplasm of cervix: Secondary | ICD-10-CM | POA: Diagnosis not present

## 2022-07-02 ENCOUNTER — Ambulatory Visit
Admission: RE | Admit: 2022-07-02 | Discharge: 2022-07-02 | Disposition: A | Discharge status: Home / Self Care | Payer: PPO | Source: Ambulatory Visit | Attending: Acute Care | Admitting: Acute Care

## 2022-07-02 DIAGNOSIS — Z122 Encounter for screening for malignant neoplasm of respiratory organs: Secondary | ICD-10-CM | POA: Diagnosis not present

## 2022-07-02 DIAGNOSIS — Z87891 Personal history of nicotine dependence: Secondary | ICD-10-CM

## 2022-07-05 DIAGNOSIS — H2512 Age-related nuclear cataract, left eye: Secondary | ICD-10-CM | POA: Diagnosis not present

## 2022-07-05 DIAGNOSIS — H524 Presbyopia: Secondary | ICD-10-CM | POA: Diagnosis not present

## 2022-07-05 DIAGNOSIS — H52223 Regular astigmatism, bilateral: Secondary | ICD-10-CM | POA: Diagnosis not present

## 2022-07-05 DIAGNOSIS — H25811 Combined forms of age-related cataract, right eye: Secondary | ICD-10-CM | POA: Diagnosis not present

## 2022-07-05 DIAGNOSIS — H5213 Myopia, bilateral: Secondary | ICD-10-CM | POA: Diagnosis not present

## 2022-07-05 DIAGNOSIS — H04123 Dry eye syndrome of bilateral lacrimal glands: Secondary | ICD-10-CM | POA: Diagnosis not present

## 2022-07-05 DIAGNOSIS — H35342 Macular cyst, hole, or pseudohole, left eye: Secondary | ICD-10-CM | POA: Diagnosis not present

## 2022-07-07 ENCOUNTER — Other Ambulatory Visit: Payer: Self-pay

## 2022-07-07 DIAGNOSIS — Z87891 Personal history of nicotine dependence: Secondary | ICD-10-CM

## 2022-07-07 DIAGNOSIS — Z122 Encounter for screening for malignant neoplasm of respiratory organs: Secondary | ICD-10-CM

## 2022-07-14 DIAGNOSIS — A6004 Herpesviral vulvovaginitis: Secondary | ICD-10-CM | POA: Diagnosis not present

## 2022-07-14 DIAGNOSIS — N950 Postmenopausal bleeding: Secondary | ICD-10-CM | POA: Diagnosis not present

## 2022-07-19 DIAGNOSIS — R208 Other disturbances of skin sensation: Secondary | ICD-10-CM | POA: Diagnosis not present

## 2022-07-19 DIAGNOSIS — D692 Other nonthrombocytopenic purpura: Secondary | ICD-10-CM | POA: Diagnosis not present

## 2022-07-19 DIAGNOSIS — L821 Other seborrheic keratosis: Secondary | ICD-10-CM | POA: Diagnosis not present

## 2022-07-19 DIAGNOSIS — D2261 Melanocytic nevi of right upper limb, including shoulder: Secondary | ICD-10-CM | POA: Diagnosis not present

## 2022-07-19 DIAGNOSIS — L304 Erythema intertrigo: Secondary | ICD-10-CM | POA: Diagnosis not present

## 2022-07-19 DIAGNOSIS — D1801 Hemangioma of skin and subcutaneous tissue: Secondary | ICD-10-CM | POA: Diagnosis not present

## 2022-07-19 DIAGNOSIS — Z85828 Personal history of other malignant neoplasm of skin: Secondary | ICD-10-CM | POA: Diagnosis not present

## 2022-08-03 ENCOUNTER — Other Ambulatory Visit: Payer: Self-pay | Admitting: Obstetrics and Gynecology

## 2022-08-03 DIAGNOSIS — N907 Vulvar cyst: Secondary | ICD-10-CM | POA: Diagnosis not present

## 2022-08-03 DIAGNOSIS — N901 Moderate vulvar dysplasia: Secondary | ICD-10-CM | POA: Diagnosis not present

## 2022-08-03 DIAGNOSIS — N950 Postmenopausal bleeding: Secondary | ICD-10-CM | POA: Diagnosis not present

## 2022-08-05 DIAGNOSIS — K5730 Diverticulosis of large intestine without perforation or abscess without bleeding: Secondary | ICD-10-CM | POA: Diagnosis not present

## 2022-08-05 DIAGNOSIS — K5732 Diverticulitis of large intestine without perforation or abscess without bleeding: Secondary | ICD-10-CM | POA: Diagnosis not present

## 2022-08-05 DIAGNOSIS — Z86010 Personal history of colonic polyps: Secondary | ICD-10-CM | POA: Diagnosis not present

## 2022-08-16 DIAGNOSIS — N901 Moderate vulvar dysplasia: Secondary | ICD-10-CM | POA: Diagnosis not present

## 2022-09-09 DIAGNOSIS — H10823 Rosacea conjunctivitis, bilateral: Secondary | ICD-10-CM | POA: Diagnosis not present

## 2022-09-13 DIAGNOSIS — I700 Atherosclerosis of aorta: Secondary | ICD-10-CM | POA: Diagnosis not present

## 2022-09-13 DIAGNOSIS — R7309 Other abnormal glucose: Secondary | ICD-10-CM | POA: Diagnosis not present

## 2022-09-13 DIAGNOSIS — Z6841 Body Mass Index (BMI) 40.0 and over, adult: Secondary | ICD-10-CM | POA: Diagnosis not present

## 2022-09-13 DIAGNOSIS — R7303 Prediabetes: Secondary | ICD-10-CM | POA: Diagnosis not present

## 2022-09-13 DIAGNOSIS — F331 Major depressive disorder, recurrent, moderate: Secondary | ICD-10-CM | POA: Diagnosis not present

## 2022-09-13 DIAGNOSIS — E559 Vitamin D deficiency, unspecified: Secondary | ICD-10-CM | POA: Diagnosis not present

## 2022-09-13 DIAGNOSIS — I10 Essential (primary) hypertension: Secondary | ICD-10-CM | POA: Diagnosis not present

## 2022-09-13 DIAGNOSIS — G4700 Insomnia, unspecified: Secondary | ICD-10-CM | POA: Diagnosis not present

## 2022-09-13 DIAGNOSIS — J449 Chronic obstructive pulmonary disease, unspecified: Secondary | ICD-10-CM | POA: Diagnosis not present

## 2022-09-22 DIAGNOSIS — H10823 Rosacea conjunctivitis, bilateral: Secondary | ICD-10-CM | POA: Diagnosis not present

## 2022-09-22 DIAGNOSIS — H04123 Dry eye syndrome of bilateral lacrimal glands: Secondary | ICD-10-CM | POA: Diagnosis not present

## 2022-12-22 ENCOUNTER — Encounter (HOSPITAL_BASED_OUTPATIENT_CLINIC_OR_DEPARTMENT_OTHER): Payer: Self-pay

## 2022-12-22 ENCOUNTER — Ambulatory Visit (HOSPITAL_BASED_OUTPATIENT_CLINIC_OR_DEPARTMENT_OTHER): Admit: 2022-12-22 | Payer: PPO | Admitting: Obstetrics and Gynecology

## 2022-12-22 SURGERY — VULVECTOMY
Anesthesia: Choice

## 2023-01-20 DIAGNOSIS — M1712 Unilateral primary osteoarthritis, left knee: Secondary | ICD-10-CM | POA: Diagnosis not present

## 2023-01-20 DIAGNOSIS — M1711 Unilateral primary osteoarthritis, right knee: Secondary | ICD-10-CM | POA: Diagnosis not present

## 2023-02-01 DIAGNOSIS — N9089 Other specified noninflammatory disorders of vulva and perineum: Secondary | ICD-10-CM | POA: Diagnosis not present

## 2023-02-01 DIAGNOSIS — N901 Moderate vulvar dysplasia: Secondary | ICD-10-CM | POA: Diagnosis not present

## 2023-02-14 ENCOUNTER — Other Ambulatory Visit: Payer: Self-pay | Admitting: Obstetrics and Gynecology

## 2023-02-14 ENCOUNTER — Encounter (HOSPITAL_COMMUNITY): Payer: Self-pay | Admitting: Obstetrics and Gynecology

## 2023-02-14 ENCOUNTER — Other Ambulatory Visit: Payer: Self-pay

## 2023-02-14 DIAGNOSIS — N901 Moderate vulvar dysplasia: Secondary | ICD-10-CM

## 2023-02-14 NOTE — Progress Notes (Addendum)
PCP - Dr. Laurann Montana Cardiologist - denies Endocrinologist- Dr. Talmage Coin  PPM/ICD - denies   Chest x-ray - 08/29/13 EKG - DOS Stress Test - 05/08/13 ECHO - denies Cardiac Cath - denies  CPAP - denies  DM- Pt states she does not check CBG at home, and does not know typical  fasting levels  ASA/Blood Thinner Instructions: n/a   ERAS Protcol - clears until 0530  COVID TEST- n/a  Anesthesia review: yes, difficult airway flag. Pt states she was told there was no difficulty when she had her cholecystectomy  Patient verbally denies any shortness of breath, fever, cough and chest pain during phone call   -------------  SDW INSTRUCTIONS given:  Your procedure is scheduled on 12/18.  Report to [facility] Main Entrance "A" at 0600 A.M., and check in at the Admitting office.  Call this number if you have problems the morning of surgery:  [phone]   Remember:  Do not eat after midnight the night before your surgery  You may drink clear liquids until 0530 AM the morning of your surgery.   Clear liquids allowed are: Water, Non-Citrus Juices (without pulp), Carbonated Beverages, Clear Tea, Black Coffee Only, and Gatorade    Take these medicines the morning of surgery with A SIP OF WATER  Prozac Pravachol PRN: Flonase, Anora Ellipta, Tylenol, Xanax  As of today, STOP taking any Aspirin (unless otherwise instructed by your surgeon) Aleve, Naproxen, Ibuprofen, Motrin, Advil, Goody's, BC's, all herbal medications, fish oil, and all vitamins.  WHAT DO I DO ABOUT MY DIABETES MEDICATION?   Do not take METFORMIN the morning of surgery.    HOW TO MANAGE YOUR DIABETES BEFORE AND AFTER SURGERY  Why is it important to control my blood sugar before and after surgery? Improving blood sugar levels before and after surgery helps healing and can limit problems. A way of improving blood sugar control is eating a healthy diet by:  Eating less sugar and carbohydrates  Increasing  activity/exercise  Talking with your doctor about reaching your blood sugar goals High blood sugars (greater than 180 mg/dL) can raise your risk of infections and slow your recovery, so you will need to focus on controlling your diabetes during the weeks before surgery. Make sure that the doctor who takes care of your diabetes knows about your planned surgery including the date and location.  How do I manage my blood sugar before surgery? Check your blood sugar at least 4 times a day, starting 2 days before surgery, to make sure that the level is not too high or low.  Check your blood sugar the morning of your surgery when you wake up and every 2 hours until you get to the Short Stay unit.  If your blood sugar is less than 70 mg/dL, you will need to treat for low blood sugar: Do not take insulin. Treat a low blood sugar (less than 70 mg/dL) with  cup of clear juice (cranberry or apple), 4 glucose tablets, OR glucose gel. Recheck blood sugar in 15 minutes after treatment (to make sure it is greater than 70 mg/dL). If your blood sugar is not greater than 70 mg/dL on recheck, call [phone] for further instructions. Report your blood sugar to the short stay nurse when you get to Short Stay.  If you are admitted to the hospital after surgery: Your blood sugar will be checked by the staff and you will probably be given insulin after surgery (instead of oral diabetes medicines) to make  sure you have good blood sugar levels. The goal for blood sugar control after surgery is 80-180 mg/dL.                      Do not wear jewelry, make up, or nail polish            Do not wear lotions, powders, perfumes/colognes, or deodorant.            Do not shave 48 hours prior to surgery.  Men may shave face and neck.            Do not bring valuables to the hospital.            [facility] is not responsible for any belongings or valuables.  Do NOT Smoke (Tobacco/Vaping) 24 hours prior to your  procedure If you use a CPAP at night, you may bring all equipment for your overnight stay.   Contacts, glasses, dentures or bridgework may not be worn into surgery.      For patients admitted to the hospital, discharge time will be determined by your treatment team.   Patients discharged the day of surgery will not be allowed to drive home, and someone needs to stay with them for 24 hours.    Special instructions:   [facility]- Preparing For Surgery  Before surgery, you can play an important role. Because skin is not sterile, your skin needs to be as free of germs as possible. You can reduce the number of germs on your skin by washing with CHG (chlorahexidine gluconate) Soap before surgery.  CHG is an antiseptic cleaner which kills germs and bonds with the skin to continue killing germs even after washing.    Oral Hygiene is also important to reduce your risk of infection.  Remember - BRUSH YOUR TEETH THE MORNING OF SURGERY WITH YOUR REGULAR TOOTHPASTE  Please do not use if you have an allergy to CHG or antibacterial soaps. If your skin becomes reddened/irritated stop using the CHG.  Do not shave (including legs and underarms) for at least 48 hours prior to first CHG shower. It is OK to shave your face.  Please follow these instructions carefully.   Shower the NIGHT BEFORE SURGERY and the MORNING OF SURGERY with DIAL Soap.   Pat yourself dry with a CLEAN TOWEL.  Wear CLEAN PAJAMAS to bed the night before surgery  Place CLEAN SHEETS on your bed the night of your first shower and DO NOT SLEEP WITH PETS.   Day of Surgery: Please shower morning of surgery  Wear Clean/Comfortable clothing the morning of surgery Do not apply any deodorants/lotions.   Remember to brush your teeth WITH YOUR REGULAR TOOTHPASTE.   Questions were answered. Patient verbalized understanding of instructions.

## 2023-02-14 NOTE — H&P (Signed)
Reason for Appointment   1.  VIN II (vulvar intraepithelial neoplasia II) History of Present Illness    General:  72 y/o presents for preop visit. Pt is schedule for a wide local exicsion on 02/16/2023 for the management of VIN II and vulvar lesion.   Biopsy was performed on August 03, 2022 and reveled VIN 2.  Per patient the biospy site has healed  Visit on 08/03/2022, patietnt reported a cyst on her vulva and she thinks the bleeding may have been from a cyst on her vulva and not  from her vagina. --- GYN ULTRASOUND FINDINGS ON 07/14/2022: Uterus 8.60x4.17x4.87cm. Uterus volume 91.62ml. Uterine fibroids-1) Post LUS Cs 4.8x4.3x3.6cm( with calcification) 2) Post LUS CX 2.4x2.4x2.3cm( with calcifications) Endometrium difficult to clearly visualize and evaluate-visuazlized portion appears wnl. Endo; 0.38ml. Lt OV wnl. Lt OV not visualized No adnexal masses seen.  Current Medications Taking ALPRAZolam 0.5 MG Tablet 1/2 to 1 tablet Orally at bedtime as needed for sleep FLUoxetine HCl 10 MG Capsule TAKE 4 CAPSULES BY MOUTH ONCE DAILY metFORMIN HCl ER 500 MG Tablet Extended Release 24 Hour 1 tablet with evening meal Orally Once a day Pravastatin Sodium 10 MG Tablet 1 tablet Orally twice a week Triamterene-HCTZ 37.5-25 MG Tablet 1/2 tablet in the morning Orally Once a day valACYclovir HCl 500 MG Tablet 1 tablet Orally twice a day Naproxen 500 MG Tablet take 1 tablet by mouth twice a day if needed for pain Clobetasol Propionate 0.05 % Ointment 1 application Externally once a day As needed Fexofenadine HCl 180 MG Tablet 1 tablet Orally Once a day Flonase(Fluticasone Propionate) 50 MCG/ACT Suspension 2 sprays each nostril Nasally once a day if needed Vitamin B12 1000 MCG Tablet Extended Release 1 tablet Orally Once a day Vitamin D 3 1000units 1 pill orally daily Multivitamin Tablet by mouth daily Tylenol Arthritis Pain , Notes to Pharmacist: as needed Discontinued Amoxicillin-Pot Clavulanate 875-125 MG  Tablet 1 tablet as needed for diverticulitis Orally every 12 hrs Medication List reviewed and reconciled with the patient Past Medical History Multinodular goiter--pt had bx at [facility] mid summer 2010, Dr Sharl Ma. Rosacea, Dr Lomax-->Dr Roderic Scarce. Depression/anxiety. Vitamin D deficiency. Obesity. Sleep apnea (intolerent of CPAP), mild, per Dr Shelle Iron 6/15, will work on weight loss first, 2022 Dr Betti Cruz, severe sleep apnea, intolerant of CPAP. Dr Annye Rusk cell and basal cell skin cancers-->Dr Roderic Scarce. tubular adenomatous polyp, 11/16, Dr Loreta Ave, 2/20, 5 years. aortic atherosclerosis on CT of lungs 8/19, 4/23. emphysema on CT of lungs 8/19, 4/23. fatty liver on CT of lungs 10/2018, and an ultrasound 10/22. Prediabetes. lumbar DDD, Dr Ethelene Hal. OA knees, Dr Aundria Rud. rosacea of the eyes, Dr Hanley Ben. Surgical History cone biopsy may have had issues related to IUD 1978? 2 C-Sections 80, 89 Carpal Tunnel left wrist 88 carpal tunnel release (right) 2011/09 tonsillectomy appy (incidental with C/S) BTL D&C 06 parathyroidectomy, dr Gerrit Friends 4/18 laser vein surgery 9/18 Cholecystectomy--Dr Conner 1/23 Family History Father: deceased 17 yrs, Bladder Cancer, high cholesterol, diagnosed with CVA Mother: deceased 36 yrs, Differentiated thyroid cancer in her 67, lung cancer, had a cancer in her neck, treatment made COPE worse, high cholesterol, diagnosed with COPD (chronic obstructive pulmonary disease) Paternal Grand Father: deceased, dementia Paternal Grand Mother: deceased, Breast Cancer, diagnosed with Breast cancer Maternal Grand Father: deceased, Unknown Maternal Grand Mother: deceased, Diabetes Mellitus, diagnosed with Diabetes Sister 1: alive, Differentiated thyroid cancer (follicular thyroid carcinoma and papillary thyroid carcinoma) in her late 11s, breast cancer, diagnosed with Breast cancer Sister 2: deceased, Hypothyroidism,,  sepsis 2 sister(s) . Niece - differentiated thyroid cancer in her  80s (the daughter of the sister with thyroid cancer). Social History    General:  Tobacco use  cigarettes:  Former smoker, Quit in year  2010, Pack-year Hx:  22, Tobacco history last updated  02/01/2023, Additional Findings: Tobacco Non-User  Ex-cigarette smoker, Vaping  No. EXPOSURE TO PASSIVE SMOKE: no, quit 1/10. Alcohol: no, previously heavy, quit 2006. Caffeine: yes, coffee, 1 serving daily. Recreational drug use: no. Exercise: active with grandkids, no formal exercise. Marital Status: widowed 4/14 Sherrine Maples. Children: 2 Ladona Ridgel (recovering drug addict--[place]), Johnell Comings), three grandsons [place], Marlene Bast with Marylu Lund, [place] with Ladona Ridgel. OCCUPATION: retired. 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 Seat belt use: yes. Tobacco Exposure: no. Gyn History Sexual activity not currently sexually active x past 15 years.  Periods : postmenopausal.  Denies H/O LMP PMB.  Denies H/O Birth control.  Last pap smear date 05/2022 - NILM/HPV neg.  Last mammogram date 04/2022 -BR2: Benign.  H/O Abnormal pap smear early 1980s, treated with [facility]C.  Denies H/O STD.  Miscellaneous high risk gyn medicare category for pelvic/breast exam; number of lifetime sexual partners.  OB History Number of pregnancies  2.  Pregnancy # 1  live birth, C-section delivery.  Pregnancy # 2  live birth, C-section.  Allergies Ibuprofen: hands/lips swell/itchy - Allergy Myrbetriq: SOB and swelling in feet - Allergy Ditropan XL: not effective - Lack of Therapeutic Effect Wellbutrin XL: more depressed - Side Effects Duloxetine: more depressed at high dose - Side Effects Rosuvastatin: leg pain - Side Effects Trintellix: more anxous - Side Effects Ozempic (0.25 or 0.5 MG/DOSE): diarrhea - Side Effects Hospitalization/Major Diagnostic Procedure surgeries childbirth x2 Review of Systems    CONSTITUTIONAL:  Chills No.  Fatigue No.  Fever No.  Night sweats No.  Recent travel outside [place] No.  Sweats No.  Weight change No.      OPHTHALMOLOGY:  Blurring of vision no.  Change in vision no.  Double vision no.     ENT:  Dizziness no.  Nose bleeds no.  Sore throat no.  Teeth pain no.     ALLERGY:  Hives no.     CARDIOLOGY:  Chest pain no.  High blood pressure no.  Irregular heart beat no.  Leg edema no.  Palpitations no.     RESPIRATORY:  Shortness of breath no.  Cough no.  Wheezing no.     UROLOGY:  Pain with urination no.  Urinary urgency no.  Urinary frequency no.  Urinary incontinence no.  Difficulty urinating No.  Blood in urine No.     GASTROENTEROLOGY:  Abdominal pain no.  Appetite change no.  Bloating/belching no.  Blood in stool or on toilet paper no.  Change in bowel movements no.  Constipation no.  Diarrhea no.  Difficulty swallowing no.  Nausea no.     FEMALE REPRODUCTIVE:  Vulvar pain no.  Vulvar rash , yes with pruritus .  Abnormal vaginal bleeding no.  Breast pain no.  Nipple discharge no.  Pain with intercourse no.  Pelvic pain no.  Unusual vaginal discharge no.  Vaginal itching no.     MUSCULOSKELETAL:  Muscle aches no.     NEUROLOGY:  Headache no.  Tingling/numbness no.  Weakness no.     PSYCHOLOGY:  Depression no.  Anxiety no.  Nervousness no.  Sleep disturbances no.  Suicidal ideation no .     ENDOCRINOLOGY:  Excessive thirst no.  Excessive urination no.  Hair loss no.  Heat or cold intolerance no.     HEMATOLOGY/LYMPH:  Abnormal bleeding no.  Easy bruising no.  Swollen glands no.     DERMATOLOGY:  New/changing skin lesion no.  Rash no.  Sores no.  Vital Signs Wt: 280.0, Wt change: 12.8 lbs, Ht: 65, BMI: 46.59, Pulse sitting: 74, BP sitting: 148/82. Examination    General Examination: CONSTITUTIONAL:  alert, oriented, NAD.  SKIN:  moist, warm.  EYES:  Conjunctiva clear.  LUNGS:  good I:E efffort noted , clear to auscultation bilaterally.  HEART:  regular rate and rhythm.  ABDOMEN:  soft, non-tender/non-distended, bowel sounds present.  FEMALE GENITOURINARY:  2.0 cm hypopigmented raised  lesion on the Right labia majora extending to the vaginal introitus, vagina - pink moist mucosa, no lesions or abnormal discharge, cervix - no discharge or lesions or CMT, adnexa - no masses or tenderness, uterus - nontender and normal size on palpation.  EXTREMITIES:  no edema present.  PSYCH:  affect normal, good eye contact.  Physical Examination    Chaperone present:  Chaperone present  Debby Freiberg 02/01/2023 10:05:23 AM >, for pelvic exam.  Assessments 1. VIN II (vulvar intraepithelial neoplasia II) - N90.1 (Primary)    2. Vulvar lesion - N90.89    Treatment 1. VIN II (vulvar intraepithelial neoplasia II)        Notes: plan wide local excision of vulva.. r/b/a of surgery were discussed with the patient including but not limited to infection, bleeding, positive margins, postsurgical pain and poor cosmesis. possible need for further surgery. she voiced understanding and desires to proceed. she is advised to avoid eating or drinking after midnight the night prior to surgery. she will be unable to drive for 24 hours post surgery due to anesthesia. she is to avoid sexual activity for at least 4-6 weeks post surgery   2. Vulvar lesion        Notes: plan wide local excision of vulva.. r/b/a of surgery were discussed with the patient including but not limited to infection, bleeding, positive margins, postsurgical pain and poor cosmesis. possible need for further surgery. she voiced understanding and desires to proceed.

## 2023-02-15 NOTE — Anesthesia Preprocedure Evaluation (Signed)
Anesthesia Evaluation  Patient identified by MRN, date of birth, ID band Patient awake    Reviewed: Allergy & Precautions, NPO status , Patient's Chart, lab work & pertinent test results  History of Anesthesia Complications (+) DIFFICULT AIRWAY and history of anesthetic complications (During parathyroid surgery)  Airway Mallampati: II  TM Distance: >3 FB Neck ROM: Full    Dental no notable dental hx.    Pulmonary asthma , sleep apnea , COPD, former smoker   Pulmonary exam normal        Cardiovascular hypertension, Normal cardiovascular exam     Neuro/Psych  PSYCHIATRIC DISORDERS Anxiety Depression    negative neurological ROS     GI/Hepatic negative GI ROS, Neg liver ROS,,,  Endo/Other    Class 3 obesity  Renal/GU negative Renal ROS     Musculoskeletal  (+) Arthritis ,    Abdominal  (+) + obese  Peds  Hematology negative hematology ROS (+)   Anesthesia Other Findings Vulvar lesion  Vulvar intraepithelial neoplasia II    Reproductive/Obstetrics                             Anesthesia Physical Anesthesia Plan  ASA: 3  Anesthesia Plan: General   Post-op Pain Management:    Induction: Intravenous  PONV Risk Score and Plan: 3 and Ondansetron, Dexamethasone and Treatment may vary due to age or medical condition  Airway Management Planned: Oral ETT and Video Laryngoscope Planned  Additional Equipment:   Intra-op Plan:   Post-operative Plan: Extubation in OR  Informed Consent: I have reviewed the patients History and Physical, chart, labs and discussed the procedure including the risks, benefits and alternatives for the proposed anesthesia with the patient or authorized representative who has indicated his/her understanding and acceptance.     Dental advisory given  Plan Discussed with: CRNA  Anesthesia Plan Comments: (PAT note written 02/15/2023 by Shonna Chock, PA-C.  History of difficult intubation. )       Anesthesia Quick Evaluation

## 2023-02-15 NOTE — Progress Notes (Signed)
Anesthesia Chart Review: Tammy Mccoy  Case: [id number] Date/Time: 02/16/23 0815   Procedure: VULVECTOMY   Anesthesia type: Choice   Pre-op diagnosis:      Vulvar lesion     Vulvar intraepithelial neoplasia II   [facility] 07 / [facility]   Surgeons: Gerald Leitz, MD       DISCUSSION: Patient is a 72 year old female scheduled for the above procedure.    History includes former smoker (quit 03/01/08), DIFFICULT INTUBATION, HTN, pre-diabetes, COPD, exercise induced asthma, OSA, claustrophobia, thyroid nodules, palpitations, dyspnea, LE edema, morbid obesity, alcohol abuse (sober 2009), VIN II, primary hyperparathyroidism (s/p left superior parathyroidectomy 06/11/16), skin cancer (SCC, BCC), cholecystectomy (03/31/21), varicose veins (s/p ablation left GSV 11/16/16)  In regards to difficult intubation history, she had an initial anticipated difficult intubation on 06/11/16 with successful intubation after 3 attempts using video-laryngoscopy. Summary of most recent intubations through [place]: 06/11/16 Intubation Type: IV induction Ventilation: Mask ventilation without difficulty and Oral airway inserted - appropriate to patient sizeLaryngoscope Size: Glidescope and 4 Grade View: Grade II Tube type: Oral Tube size: 7.0 mm Number of attempts: 3 Airway Equipment and Method: Video-laryngoscopy Dental Injury: Bloody posterior oropharynx  Difficulty Due To: Difficulty was anticipated, Difficult Airway- due to large tongue, Difficult Airway- due to anterior larynx and Difficult Airway- due to limited oral opening Future Recommendations: Recommend- induction with short-acting agent, and alternative techniques readily available Comments: Attempted laryngoscopy with Miller 2 and unable to visualize cords.  Tissue noted dependent of tracheal opening that blocked opening.  Unable to visualize with MAC 4.  Proceeded to glidescope.  Cords visualized anteriorly.  03/31/21 Induction Type: IV  induction Ventilation: Two handed mask ventilation required and Mask ventilation with difficulty Laryngoscope Size: Glidescope and 4 Grade View: Grade I Tube type: Parker flex tip Tube size: 7.0 mm Number of attempts: 1 Airway Equipment and Method: Video-laryngoscopy and Stylet Dental Injury: Teeth and Oropharynx as per pre-operative assessment  Difficulty Due To: Difficulty was anticipated, Difficult Airway- due to reduced neck mobility, Difficult Airway- due to anterior larynx and Difficult Airway- due to limited oral opening Future Recommendations: Recommend- induction with short-acting agent, and alternative techniques readily available Comments: Right tonsillar tissue partially blocking tracheal opening.     Last EKG noted is > 66 year old. Last labs noted are > 53 days old. She is a same day work-up, so labs and EKG on arrival as indicated.  Anesthesia team to evaluate on the day of surgery.   VS: Ht 5\' 5"  (1.651 m)   Wt 127 kg   BMI 46.59 kg/m  08/05/22: BP 161/85   PROVIDERS: Laurann Montana, MD is PCP. Notes in Care Everywhere state, "Patient is cleared for surgery Per Dr. Laurann Montana."  Talmage Coin, MD is endocrinologist   LABS: For day of surgery as indicated. On 09/13/22 ([facility]) A1c 5.7%, glucose 93, BUN 17, Cr 0.83, Na 137, K 4.8, Ca 10.4, total bili 0.7, ALP 69, AST 20, ALT 16, WBC 5.5, H/H 14.0/42.9, PLT 366, TSH 2.25.    IMAGES: CT Chest LCS 07/02/22: IMPRESSION: 1. Lung-RADS 2, benign appearance or behavior. Continue annual screening with low-dose chest CT without contrast in 12 months. 2. Emphysema (ICD10-J43.9) and Aortic Atherosclerosis (ICD10-170.0)   US Thyroid 03/23/17: IMPRESSION: 1. Right nodule has been stable for greater than 10 years and has previously been biopsied. No further imaging follow-up or biopsy is indicated. - The above is in keeping with the ACR TI-RADS recommendations -  J Am Coll Radiol 2017;14:587-595.   EKG: Last EKG noted is  greater than 28-year-old. EKG 03/04/21:  Normal sinus rhythm Incomplete right bundle branch block Borderline ECG No previous ECGs available Confirmed by Tonny Bollman [phone]) on 03/05/2021 4:54:38 AM   CV: Normal ETT 05/08/13.  Past Medical History:  Diagnosis Date   Alcohol abuse    pt stopped drinking alcohol around 2009   Anxiety    Arthritis    bil knees and neck   Asthma    excersice   B12 deficiency    Back pain    Claustrophobia    Claustrophobia    Colon polyps    Constipation    COPD (chronic obstructive pulmonary disease) (HCC)    Depression    Difficult intubation 06/11/2016   glidescope grade 2, pt states there was no difficulty with airway during cholecystectomy   Diverticulitis    Family history of adverse reaction to anesthesia    sister has vomiting   Family history of bladder cancer    Family history of breast cancer    Family history of colon cancer    Family history of thyroid cancer    H/O rosacea    Hyperparathyroidism (HCC)    Hypertension    Joint pain    Multiple thyroid nodules    Multiple thyroid nodules    benign has checked yearly   Multiple thyroid nodules    benign per pt   Palpitations    Pre-diabetes    Skin cancer, basal cell    Sleep apnea    no cpap   SOB (shortness of breath)    Squamous cell skin cancer    Swelling of both lower extremities    Thyroid disease    Vitamin D deficiency     Past Surgical History:  Procedure Laterality Date   CARPAL TUNNEL RELEASE     Bilateral   CESAREAN SECTION     x 2   CHOLECYSTECTOMY     ENDOVENOUS ABLATION SAPHENOUS VEIN W/ LASER Left 11/16/2016   endovenous laser ablation L GSV and stab phlebectomy > 20 incisions left leg by Josephina Gip MD    PARATHYROIDECTOMY Left 06/11/2016   Procedure: LEFT PARATHYROIDECTOMY;  Surgeon: Darnell Level, MD;  Location: [facility] ORS;  Service: General;  Laterality: Left;   TONSILLECTOMY AND ADENOIDECTOMY     TUBAL LIGATION      MEDICATIONS: No current  facility-administered medications for this encounter.    acetaminophen (TYLENOL) 650 MG CR tablet   ALPRAZolam (XANAX) 0.5 MG tablet   ANORO ELLIPTA 62.5-25 MCG/ACT AEPB   Cholecalciferol (VITAMIN D3) 2000 units capsule   Cyanocobalamin (B-12 PO)   desonide (DESOWEN) 0.05 % cream   doxycycline (VIBRAMYCIN) 100 MG capsule   FLUoxetine (PROZAC) 10 MG capsule   fluticasone (FLONASE) 50 MCG/ACT nasal spray   ketoconazole (NIZORAL) 2 % cream   Loteprednol-Tobramycin (ZYLET OP)   metFORMIN (GLUCOPHAGE-XR) 500 MG 24 hr tablet   pravastatin (PRAVACHOL) 10 MG tablet   triamterene-hydrochlorothiazide (MAXZIDE-25) 37.5-25 MG tablet   naproxen (NAPROSYN) 500 MG tablet   Shonna Chock, PA-C Surgical Short Stay/Anesthesiology [facility] Phone [phone] [facility] Phone [phone] 02/15/2023 9:56 AM

## 2023-02-16 ENCOUNTER — Encounter (HOSPITAL_COMMUNITY)
Admission: RE | Disposition: A | Discharge status: Home / Self Care | Payer: Self-pay | Source: Home / Self Care | Attending: Obstetrics and Gynecology

## 2023-02-16 ENCOUNTER — Encounter (HOSPITAL_COMMUNITY): Payer: Self-pay | Admitting: Obstetrics and Gynecology

## 2023-02-16 ENCOUNTER — Ambulatory Visit (HOSPITAL_COMMUNITY)
Admission: RE | Admit: 2023-02-16 | Discharge: 2023-02-16 | Disposition: A | Discharge status: Home / Self Care | Payer: PPO | Attending: Obstetrics and Gynecology | Admitting: Obstetrics and Gynecology

## 2023-02-16 ENCOUNTER — Ambulatory Visit (HOSPITAL_COMMUNITY): Payer: Self-pay | Admitting: Vascular Surgery

## 2023-02-16 ENCOUNTER — Other Ambulatory Visit: Payer: Self-pay

## 2023-02-16 ENCOUNTER — Ambulatory Visit (HOSPITAL_BASED_OUTPATIENT_CLINIC_OR_DEPARTMENT_OTHER): Payer: PPO | Admitting: Vascular Surgery

## 2023-02-16 DIAGNOSIS — R7303 Prediabetes: Secondary | ICD-10-CM | POA: Insufficient documentation

## 2023-02-16 DIAGNOSIS — J4489 Other specified chronic obstructive pulmonary disease: Secondary | ICD-10-CM | POA: Insufficient documentation

## 2023-02-16 DIAGNOSIS — D071 Carcinoma in situ of vulva: Secondary | ICD-10-CM | POA: Insufficient documentation

## 2023-02-16 DIAGNOSIS — N901 Moderate vulvar dysplasia: Secondary | ICD-10-CM

## 2023-02-16 DIAGNOSIS — D072 Carcinoma in situ of vagina: Secondary | ICD-10-CM | POA: Diagnosis not present

## 2023-02-16 DIAGNOSIS — I10 Essential (primary) hypertension: Secondary | ICD-10-CM | POA: Diagnosis not present

## 2023-02-16 DIAGNOSIS — Z9049 Acquired absence of other specified parts of digestive tract: Secondary | ICD-10-CM | POA: Diagnosis not present

## 2023-02-16 DIAGNOSIS — N9089 Other specified noninflammatory disorders of vulva and perineum: Secondary | ICD-10-CM | POA: Insufficient documentation

## 2023-02-16 DIAGNOSIS — G4730 Sleep apnea, unspecified: Secondary | ICD-10-CM | POA: Insufficient documentation

## 2023-02-16 DIAGNOSIS — Z6841 Body Mass Index (BMI) 40.0 and over, adult: Secondary | ICD-10-CM | POA: Diagnosis not present

## 2023-02-16 DIAGNOSIS — J449 Chronic obstructive pulmonary disease, unspecified: Secondary | ICD-10-CM | POA: Diagnosis not present

## 2023-02-16 DIAGNOSIS — Z87891 Personal history of nicotine dependence: Secondary | ICD-10-CM | POA: Diagnosis not present

## 2023-02-16 DIAGNOSIS — Z85828 Personal history of other malignant neoplasm of skin: Secondary | ICD-10-CM | POA: Insufficient documentation

## 2023-02-16 HISTORY — PX: VULVECTOMY: SHX1086

## 2023-02-16 LAB — BASIC METABOLIC PANEL
Anion gap: 8 (ref 5–15)
BUN: 17 mg/dL (ref 8–23)
CO2: 25 mmol/L (ref 22–32)
Calcium: 9 mg/dL (ref 8.9–10.3)
Chloride: 103 mmol/L (ref 98–111)
Creatinine, Ser: 0.85 mg/dL (ref 0.44–1.00)
GFR, Estimated: >60 mL/min (ref >60)
Glucose, Bld: 107 mg/dL — ABNORMAL HIGH (ref 70–99)
Potassium: 3.9 mmol/L (ref 3.5–5.1)
Sodium: 136 mmol/L (ref 135–145)

## 2023-02-16 LAB — CBC
HCT: 40.9 % (ref 36.0–46.0)
Hemoglobin: 13.3 g/dL (ref 12.0–15.0)
MCH: 30.2 pg (ref 26.0–34.0)
MCHC: 32.5 g/dL (ref 30.0–36.0)
MCV: 92.7 fL (ref 80.0–100.0)
Platelets: 316 10*3/uL (ref 150–400)
RBC: 4.41 MIL/uL (ref 3.87–5.11)
RDW: 12.8 % (ref 11.5–15.5)
WBC: 4.9 10*3/uL (ref 4.0–10.5)
nRBC: 0 % (ref 0.0–0.2)

## 2023-02-16 LAB — GLUCOSE, CAPILLARY: Glucose-Capillary: 112 mg/dL — ABNORMAL HIGH (ref 70–99)

## 2023-02-16 SURGERY — VULVECTOMY
Anesthesia: General | Site: Vagina

## 2023-02-16 MED ORDER — NAPROXEN 500 MG PO TABS: 500.0000 mg | ORAL_TABLET | Freq: Two times a day (BID) | ORAL | 0 refills | Status: DC

## 2023-02-16 MED ORDER — SODIUM CHLORIDE 0.9 % IV SOLN: INTRAVENOUS | Status: DC | PRN

## 2023-02-16 MED ORDER — ROCURONIUM BROMIDE 10 MG/ML (PF) SYRINGE
PREFILLED_SYRINGE | INTRAVENOUS | Status: AC
  Filled 2023-02-16: qty 10

## 2023-02-16 MED ORDER — LIDOCAINE-EPINEPHRINE 1 %-1:100000 IJ SOLN
INTRAMUSCULAR | Status: DC | PRN
  Administered 2023-02-16: 20 mL

## 2023-02-16 MED ORDER — PHENYLEPHRINE 80 MCG/ML (10ML) SYRINGE FOR IV PUSH (FOR BLOOD PRESSURE SUPPORT)
PREFILLED_SYRINGE | INTRAVENOUS | Status: AC
  Filled 2023-02-16: qty 10

## 2023-02-16 MED ORDER — LIDOCAINE 2% (20 MG/ML) 5 ML SYRINGE
INTRAMUSCULAR | Status: DC | PRN
  Administered 2023-02-16: 80 mg via INTRAVENOUS

## 2023-02-16 MED ORDER — OXYCODONE HCL 5 MG PO TABS: 5.0000 mg | ORAL_TABLET | Freq: Four times a day (QID) | ORAL | 0 refills | Status: DC | PRN

## 2023-02-16 MED ORDER — ONDANSETRON HCL 4 MG/2ML IJ SOLN
INTRAMUSCULAR | Status: DC | PRN
  Administered 2023-02-16: 4 mg via INTRAVENOUS

## 2023-02-16 MED ORDER — SUCCINYLCHOLINE CHLORIDE 200 MG/10ML IV SOSY
PREFILLED_SYRINGE | INTRAVENOUS | Status: DC | PRN
  Administered 2023-02-16: 180 mg via INTRAVENOUS

## 2023-02-16 MED ORDER — DEXAMETHASONE SODIUM PHOSPHATE 10 MG/ML IJ SOLN
INTRAMUSCULAR | Status: AC
  Filled 2023-02-16: qty 1

## 2023-02-16 MED ORDER — ACETAMINOPHEN 500 MG PO TABS: 1000.0000 mg | ORAL_TABLET | Freq: Three times a day (TID) | ORAL | 0 refills | Status: AC | PRN

## 2023-02-16 MED ORDER — GLYCOPYRROLATE PF 0.2 MG/ML IJ SOSY
PREFILLED_SYRINGE | INTRAMUSCULAR | Status: AC
  Filled 2023-02-16: qty 1

## 2023-02-16 MED ORDER — ALBUTEROL SULFATE HFA 108 (90 BASE) MCG/ACT IN AERS
INHALATION_SPRAY | RESPIRATORY_TRACT | Status: AC
  Filled 2023-02-16: qty 6.7

## 2023-02-16 MED ORDER — 0.9 % SODIUM CHLORIDE (POUR BTL) OPTIME
TOPICAL | Status: DC | PRN
  Administered 2023-02-16: 1000 mL

## 2023-02-16 MED ORDER — SUCCINYLCHOLINE CHLORIDE 200 MG/10ML IV SOSY
PREFILLED_SYRINGE | INTRAVENOUS | Status: AC
  Filled 2023-02-16: qty 10

## 2023-02-16 MED ORDER — PROPOFOL 10 MG/ML IV BOLUS
INTRAVENOUS | Status: AC
Start: 2023-02-16 — End: ?
  Filled 2023-02-16: qty 20

## 2023-02-16 MED ORDER — ORAL CARE MOUTH RINSE: 15.0000 mL | Freq: Once | OROMUCOSAL | Status: AC

## 2023-02-16 MED ORDER — FENTANYL CITRATE (PF) 250 MCG/5ML IJ SOLN
INTRAMUSCULAR | Status: AC
  Filled 2023-02-16: qty 5

## 2023-02-16 MED ORDER — PROPOFOL 10 MG/ML IV BOLUS
INTRAVENOUS | Status: DC | PRN
  Administered 2023-02-16: 200 mg via INTRAVENOUS

## 2023-02-16 MED ORDER — PROPOFOL 10 MG/ML IV BOLUS
INTRAVENOUS | Status: AC
  Filled 2023-02-16: qty 20

## 2023-02-16 MED ORDER — PROPOFOL 1000 MG/100ML IV EMUL
INTRAVENOUS | Status: AC
  Filled 2023-02-16: qty 100

## 2023-02-16 MED ORDER — KETOROLAC TROMETHAMINE 30 MG/ML IJ SOLN
INTRAMUSCULAR | Status: AC
  Filled 2023-02-16: qty 1

## 2023-02-16 MED ORDER — DEXAMETHASONE SODIUM PHOSPHATE 10 MG/ML IJ SOLN
INTRAMUSCULAR | Status: DC | PRN
  Administered 2023-02-16: 4 mg via INTRAVENOUS

## 2023-02-16 MED ORDER — PHENYLEPHRINE HCL (PRESSORS) 10 MG/ML IV SOLN
INTRAVENOUS | Status: DC | PRN
  Administered 2023-02-16 (×2): 80 ug via INTRAVENOUS
  Administered 2023-02-16: 160 ug via INTRAVENOUS
  Administered 2023-02-16: 80 ug via INTRAVENOUS

## 2023-02-16 MED ORDER — POVIDONE-IODINE 10 % EX SWAB
2.0000 | Freq: Once | CUTANEOUS | Status: AC
Start: 2023-02-16 — End: 2023-02-16
  Administered 2023-02-16: 2 via TOPICAL

## 2023-02-16 MED ORDER — ACETIC ACID 5 % SOLN
Status: DC | PRN
  Administered 2023-02-16: 1 via TOPICAL

## 2023-02-16 MED ORDER — LACTATED RINGERS IV SOLN: INTRAVENOUS | Status: DC

## 2023-02-16 MED ORDER — ONDANSETRON HCL 4 MG/2ML IJ SOLN
INTRAMUSCULAR | Status: AC
  Filled 2023-02-16: qty 2

## 2023-02-16 MED ORDER — ESMOLOL HCL 100 MG/10ML IV SOLN
INTRAVENOUS | Status: AC
  Filled 2023-02-16: qty 10

## 2023-02-16 MED ORDER — LIDOCAINE 2% (20 MG/ML) 5 ML SYRINGE
INTRAMUSCULAR | Status: AC
  Filled 2023-02-16: qty 5

## 2023-02-16 MED ORDER — CHLORHEXIDINE GLUCONATE 0.12 % MT SOLN: 15.0000 mL | Freq: Once | OROMUCOSAL | Status: AC

## 2023-02-16 MED ORDER — ACETAMINOPHEN 500 MG PO TABS
ORAL_TABLET | ORAL | Status: AC
  Administered 2023-02-16: 1000 mg via ORAL
  Filled 2023-02-16: qty 2

## 2023-02-16 MED ORDER — CHLORHEXIDINE GLUCONATE 0.12 % MT SOLN
OROMUCOSAL | Status: AC
  Administered 2023-02-16: 15 mL via OROMUCOSAL
  Filled 2023-02-16: qty 15

## 2023-02-16 MED ORDER — FENTANYL CITRATE (PF) 250 MCG/5ML IJ SOLN
INTRAMUSCULAR | Status: DC | PRN
  Administered 2023-02-16 (×2): 50 ug via INTRAVENOUS

## 2023-02-16 MED ORDER — EPHEDRINE 5 MG/ML INJ
INTRAVENOUS | Status: AC
  Filled 2023-02-16: qty 5

## 2023-02-16 MED ORDER — ACETAMINOPHEN 500 MG PO TABS: 1000.0000 mg | ORAL_TABLET | ORAL | Status: AC

## 2023-02-16 SURGICAL SUPPLY — 17 items
BLADE SURG 15 STRL LF DISP TIS (BLADE) ×1 IMPLANT
ELECT REM PT RETURN 9FT ADLT (ELECTROSURGICAL) ×1 IMPLANT
ELECTRODE REM PT RTRN 9FT ADLT (ELECTROSURGICAL) ×1 IMPLANT
GAUZE 4X4 16PLY ~~LOC~~+RFID DBL (SPONGE) ×1 IMPLANT
GLOVE BIO SURGEON STRL SZ7 (GLOVE) ×1 IMPLANT
GLOVE BIOGEL PI IND STRL 7.0 (GLOVE) ×2 IMPLANT
GOWN STRL REUS W/ TWL LRG LVL3 (GOWN DISPOSABLE) ×1 IMPLANT
GOWN STRL REUS W/ TWL XL LVL3 (GOWN DISPOSABLE) ×1 IMPLANT
NDL HYPO 25X1 1.5 SAFETY (NEEDLE) ×1 IMPLANT
NEEDLE HYPO 25X1 1.5 SAFETY (NEEDLE) ×1 IMPLANT
PACK VAGINAL WOMENS (CUSTOM PROCEDURE TRAY) ×1 IMPLANT
PAD OB MATERNITY 4.3X12.25 (PERSONAL CARE ITEMS) ×1 IMPLANT
SLEEVE SCD COMPRESS KNEE MED (STOCKING) ×1 IMPLANT
SPIKE FLUID TRANSFER (MISCELLANEOUS) ×1 IMPLANT
SUT VIC AB 3-0 SH 27X BRD (SUTURE) IMPLANT
SUT VIC AB 4-0 SH 27XANBCTRL (SUTURE) IMPLANT
TOWEL GREEN STERILE FF (TOWEL DISPOSABLE) ×1 IMPLANT

## 2023-02-16 NOTE — Op Note (Addendum)
02/16/2023  10:15 AM  PATIENT:  Tammy Mccoy  72 y.o. female  PRE-OPERATIVE DIAGNOSIS:  Vulvar lesion, Vulvar intraepithelial neoplasia II  POST-OPERATIVE DIAGNOSIS:  Vulvar lesion, Vulvar intraepithelial neoplasia II  PROCEDURE:  Procedure(s): VULVECTOMY (N/A) Partial Vulvectomy   SURGEON:  Surgeons and Role:    Gerald Leitz, MD - Primary  PHYSICIAN ASSISTANT:   ASSISTANTS: none   ANESTHESIA:   general  EBL:  50 mL   BLOOD ADMINISTERED:none  DRAINS: none   LOCAL MEDICATIONS USED:  LIDOCAINE   SPECIMEN:  Source of Specimen:  Portion of the right labia majora   DISPOSITION OF SPECIMEN:  PATHOLOGY  COUNTS:  YES  TOURNIQUET:  * No tourniquets in log *  DICTATION: .Note written in EPIC  PLAN OF CARE: Discharge to home after PACU  PATIENT DISPOSITION:  PACU - hemodynamically stable.   Delay start of Pharmacological VTE agent (>24 hrs) due to surgical blood loss or risk of bleeding: not applicable  Findings: After application of acetic acid. Pt was noted to have a 3 cm area of hypopigmented skin on the Right labia majora extending toward the vagina introitus.   Procedure: Patient was taken to the operating room where she was placed under general anesthesia. She was placed in the dorsal lithotomy position. She was prepped and draped in the usual sterile fashion.   Acetic acid was applied to the vulva. An adequate margin of normal tissue ws marked around the lesion on the vulva. The was carefully excised with scalpel ensuring that the surrounding healthy tissue was preserved. Hemostasis was achieved  using electrocautery. The resection including an inferior portion of the Right labia majora. The wound was closed by re approximating the healthy vulvar skin using 3-0 vicryl in a running fashion.    She was awakened from anesthesia taken care to the recovery room awake and in stable condition. Sponge lap and needle counts were correct x 2.

## 2023-02-16 NOTE — Anesthesia Postprocedure Evaluation (Signed)
Anesthesia Post Note  Patient: Tammy Mccoy  Procedure(s) Performed: VULVECTOMY (Vagina )     Patient location during evaluation: PACU Anesthesia Type: General Level of consciousness: awake Pain management: pain level controlled Vital Signs Assessment: post-procedure vital signs reviewed and stable Respiratory status: spontaneous breathing, nonlabored ventilation and respiratory function stable Cardiovascular status: blood pressure returned to baseline and stable Postop Assessment: no apparent nausea or vomiting Anesthetic complications: no   No notable events documented.  Last Vitals:  Vitals:   02/16/23 1015 02/16/23 1030  BP: (!) 140/68   Pulse: 80   Resp: (!) 24   Temp:  36.7 C  SpO2: 95%     Last Pain:  Vitals:   02/16/23 1030  TempSrc:   PainSc: 0-No pain                 Tiffanye Hartmann P Herrick Hartog

## 2023-02-16 NOTE — H&P (Signed)
Date of Initial H&P: 02/14/2023  History reviewed, patient examined, no change in status, stable for surgery.

## 2023-02-16 NOTE — Transfer of Care (Signed)
Immediate Anesthesia Transfer of Care Note  Patient: Tammy Mccoy  Procedure(s) Performed: VULVECTOMY (Vagina )  Patient Location: PACU  Anesthesia Type:General  Level of Consciousness: awake, alert , and oriented  Airway & Oxygen Therapy: Patient Spontanous Breathing and Patient connected to face mask  Post-op Assessment: Report given to RN, Post -op Vital signs reviewed and stable, and Patient moving all extremities X 4  Post vital signs: Reviewed and stable  Last Vitals:  Vitals Value Taken Time  BP 138/78 02/16/23 1005  Temp 37 C 02/16/23 1005  Pulse 79 02/16/23 1005  Resp 19 02/16/23 1005  SpO2 97 % 02/16/23 1005  Vitals shown include unfiled device data.  Last Pain:  Vitals:   02/16/23 1005  TempSrc: Tympanic  PainSc:          Complications: No notable events documented.

## 2023-02-16 NOTE — Anesthesia Procedure Notes (Signed)
Procedure Name: Intubation Date/Time: 02/16/2023 8:50 AM  Performed by: Marquis Buggy, CRNAPre-anesthesia Checklist: Patient identified, Emergency Drugs available, Suction available and Patient being monitored Patient Re-evaluated:Patient Re-evaluated prior to induction Oxygen Delivery Method: Circle system utilized Preoxygenation: Pre-oxygenation with 100% oxygen Induction Type: IV induction Laryngoscope Size: Glidescope and 3 Grade View: Grade II Tube type: Oral Tube size: 7.0 mm Number of attempts: 1 Airway Equipment and Method: Stylet Placement Confirmation: ETT inserted through vocal cords under direct vision, positive ETCO2, CO2 detector and breath sounds checked- equal and bilateral Secured at: 22 cm Dental Injury: Teeth and Oropharynx as per pre-operative assessment  Future Recommendations: Recommend- induction with short-acting agent, and alternative techniques readily available Comments: Anterior airway with a lot of tissue with glidescope intubation. IIb view with glide without anterior pressure and minimal view. Grade I with anterior pressure. Recommend future glide use.

## 2023-02-17 ENCOUNTER — Encounter (HOSPITAL_COMMUNITY): Payer: Self-pay | Admitting: Obstetrics and Gynecology

## 2023-02-17 LAB — SURGICAL PATHOLOGY

## 2023-03-31 DIAGNOSIS — H25813 Combined forms of age-related cataract, bilateral: Secondary | ICD-10-CM | POA: Diagnosis not present

## 2023-03-31 DIAGNOSIS — H40013 Open angle with borderline findings, low risk, bilateral: Secondary | ICD-10-CM | POA: Diagnosis not present

## 2023-03-31 DIAGNOSIS — H35342 Macular cyst, hole, or pseudohole, left eye: Secondary | ICD-10-CM | POA: Diagnosis not present

## 2023-03-31 DIAGNOSIS — H04123 Dry eye syndrome of bilateral lacrimal glands: Secondary | ICD-10-CM | POA: Diagnosis not present

## 2023-05-11 DIAGNOSIS — M170 Bilateral primary osteoarthritis of knee: Secondary | ICD-10-CM | POA: Diagnosis not present

## 2023-05-26 DIAGNOSIS — M6281 Muscle weakness (generalized): Secondary | ICD-10-CM | POA: Diagnosis not present

## 2023-05-26 DIAGNOSIS — M25561 Pain in right knee: Secondary | ICD-10-CM | POA: Diagnosis not present

## 2023-05-26 DIAGNOSIS — M25562 Pain in left knee: Secondary | ICD-10-CM | POA: Diagnosis not present

## 2023-05-30 DIAGNOSIS — Z1231 Encounter for screening mammogram for malignant neoplasm of breast: Secondary | ICD-10-CM | POA: Diagnosis not present

## 2023-06-02 DIAGNOSIS — R7303 Prediabetes: Secondary | ICD-10-CM | POA: Diagnosis not present

## 2023-06-02 DIAGNOSIS — Z8639 Personal history of other endocrine, nutritional and metabolic disease: Secondary | ICD-10-CM | POA: Diagnosis not present

## 2023-06-02 DIAGNOSIS — Z808 Family history of malignant neoplasm of other organs or systems: Secondary | ICD-10-CM | POA: Diagnosis not present

## 2023-06-02 DIAGNOSIS — E042 Nontoxic multinodular goiter: Secondary | ICD-10-CM | POA: Diagnosis not present

## 2023-06-02 DIAGNOSIS — G4733 Obstructive sleep apnea (adult) (pediatric): Secondary | ICD-10-CM | POA: Diagnosis not present

## 2023-06-06 DIAGNOSIS — N6311 Unspecified lump in the right breast, upper outer quadrant: Secondary | ICD-10-CM | POA: Diagnosis not present

## 2023-06-14 ENCOUNTER — Other Ambulatory Visit: Payer: Self-pay | Admitting: Radiology

## 2023-06-14 DIAGNOSIS — Z170 Estrogen receptor positive status [ER+]: Secondary | ICD-10-CM | POA: Diagnosis not present

## 2023-06-14 DIAGNOSIS — N6315 Unspecified lump in the right breast, overlapping quadrants: Secondary | ICD-10-CM | POA: Diagnosis not present

## 2023-06-14 DIAGNOSIS — C50811 Malignant neoplasm of overlapping sites of right female breast: Secondary | ICD-10-CM | POA: Diagnosis not present

## 2023-06-15 ENCOUNTER — Telehealth: Payer: Self-pay | Admitting: *Deleted

## 2023-06-15 LAB — SURGICAL PATHOLOGY

## 2023-06-15 NOTE — Telephone Encounter (Signed)
 Spoke to patient to confirm upcoming morning [facility] clinic appointment on 4/30, paperwork will be sent via [employer].  Gave location and time, also informed patient that the surgeon's office would be calling as well to get information from them similar to the packet that they will be receiving so make sure to do both.  Reminded patient that all providers will be coming to the clinic to see them HERE and if they had any questions to not hesitate to reach back out to myself or their navigators.

## 2023-06-27 ENCOUNTER — Encounter: Payer: Self-pay | Admitting: Family Medicine

## 2023-06-27 ENCOUNTER — Encounter: Payer: Self-pay | Admitting: *Deleted

## 2023-06-27 DIAGNOSIS — Z170 Estrogen receptor positive status [ER+]: Secondary | ICD-10-CM | POA: Insufficient documentation

## 2023-06-29 ENCOUNTER — Ambulatory Visit: Admitting: Physical Therapy

## 2023-06-29 ENCOUNTER — Ambulatory Visit: Payer: Self-pay | Admitting: Surgery

## 2023-06-29 ENCOUNTER — Inpatient Hospital Stay: Attending: Hematology and Oncology | Admitting: Hematology and Oncology

## 2023-06-29 ENCOUNTER — Encounter: Payer: Self-pay | Admitting: Hematology and Oncology

## 2023-06-29 ENCOUNTER — Encounter: Payer: Self-pay | Admitting: Genetic Counselor

## 2023-06-29 ENCOUNTER — Inpatient Hospital Stay: Admitting: *Deleted

## 2023-06-29 ENCOUNTER — Ambulatory Visit
Admission: RE | Admit: 2023-06-29 | Discharge: 2023-06-29 | Disposition: A | Discharge status: Home / Self Care | Source: Ambulatory Visit | Attending: Radiation Oncology | Admitting: Radiation Oncology

## 2023-06-29 ENCOUNTER — Inpatient Hospital Stay: Admitting: Licensed Clinical Social Worker

## 2023-06-29 ENCOUNTER — Encounter: Payer: Self-pay | Admitting: *Deleted

## 2023-06-29 VITALS — BP 147/54 | HR 84 | Temp 97.2°F | Resp 18 | Ht 65.35 in | Wt 283.0 lb

## 2023-06-29 DIAGNOSIS — C50911 Malignant neoplasm of unspecified site of right female breast: Secondary | ICD-10-CM | POA: Diagnosis not present

## 2023-06-29 DIAGNOSIS — Z170 Estrogen receptor positive status [ER+]: Secondary | ICD-10-CM

## 2023-06-29 DIAGNOSIS — Z1732 Human epidermal growth factor receptor 2 negative status: Secondary | ICD-10-CM | POA: Insufficient documentation

## 2023-06-29 DIAGNOSIS — Z1721 Progesterone receptor positive status: Secondary | ICD-10-CM | POA: Insufficient documentation

## 2023-06-29 DIAGNOSIS — C50411 Malignant neoplasm of upper-outer quadrant of right female breast: Secondary | ICD-10-CM | POA: Diagnosis not present

## 2023-06-29 DIAGNOSIS — Z87891 Personal history of nicotine dependence: Secondary | ICD-10-CM | POA: Insufficient documentation

## 2023-06-29 DIAGNOSIS — G8929 Other chronic pain: Secondary | ICD-10-CM

## 2023-06-29 LAB — RESEARCH LABS

## 2023-06-29 LAB — CBC WITH DIFFERENTIAL (CANCER CENTER ONLY)
Abs Immature Granulocytes: 0.03 10*3/uL (ref 0.00–0.07)
Basophils Absolute: 0 10*3/uL (ref 0.0–0.1)
Basophils Relative: 0 %
Eosinophils Absolute: 0.1 10*3/uL (ref 0.0–0.5)
Eosinophils Relative: 1 %
HCT: 43.5 % (ref 36.0–46.0)
Hemoglobin: 14.3 g/dL (ref 12.0–15.0)
Immature Granulocytes: 0 %
Lymphocytes Relative: 22 %
Lymphs Abs: 2.1 10*3/uL (ref 0.7–4.0)
MCH: 29.5 pg (ref 26.0–34.0)
MCHC: 32.9 g/dL (ref 30.0–36.0)
MCV: 89.7 fL (ref 80.0–100.0)
Monocytes Absolute: 0.7 10*3/uL (ref 0.1–1.0)
Monocytes Relative: 8 %
Neutro Abs: 6.3 10*3/uL (ref 1.7–7.7)
Neutrophils Relative %: 69 %
Platelet Count: 313 10*3/uL (ref 150–400)
RBC: 4.85 MIL/uL (ref 3.87–5.11)
RDW: 12.5 % (ref 11.5–15.5)
WBC Count: 9.3 10*3/uL (ref 4.0–10.5)
nRBC: 0 % (ref 0.0–0.2)

## 2023-06-29 LAB — CMP (CANCER CENTER ONLY)
ALT: 16 U/L (ref 0–44)
AST: 19 U/L (ref 15–41)
Albumin: 4.3 g/dL (ref 3.5–5.0)
Alkaline Phosphatase: 74 U/L (ref 38–126)
Anion gap: 7 (ref 5–15)
BUN: 19 mg/dL (ref 8–23)
CO2: 27 mmol/L (ref 22–32)
Calcium: 9.6 mg/dL (ref 8.9–10.3)
Chloride: 102 mmol/L (ref 98–111)
Creatinine: 0.81 mg/dL (ref 0.44–1.00)
GFR, Estimated: >60 mL/min (ref >60)
Glucose, Bld: 99 mg/dL (ref 70–99)
Potassium: 4.5 mmol/L (ref 3.5–5.1)
Sodium: 136 mmol/L (ref 135–145)
Total Bilirubin: 0.6 mg/dL (ref 0.0–1.2)
Total Protein: 7.7 g/dL (ref 6.5–8.1)

## 2023-06-29 LAB — GENETIC SCREENING ORDER

## 2023-06-29 NOTE — Progress Notes (Signed)
 [facility] CONSULT NOTE  Patient Care Team: Tammy Grave, MD as PCP - General (Family Medicine) Alane Hsu, RN as Oncology Nurse Navigator Auther Bo, RN as Oncology Nurse Navigator [name], MD as Consulting Physician (General Surgery) Murleen Arms, MD as Consulting Physician (Hematology and Oncology) Retta Caster, MD as Consulting Physician (Radiation Oncology)  CHIEF COMPLAINTS/PURPOSE OF CONSULTATION:  Newly diagnosed breast cancer  HISTORY OF PRESENTING ILLNESS:  Tammy Mccoy 73 y.o. female is here because of recent diagnosis of right breast cancer  I reviewed her records extensively and collaborated the history with the patient.  SUMMARY OF ONCOLOGIC HISTORY: Oncology History  Malignant neoplasm of upper-outer quadrant of right breast in female, estrogen receptor positive (HCC)  05/24/2017 Genetic Testing   No pathogenic variants were detected. Of note, a VUS was identified in the MET gene (c.1030G>A (p.Gly344Arg)) on the multicancer panel. Report date is 05/25/2017.  Update: The MET VUS (c.1030G>A) has been reclassified to likely benign. Report date is 07/13/2021.  The Multi-Gene Panel offered by [facility] includes sequencing and/or deletion duplication testing of the following 83 genes: ALK, APC, ATM, AXIN2,BAP1,  BARD1, BLM, BMPR1A, BRCA1, BRCA2, BRIP1, CASR, CDC73, CDH1, CDK4, CDKN1B, CDKN1C, CDKN2A (p14ARF), CDKN2A (p16INK4a), CEBPA, CHEK2, CTNNA1, DICER1, DIS3L2, EGFR (c.2369C>T, p.Thr790Met variant only), EPCAM (Deletion/duplication testing only), FH, FLCN, GATA2, GPC3, GREM1 (Promoter region deletion/duplication testing only), HOXB13 (c.251G>A, p.Gly84Glu), HRAS, KIT, MAX, MEN1, MET, MITF (c.952G>A, p.Glu318Lys variant only), MLH1, MSH2, MSH3, MSH6, MUTYH, NBN, NF1, NF2, NTHL1, PALB2, PDGFRA, PHOX2B, PMS2, POLD1, POLE, POT1, PRKAR1A, PTCH1, PTEN, RAD50, RAD51C, RAD51D, RB1, RECQL4, RET, RUNX1, SDHAF2, SDHA (sequence changes only), SDHB,  SDHC, SDHD, SMAD4, SMARCA4, SMARCB1, SMARCE1, STK11, SUFU, TERT, TERT, TMEM127, TP53, TSC1, TSC2, VHL, WRN and WT1.  The report date is May 24, 2017.   06/27/2023 Initial Diagnosis   Malignant neoplasm of upper-outer quadrant of right breast in female, estrogen receptor positive (HCC)   06/29/2023 Cancer Staging   Staging form: Breast, AJCC 8th Edition - Clinical stage from 06/29/2023: Stage IA (cT1b, cN0, cM0, G2, ER+, PR+, HER2-) - Signed by Murleen Arms, MD on 06/29/2023 Stage prefix: Initial diagnosis Histologic grading system: 3 grade system     Discussed the use of AI scribe software for clinical note transcription with the patient, who gave verbal consent to proceed.  History of Present Illness Tammy Mccoy "Verne Golden" is a 73 year old female with a malignant breast mass who presents for oncology consultation.  She presents with a malignant mass in the right breast, located in the upper outer quadrant, measuring approximately six millimeters in its largest dimension and situated about eleven centimeters from the nipple. A biopsy confirmed invasive ductal cancer, which originated in the milk duct and extended into the breast tissue, classified as stage I  Her medical history includes arthritis, bad knees, prediabetes, and high cholesterol, for which she takes medication once a week. She also takes a half pill daily for blood pressure management. She underwent a vulvectomy in December 2024 for a precancerous area and had a basal cell skin cancer removed from her nose years ago.  Her family history is significant for breast cancer in her paternal grandmother and sister. Her sister was diagnosed in her early fifties, experienced a recurrence 25 years later, and recently passed away due to complications unrelated to breast cancer.  She quit smoking in 2010 and stopped drinking around 2009. She worked as an Database administrator.    MEDICAL HISTORY:  Past Medical  History:   Diagnosis Date   Alcohol abuse    pt stopped drinking alcohol around 2009   Anxiety    Arthritis    bil knees and neck   Asthma    excersice   B12 deficiency    Back pain    Breast cancer (HCC)    Claustrophobia    Claustrophobia    Colon polyps    Constipation    COPD (chronic obstructive pulmonary disease) (HCC)    Depression    Difficult intubation 06/11/2016   glidescope grade 2, pt states there was no difficulty with airway during cholecystectomy   Diverticulitis    Family history of adverse reaction to anesthesia    sister has vomiting   Family history of bladder cancer    Family history of breast cancer    Family history of colon cancer    Family history of thyroid  cancer    H/O rosacea    Hyperparathyroidism (HCC)    Hypertension    Joint pain    Multiple thyroid  nodules    Multiple thyroid  nodules    benign has checked yearly   Multiple thyroid  nodules    benign per pt   Palpitations    Pre-diabetes    Skin cancer, basal cell    Sleep apnea    no cpap   SOB (shortness of breath)    Squamous cell skin cancer    Swelling of both lower extremities    Thyroid  disease    Vitamin D deficiency     SURGICAL HISTORY: Past Surgical History:  Procedure Laterality Date   CARPAL TUNNEL RELEASE     Bilateral   CESAREAN SECTION     x 2   CHOLECYSTECTOMY     ENDOVENOUS ABLATION SAPHENOUS VEIN W/ LASER Left 11/16/2016   endovenous laser ablation L GSV and stab phlebectomy > 20 incisions left leg by Merced Stair MD    PARATHYROIDECTOMY Left 06/11/2016   Procedure: LEFT PARATHYROIDECTOMY;  Surgeon: Oralee Billow, MD;  Location: [facility];  Service: General;  Laterality: Left;   TONSILLECTOMY AND ADENOIDECTOMY     TUBAL LIGATION     VULVECTOMY N/A 02/16/2023   Procedure: Dannielle Dux;  Surgeon: Arlee Lace, MD;  Location: [facility];  Service: Gynecology;  Laterality: N/A;    SOCIAL HISTORY: Social History   Socioeconomic History   Marital status: Widowed    Spouse  name: Not on file   Number of children: 2   Years of education: Not on file   Highest education level: Not on file  Occupational History   Occupation: retired  Tobacco Use   Smoking status: Former    Current packs/day: 0.00    Average packs/day: 1 pack/day for 38.0 years (38.0 ttl pk-yrs)    Types: Cigarettes    Start date: 03/01/1970    Quit date: 03/01/2008    Years since quitting: 15.3   Smokeless tobacco: Never  Vaping Use   Vaping status: Never Used  Substance and Sexual Activity   Alcohol use: No    Comment: pt quit drinking around 2009   Drug use: No   Sexual activity: Not Currently  Other Topics Concern   Not on file  Social History Narrative   Used to drink alcohol.  None in the last 10 years.   Son lives with her.    She has two grands.  She helps take care of them.    Social Drivers of Corporate investment banker Strain: Not on file  Food Insecurity: Not on file  Transportation Needs: Not on file  Physical Activity: Not on file  Stress: Not on file  Social Connections: Not on file  Intimate Partner Violence: Not on file    FAMILY HISTORY: Family History  Problem Relation Age of Onset   Cancer Father        Bladder   Depression Father    Alcoholism Father    CVA Mother 49   Thyroid  cancer Mother        dx in her 59s   Colon cancer Mother        dx in her 61s, d. 69   Lung cancer Mother        dx in 49s   Cancer Mother        parotid gland cancer, d. 40   Stroke Mother    Depression Mother    Anxiety disorder Mother    Alcoholism Mother    Hyperparathyroidism Sister    Diabetes Maternal Grandmother    COPD Maternal Grandfather    Breast cancer Paternal Grandmother        dx in 51s; d. 82   Thyroid  cancer Sister        dx in 36s, 2nd time at 49   Breast cancer Sister 16       second at 69   Thyroid  cancer Other        dx late 84s; daughter of sister with thyroid  cancer    ALLERGIES:  is allergic to myrbetriq [mirabegron], augmentin  [amoxicillin-pot clavulanate], bupropion, duloxetine, ibuprofen, oxybutynin chloride, ozempic (0.25 or 0.5 mg-dose) [semaglutide(0.25 or 0.5mg -dos)], rosuvastatin, and trintellix [vortioxetine].  MEDICATIONS:  Current Outpatient Medications  Medication Sig Dispense Refill   acetaminophen  (TYLENOL ) 650 MG CR tablet Take 1,300 mg by mouth every 8 (eight) hours as needed for pain.     ALPRAZolam (XANAX) 0.5 MG tablet Take 0.25-0.5 mg by mouth at bedtime as needed for sleep.      Cholecalciferol (VITAMIN D3) 2000 units capsule Take 2,000 Units by mouth daily.     Cyanocobalamin (B-12 PO) Take 1,000 mcg by mouth daily.     FLUoxetine (PROZAC) 10 MG capsule Take 40 mg by mouth daily.     fluticasone (FLONASE) 50 MCG/ACT nasal spray Place 1 spray into both nostrils daily as needed for allergies or rhinitis.     ketoconazole (NIZORAL) 2 % cream Apply 1 Application topically daily as needed (itching).     metFORMIN (GLUCOPHAGE-XR) 500 MG 24 hr tablet Take 500 mg by mouth at bedtime.     pravastatin (PRAVACHOL) 10 MG tablet Take 10 mg by mouth 2 (two) times a week.     triamterene-hydrochlorothiazide  (MAXZIDE-25) 37.5-25 MG tablet Take 0.5 tablets by mouth every morning.     acetaminophen  (TYLENOL ) 500 MG tablet Take 2 tablets (1,000 mg total) by mouth every 8 (eight) hours as needed for mild pain (pain score 1-3). 30 tablet 0   naproxen  (NAPROSYN ) 500 MG tablet Take 1 tablet (500 mg total) by mouth 2 (two) times daily with a meal. 30 tablet 0   No current facility-administered medications for this visit.    REVIEW OF SYSTEMS:   Constitutional: Denies fevers, chills or abnormal night sweats Eyes: Denies blurriness of vision, double vision or watery eyes Ears, nose, mouth, throat, and face: Denies mucositis or sore throat Respiratory: Denies cough, dyspnea or wheezes Cardiovascular: Denies palpitation, chest discomfort or lower extremity swelling Gastrointestinal:  Denies nausea, heartburn or change  in bowel  habits Skin: Denies abnormal skin rashes Lymphatics: Denies new lymphadenopathy or easy bruising Neurological:Denies numbness, tingling or new weaknesses Behavioral/Psych: Mood is stable, no new changes  Breast: Denies any palpable lumps or discharge All other systems were reviewed with the patient and are negative.  PHYSICAL EXAMINATION: ECOG PERFORMANCE STATUS: 0 - Asymptomatic  Vitals:   06/29/23 0858  BP: (!) 147/54  Pulse: 84  Resp: 18  Temp: (!) 97.2 F (36.2 C)  SpO2: 95%   Filed Weights   06/29/23 0858  Weight: 283 lb (128.4 kg)    GENERAL:alert, no distress and comfortable NECK: supple, thyroid  normal size, non-tender, without nodularity LYMPH:  no palpable lymphadenopathy in the cervical, axillary  LUNGS: clear to auscultation and percussion with normal breathing effort HEART: regular rate & rhythm and no murmurs and no lower extremity edema ABDOMEN:abdomen soft, non-tender and normal bowel sounds Musculoskeletal:no cyanosis of digits and no clubbing  PSYCH: alert & oriented x 3 with fluent speech NEURO: no focal motor/sensory deficits BREAST: post biopsy changes in the breast.  LABORATORY DATA:  I have reviewed the data as listed Lab Results  Component Value Date   WBC 9.3 06/29/2023   HGB 14.3 06/29/2023   HCT 43.5 06/29/2023   MCV 89.7 06/29/2023   PLT 313 06/29/2023   Lab Results  Component Value Date   NA 136 06/29/2023   K 4.5 06/29/2023   CL 102 06/29/2023   CO2 27 06/29/2023    RADIOGRAPHIC STUDIES: I have personally reviewed the radiological reports and agreed with the findings in the report.  ASSESSMENT AND PLAN:  Malignant neoplasm of upper-outer quadrant of right breast in female, estrogen receptor positive (HCC) Assessment and Plan Assessment & Plan  This is a very pleasant 73 year old postmenopausal female patient with newly diagnosed right breast grade 1 IDC ER/PR positive, HER2 negative referred to breast MDC for  additional recommendations. We have reviewed details from the pathology, reviewed role of Oncotype DX and antiestrogen therapy.  Discussed the following details about Oncotype Dx antiestrogen therapy.We have discussed about Oncotype Dx score which is a well validated prognostic scoring system which can predict outcome with endocrine therapy alone and whether chemotherapy reduces recurrence.  Typically in patients with ER positive cancers that are node negative if the RS score is high typically greater than or equal to 26, chemotherapy is recommended.  We have discussed options for antiestrogen therapy today. With regards to Tamoxifen, we discussed that this is a SERM, selective estrogen receptor modulator. We discussed mechanism of action of Tamoxifen, adverse effects on Tamoxifen including but not limited to post menopausal symptoms, increased risk of DVT/PE, increased risk of endometrial cancer, questionable cataracts with long term use and increased risk of cardiovascular events in the study which was not statistically significant. A benefit from Tamoxifen would be improvement in bone density. With regards to aromatase inhibitors, we discussed mechanism of action, adverse effects including but not limited to post menopausal symptoms, arthralgias, myalgias, increased risk of cardiovascular events and bone loss.  Stage 1 ductal carcinoma, 6 mm, hormone receptor-positive, lymph nodes normal. - Conduct Oncotype testing to assess chemotherapy necessity. - Administer antiestrogen medication for five years when appropriate. - Monitor bone density due to potential bone loss with anti estrogen therapy. - Evaluate need for radiation therapy post-surgery.      All questions were answered. The patient knows to call the clinic with any problems, questions or concerns.    Murleen Arms, MD 06/29/23

## 2023-06-29 NOTE — Assessment & Plan Note (Signed)
 Assessment and Plan Assessment & Plan  This is a very pleasant 73 year old postmenopausal female patient with newly diagnosed right breast grade 1 IDC ER/PR positive, HER2 negative referred to breast MDC for additional recommendations. We have reviewed details from the pathology, reviewed role of Oncotype DX and antiestrogen therapy.  Discussed the following details about Oncotype Dx antiestrogen therapy.We have discussed about Oncotype Dx score which is a well validated prognostic scoring system which can predict outcome with endocrine therapy alone and whether chemotherapy reduces recurrence.  Typically in patients with ER positive cancers that are node negative if the RS score is high typically greater than or equal to 26, chemotherapy is recommended.  We have discussed options for antiestrogen therapy today. With regards to Tamoxifen, we discussed that this is a SERM, selective estrogen receptor modulator. We discussed mechanism of action of Tamoxifen, adverse effects on Tamoxifen including but not limited to post menopausal symptoms, increased risk of DVT/PE, increased risk of endometrial cancer, questionable cataracts with long term use and increased risk of cardiovascular events in the study which was not statistically significant. A benefit from Tamoxifen would be improvement in bone density. With regards to aromatase inhibitors, we discussed mechanism of action, adverse effects including but not limited to post menopausal symptoms, arthralgias, myalgias, increased risk of cardiovascular events and bone loss.  Stage 1 ductal carcinoma, 6 mm, hormone receptor-positive, lymph nodes normal. - Conduct Oncotype testing to assess chemotherapy necessity. - Administer antiestrogen medication for five years when appropriate. - Monitor bone density due to potential bone loss with anti estrogen therapy. - Evaluate need for radiation therapy post-surgery.

## 2023-06-29 NOTE — Progress Notes (Signed)
 [facility] Clinical Social Work  Initial Assessment   Tammy Mccoy is a 73 y.o. year old female contacted by phone. Clinical Social Work was referred by  [facility]  for assessment of psychosocial needs.   SDOH (Social Determinants of Health) assessments performed: Yes SDOH Interventions    Flowsheet Row Clinical Support from 06/29/2023 in [facility] - A Dept Of [facility]  SDOH Interventions   Food Insecurity Interventions Intervention Not Indicated  Housing Interventions Intervention Not Indicated  Transportation Interventions Intervention Not Indicated  Utilities Interventions Intervention Not Indicated       SDOH Screenings   Food Insecurity: No Food Insecurity (06/29/2023)  Housing: Low Risk  (06/29/2023)  Transportation Needs: No Transportation Needs (06/29/2023)  Utilities: Not At Risk (06/29/2023)  Depression (PHQ2-9): Low Risk  (06/29/2023)  Tobacco Use: Medium Risk (06/29/2023)   Received from [facility]     Distress Screen completed: No     No data to display            Family/Social Information:  Housing Arrangement: patient lives alone. Has adult children who live in [place] & [place] Family members/support persons in your life? Family Transportation concerns: no  Employment: Retired .  Income source: Actor concerns:  Potentially depending on medical costs Type of concern: Medical bills Food access concerns: no Advanced directives: Not known Services Currently in place:  [employer]; medication for depression & anxiety  Coping/ Adjustment to diagnosis: Patient understands treatment plan and what happens next? yes, feels positive after meeting with the medical team today Patient reported stressors: Adjusting to my illness Current coping skills/ strengths: Capable of independent living , Communication skills , and Supportive family/friends     SUMMARY: Current  SDOH Barriers:  Potential financial constraints with increased medical costs  Clinical Social Work Clinical Goal(s):  Explore community resource options for unmet needs related to:  Financial Strain   Interventions: Discussed common feeling and emotions when being diagnosed with cancer, and the importance of support during treatment Informed patient of the support team roles and support services at [facility] Provided CSW contact information and encouraged patient to call with any questions or concerns Provided patient with information about cancer foundations for financial assistance  (mailed today)   Follow Up Plan: Patient will contact CSW with any support or resource needs Patient verbalizes understanding of plan: Yes    Tammy Mccoy E Tammy Lavell, LCSW Clinical Social Worker [facility]

## 2023-06-29 NOTE — Research (Signed)
 [employer] 2021-05 - Specimen Collection Study to Evaluate Biomarkers in Subjects with Cancer   This Nurse has reviewed this patient's inclusion and exclusion criteria as a second review and confirms Tammy Mccoy is eligible for study participation.  Patient may continue with enrollment.  Aurora Lees, BSN, RN, [employer] Research Nurse II [phone] 06/29/2023

## 2023-06-29 NOTE — Progress Notes (Signed)
 Radiation Oncology         [phone] ________________________________  Multidisciplinary Breast Oncology Clinic [facility]) Initial Outpatient Consultation  Name: Tammy Mccoy MRN: [id number]  Date: 06/29/2023  DOB: June 27, 1950  CC:Tammy Grave, MD  [name], MD   REFERRING PHYSICIAN: [name], MD  DIAGNOSIS: The encounter diagnosis was Malignant neoplasm of upper-outer quadrant of right breast in female, estrogen receptor positive (HCC).  Stage IA (cT1b, cN0, cM0)  Right Breast UOQ, Invasive Ductal Carcinoma, ER+ / PR+ / Her2-, Grade 2    ICD-10-CM   1. Malignant neoplasm of upper-outer quadrant of right breast in female, estrogen receptor positive (HCC)  C50.411    Z17.0       HISTORY OF PRESENT ILLNESS::Tammy Mccoy is a 73 y.o. female who is presenting to the office today for evaluation of her newly diagnosed breast cancer. She is accompanied by her daughter. She is doing well overall.   She had routine screening mammography on 05/30/23 showing a possible abnormality in the right breast. She underwent a right breast diagnostic mammography with tomography and right breast ultrasonography at [facility] on 06/06/23 showing: a 0.5 cm oval mass in the upper outer right breast mammographically and a suspicious 0.6 cm mass in the 12 o'clock right breast sonographically, located 12 cmfn, correlating with mammographic findings. No evidence of right axillary lymphadenopathy was demonstrated.   Biopsy of the 12 o'clock right breast mass on 06/14/23 showed: grade 2 invasive ductal carcinoma measuring 4 mm in the greatest linear extent of the sample. Prognostic indicators significant for: estrogen receptor, 90% positive with strong staining intensity and progesterone receptor, 20% positive with moderate-strong staining intensity. Proliferation marker Ki67 at 20%. HER2 negative.  Menarche: 73 years old Age at first live birth: 73 years old GP: 2 LMP: around the age of 40  (approximately 20 years ago) Contraceptive: yes, BC pills for several years in her 40's HRT: never used    The patient was referred today for presentation in the multidisciplinary conference.  Radiology studies and pathology slides were presented there for review and discussion of treatment options.  A consensus was discussed regarding potential next steps.  PREVIOUS RADIATION THERAPY: No  PAST MEDICAL HISTORY:  Past Medical History:  Diagnosis Date   Alcohol abuse    pt stopped drinking alcohol around 2009   Anxiety    Arthritis    bil knees and neck   Asthma    excersice   B12 deficiency    Back pain    Breast cancer (HCC)    Claustrophobia    Claustrophobia    Colon polyps    Constipation    COPD (chronic obstructive pulmonary disease) (HCC)    Depression    Difficult intubation 06/11/2016   glidescope grade 2, pt states there was no difficulty with airway during cholecystectomy   Diverticulitis    Family history of adverse reaction to anesthesia    sister has vomiting   Family history of bladder cancer    Family history of breast cancer    Family history of colon cancer    Family history of thyroid  cancer    H/O rosacea    Hyperparathyroidism (HCC)    Hypertension    Joint pain    Multiple thyroid  nodules    Multiple thyroid  nodules    benign has checked yearly   Multiple thyroid  nodules    benign per pt   Palpitations    Pre-diabetes    Skin cancer, basal cell  Sleep apnea    no cpap   SOB (shortness of breath)    Squamous cell skin cancer    Swelling of both lower extremities    Thyroid  disease    Vitamin D deficiency     PAST SURGICAL HISTORY: Past Surgical History:  Procedure Laterality Date   CARPAL TUNNEL RELEASE     Bilateral   CESAREAN SECTION     x 2   CHOLECYSTECTOMY     ENDOVENOUS ABLATION SAPHENOUS VEIN W/ LASER Left 11/16/2016   endovenous laser ablation L GSV and stab phlebectomy > 20 incisions left leg by Merced Stair MD     PARATHYROIDECTOMY Left 06/11/2016   Procedure: LEFT PARATHYROIDECTOMY;  Surgeon: Oralee Billow, MD;  Location: [facility] ORS;  Service: General;  Laterality: Left;   TONSILLECTOMY AND ADENOIDECTOMY     TUBAL LIGATION     VULVECTOMY N/A 02/16/2023   Procedure: Dannielle Dux;  Surgeon: Arlee Lace, MD;  Location: [facility] OR;  Service: Gynecology;  Laterality: N/A;    FAMILY HISTORY:  Family History  Problem Relation Age of Onset   Cancer Father        Bladder   Depression Father    Alcoholism Father    CVA Mother 27   Thyroid  cancer Mother        dx in her 28s   Colon cancer Mother        dx in her 9s, d. 58   Lung cancer Mother        dx in 49s   Cancer Mother        parotid gland cancer, d. 59   Stroke Mother    Depression Mother    Anxiety disorder Mother    Alcoholism Mother    Hyperparathyroidism Sister    Diabetes Maternal Grandmother    COPD Maternal Grandfather    Breast cancer Paternal Grandmother        dx in 51s; d. 23   Thyroid  cancer Sister        dx in 11s, 2nd time at 54   Breast cancer Sister 28       second at 72   Thyroid  cancer Other        dx late 24s; daughter of sister with thyroid  cancer    SOCIAL HISTORY:  Social History   Socioeconomic History   Marital status: Widowed    Spouse name: Not on file   Number of children: 2   Years of education: Not on file   Highest education level: Not on file  Occupational History   Occupation: retired  Tobacco Use   Smoking status: Former    Current packs/day: 0.00    Average packs/day: 1 pack/day for 38.0 years (38.0 ttl pk-yrs)    Types: Cigarettes    Start date: 03/01/1970    Quit date: 03/01/2008    Years since quitting: 15.3   Smokeless tobacco: Never  Vaping Use   Vaping status: Never Used  Substance and Sexual Activity   Alcohol use: No    Comment: pt quit drinking around 2009   Drug use: No   Sexual activity: Not Currently  Other Topics Concern   Not on file  Social History Narrative   Used to drink  alcohol.  None in the last 10 years.   Son lives with her.    She has two grands.  She helps take care of them.    Social Drivers of Corporate investment banker Strain: Not on file  Food Insecurity: Not on file  Transportation Needs: Not on file  Physical Activity: Not on file  Stress: Not on file  Social Connections: Not on file    ALLERGIES:  Allergies  Allergen Reactions   Myrbetriq [Mirabegron] Shortness Of Breath and Swelling    Other reaction(s): SOB and swelling in feet   Augmentin [Amoxicillin-Pot Clavulanate] Hives   Bupropion     Worsened depression    Duloxetine Other (See Comments)    Other reaction(s): more depressed at high dose   Ibuprofen Itching and Swelling    Lower lip swelling    Oxybutynin Chloride Other (See Comments)    Did not help   Ozempic (0.25 Or 0.5 Mg-Dose) [Semaglutide(0.25 Or 0.5mg -Dos)] Diarrhea   Rosuvastatin     Other reaction(s): leg pain, Other (See Comments)   Trintellix [Vortioxetine] Other (See Comments)    Increased anxiety    MEDICATIONS:  Current Outpatient Medications  Medication Sig Dispense Refill   acetaminophen  (TYLENOL ) 500 MG tablet Take 2 tablets (1,000 mg total) by mouth every 8 (eight) hours as needed for mild pain (pain score 1-3). 30 tablet 0   acetaminophen  (TYLENOL ) 650 MG CR tablet Take 1,300 mg by mouth every 8 (eight) hours as needed for pain.     ALPRAZolam (XANAX) 0.5 MG tablet Take 0.25-0.5 mg by mouth at bedtime as needed for sleep.      Cholecalciferol (VITAMIN D3) 2000 units capsule Take 2,000 Units by mouth daily.     Cyanocobalamin (B-12 PO) Take 1,000 mcg by mouth daily.     FLUoxetine (PROZAC) 10 MG capsule Take 40 mg by mouth daily.     fluticasone (FLONASE) 50 MCG/ACT nasal spray Place 1 spray into both nostrils daily as needed for allergies or rhinitis.     ketoconazole (NIZORAL) 2 % cream Apply 1 Application topically daily as needed (itching).     metFORMIN (GLUCOPHAGE-XR) 500 MG 24 hr tablet Take  500 mg by mouth at bedtime.     naproxen  (NAPROSYN ) 500 MG tablet Take 1 tablet (500 mg total) by mouth 2 (two) times daily with a meal. 30 tablet 0   pravastatin (PRAVACHOL) 10 MG tablet Take 10 mg by mouth 2 (two) times a week.     triamterene-hydrochlorothiazide  (MAXZIDE-25) 37.5-25 MG tablet Take 0.5 tablets by mouth every morning.     No current facility-administered medications for this encounter.    REVIEW OF SYSTEMS: A 10+ POINT REVIEW OF SYSTEMS WAS OBTAINED including neurology, dermatology, psychiatry, cardiac, respiratory, lymph, extremities, GI, GU, musculoskeletal, constitutional, reproductive, HEENT. On the provided form, she reports weight changes, loss of sleep, fatigue (which does not effect her ADL's), leg and back pain that she attributes to arthritis, wearing glasses, runny nose, SOB with walking and stairs, sleeping with 2 pillows, dribbling urine, incontinence, a history of skin cancer, joint pain, arthritis, difficulties walking, anxiety, depression, and a history of thyroid  nodules. She also reports being pre-diabetic. She denies any other symptoms.    PHYSICAL EXAM:     06/29/2023  Vitals with BMI   Height 5' 5.354"   Weight 283 lbs   BMI 46.58   Systolic 147 !   Diastolic 54 !   Pulse 84       Lungs are clear to auscultation bilaterally. Heart has regular rate and rhythm. No palpable cervical, supraclavicular, or axillary adenopathy. Abdomen soft, non-tender, normal bowel sounds. Breast: Left breast with no palpable mass, nipple discharge, or bleeding. Right breast with biopsy site in the  upper aspect of the breast with no dominant palpable mass, nipple discharge or bleeding appreciated.    KPS = 80  100 - Normal; no complaints; no evidence of disease. 90   - Able to carry on normal activity; minor signs or symptoms of disease. 80   - Normal activity with effort; some signs or symptoms of disease. 82   - Cares for self; unable to carry on normal activity or to  do active work. 60   - Requires occasional assistance, but is able to care for most of his personal needs. 50   - Requires considerable assistance and frequent medical care. 40   - Disabled; requires special care and assistance. 30   - Severely disabled; hospital admission is indicated although death not imminent. 20   - Very sick; hospital admission necessary; active supportive treatment necessary. 10   - Moribund; fatal processes progressing rapidly. 0     - Dead  Karnofsky DA, Abelmann WH, Craver LS and Burchenal JH [phone]) The use of the nitrogen mustards in the palliative treatment of carcinoma: with particular reference to bronchogenic carcinoma Cancer 1 634-56  LABORATORY DATA:  Lab Results  Component Value Date   WBC 9.3 06/29/2023   HGB 14.3 06/29/2023   HCT 43.5 06/29/2023   MCV 89.7 06/29/2023   PLT 313 06/29/2023   Lab Results  Component Value Date   NA 136 06/29/2023   K 4.5 06/29/2023   CL 102 06/29/2023   CO2 27 06/29/2023   Lab Results  Component Value Date   ALT 16 06/29/2023   AST 19 06/29/2023   ALKPHOS 74 06/29/2023   BILITOT 0.6 06/29/2023    PULMONARY FUNCTION TEST:   Review Flowsheet        No data to display          RADIOGRAPHY: No results found.    IMPRESSION: Stage IA (cT1b, cN0, cM0)  Right Breast UOQ, Invasive Ductal Carcinoma, ER+ / PR+ / Her2-, Grade 2  Patient will be a good candidate for breast conservation with radiotherapy to the right breast. We discussed the general course of radiation, potential side effects, and toxicities with radiation and the patient is interested in this approach.   We discussed the potential benefit of radiation therapy in addition to adjuvant hormonal therapy. Assuming her pathology does not change after her surgery, we discussed that radiation does not provide a survival benefit but does benefit in terms of local control issues.   She would like to meet in person after surgery to further discuss the  potential benefit of radiation.   PLAN:  Right breast lumpectomy  Oncotype  +/- Adjuvant radiation therapy Aromatase inhibitor    ------------------------------------------------  Noralee Beam, PhD, MD  This document serves as a record of services personally performed by Retta Caster, MD. It was created on his behalf by Aleta Anda, a trained medical scribe. The creation of this record is based on the scribe's personal observations and the provider's statements to them. This document has been checked and approved by the attending provider.

## 2023-06-29 NOTE — Research (Addendum)
 [employer] 2021-05 - Specimen Collection Study to Evaluate Biomarkers in Subjects with Cancer     Patient Tammy Mccoy was identified by Dr. Arno Bibles as a potential candidate for the above listed study.  This Clinical Research Coordinator met with Tammy Mccoy, [id number], on 06/29/23 in a manner and location that ensures patient privacy to discuss participation in the above listed research study.  Patient is Accompanied by her daughter .  A copy of the informed consent document with embedded HIPAA language was provided to the patient.  Patient reads, speaks, and understands [place].    Patient was provided with the business card of this Coordinator and encouraged to contact the research team with any questions.  Patient was provided the option of taking informed consent documents home to review and was encouraged to review at their convenience with their support network, including other care providers. Patient is comfortable with making a decision regarding study participation today.  As outlined in the informed consent form, this Coordinator and Cena Douty discussed the purpose of the research study, the investigational nature of the study, study procedures and requirements for study participation, potential risks and benefits of study participation, as well as alternatives to participation. This study is not blinded. The patient understands participation is voluntary and they may withdraw from study participation at any time.  This study does not involve randomization.  This study does not involve an investigational drug or device. This study does not involve a placebo. Patient understands enrollment is pending full eligibility review.   Confidentiality and how the patient's information will be used as part of study participation were discussed.  Patient was informed there is reimbursement provided for their time and effort spent on trial participation.    All questions were  answered to patient's satisfaction.  The informed consent with embedded HIPAA language was reviewed page by page.  The patient's mental and emotional status is appropriate to provide informed consent, and the patient verbalizes an understanding of study participation.  Patient has agreed to participate in the above listed research study and has voluntarily signed the informed consent version 14 Mar 2020 [Revised 30 Mar 2021] with embedded HIPAA language, version version 14 Mar 2020 (Revised 30 Mar 2021)  on 06/29/23 at 10:25AM.  The patient was provided with a copy of the signed informed consent form with embedded HIPAA language for their reference.  No study specific procedures were obtained prior to the signing of the informed consent document.  Approximately 20 minutes were spent with the patient reviewing the informed consent documents.  Patient was not requested to complete a Release of Information form.   Eligibility: Eligibility criteria reviewed with patient. This coordinator has reviewed this patient's inclusion and exclusion criteria and confirmed patient is eligible for study participation. Eligibility confirmed by treating investigator, who also agrees that patient should proceed with enrollment. Patient will continue with enrollment.  Data Collection: Patient was interviewed to collect the following information.  Medical History:  High Blood Pressure  Yes Coronary Artery Disease No Lupus    No Rheumatoid Arthritis  Yes Diabetes   No      Lynch Syndrome  No  Is the patient currently taking a magnesium supplement?   No Does the patient have a personal history of cancer (greater than 5 years ago)?  No  Does the patient have a family history of cancer in 1st or 2nd degree relatives? Yes If yes, Relationship(s) and Cancer type(s)?  Mother: Thyroid , colon cancer, lung cancer, tongue  cancer Father: Bladder cancer Sister: Thyroid  cancer, breast cancer Niece (sister's daughter):  Thyroid  Paternal grandmother: Breast cancer  Does the patient have history of alcohol consumption? Yes   If yes, current or former? Former If former, year stopped? 2010 Number of years? 39 Drinks per week? 12 per week  Does the patient have history of cigarette, cigar, pipe, or chewing tobacco use?  Yes  If yes, current for former? Former If yes, type (Cigarette, cigar, pipe, and/or chewing tobacco)? Cigarette   If former, year stopped? 2010 Number of years? 39 Packs/number/containers per day? One pack  Blood Collection: Research blood obtained by [facility] venipuncture per patient's preference. Patient tolerated well without any adverse events.  Gift Card: $50 gift card given to patient for her participation in this study.    Patient was thanked for her time and participation in this study. Patient was encouraged to reach out research staff with any questions or concerns regarding the study.   Shyna Duignan, Ph.D. Clinical Research Coordinator [phone] 06/29/23 2:26 PM

## 2023-06-30 DIAGNOSIS — C50919 Malignant neoplasm of unspecified site of unspecified female breast: Secondary | ICD-10-CM

## 2023-06-30 HISTORY — DX: Malignant neoplasm of unspecified site of unspecified female breast: C50.919

## 2023-07-01 ENCOUNTER — Telehealth: Payer: Self-pay | Admitting: Genetic Counselor

## 2023-07-01 NOTE — Telephone Encounter (Signed)
 I contacted Tammy Mccoy and let her know that her blood was drawn for genetic testing after [facility] 2, but we will have the lab discard her sample because she already had negative genetic testing through [facility] (83 genes) in 2019. She appreciated the call and consented to having her blood discarded.  [name], MS, [facility] Genetic Counselor [name] (P) [phone]

## 2023-07-04 ENCOUNTER — Inpatient Hospital Stay: Admission: RE | Admit: 2023-07-04 | Source: Ambulatory Visit

## 2023-07-05 ENCOUNTER — Other Ambulatory Visit: Payer: Self-pay

## 2023-07-05 DIAGNOSIS — Z87891 Personal history of nicotine dependence: Secondary | ICD-10-CM

## 2023-07-05 DIAGNOSIS — Z122 Encounter for screening for malignant neoplasm of respiratory organs: Secondary | ICD-10-CM

## 2023-07-07 ENCOUNTER — Encounter: Payer: Self-pay | Admitting: *Deleted

## 2023-07-07 ENCOUNTER — Ambulatory Visit: Attending: Surgery

## 2023-07-07 ENCOUNTER — Telehealth: Payer: Self-pay | Admitting: *Deleted

## 2023-07-07 ENCOUNTER — Other Ambulatory Visit: Payer: Self-pay

## 2023-07-07 DIAGNOSIS — M25661 Stiffness of right knee, not elsewhere classified: Secondary | ICD-10-CM | POA: Diagnosis not present

## 2023-07-07 DIAGNOSIS — M6281 Muscle weakness (generalized): Secondary | ICD-10-CM | POA: Insufficient documentation

## 2023-07-07 DIAGNOSIS — M25662 Stiffness of left knee, not elsewhere classified: Secondary | ICD-10-CM | POA: Diagnosis not present

## 2023-07-07 DIAGNOSIS — G8929 Other chronic pain: Secondary | ICD-10-CM | POA: Insufficient documentation

## 2023-07-07 DIAGNOSIS — R262 Difficulty in walking, not elsewhere classified: Secondary | ICD-10-CM | POA: Insufficient documentation

## 2023-07-07 DIAGNOSIS — M25561 Pain in right knee: Secondary | ICD-10-CM | POA: Insufficient documentation

## 2023-07-07 DIAGNOSIS — R252 Cramp and spasm: Secondary | ICD-10-CM | POA: Insufficient documentation

## 2023-07-07 DIAGNOSIS — M25562 Pain in left knee: Secondary | ICD-10-CM | POA: Diagnosis not present

## 2023-07-07 NOTE — Telephone Encounter (Signed)
 Left vm regarding [facility] from 06/29/23. Contact information provided for questions or needs.

## 2023-07-07 NOTE — Therapy (Signed)
 OUTPATIENT PHYSICAL THERAPY LOWER EXTREMITY EVALUATION   Patient Name: Tammy Mccoy MRN: [id number] DOB:1951/01/22, 73 y.o., female Today's Date: 07/07/2023  END OF SESSION:  PT End of Session - 07/07/23 0853     Visit Number 1    Date for PT Re-Evaluation 09/01/23    Authorization Type [employer]    PT Start Time 0845    PT Stop Time 0930    PT Time Calculation (min) 45 min    Activity Tolerance Patient tolerated treatment well    Behavior During Therapy [facility] for tasks assessed/performed             Past Medical History:  Diagnosis Date   Alcohol abuse    pt stopped drinking alcohol around 2009   Anxiety    Arthritis    bil knees and neck   Asthma    excersice   B12 deficiency    Back pain    Breast cancer (HCC)    Claustrophobia    Claustrophobia    Colon polyps    Constipation    COPD (chronic obstructive pulmonary disease) (HCC)    Depression    Difficult intubation 06/11/2016   glidescope grade 2, pt states there was no difficulty with airway during cholecystectomy   Diverticulitis    Family history of adverse reaction to anesthesia    sister has vomiting   Family history of bladder cancer    Family history of breast cancer    Family history of colon cancer    Family history of thyroid  cancer    H/O rosacea    Hyperparathyroidism (HCC)    Hypertension    Joint pain    Multiple thyroid  nodules    Multiple thyroid  nodules    benign has checked yearly   Multiple thyroid  nodules    benign per pt   Palpitations    Pre-diabetes    Skin cancer, basal cell    Sleep apnea    no cpap   SOB (shortness of breath)    Squamous cell skin cancer    Swelling of both lower extremities    Thyroid  disease    Vitamin D deficiency    Past Surgical History:  Procedure Laterality Date   CARPAL TUNNEL RELEASE     Bilateral   CESAREAN SECTION     x 2   CHOLECYSTECTOMY     ENDOVENOUS ABLATION SAPHENOUS VEIN W/ LASER Left 11/16/2016   endovenous  laser ablation L GSV and stab phlebectomy > 20 incisions left leg by Merced Stair MD    PARATHYROIDECTOMY Left 06/11/2016   Procedure: LEFT PARATHYROIDECTOMY;  Surgeon: Oralee Billow, MD;  Location: [facility];  Service: General;  Laterality: Left;   TONSILLECTOMY AND ADENOIDECTOMY     TUBAL LIGATION     VULVECTOMY N/A 02/16/2023   Procedure: Dannielle Dux;  Surgeon: Arlee Lace, MD;  Location: [facility] OR;  Service: Gynecology;  Laterality: N/A;   Patient Active Problem List   Diagnosis Date Noted   Malignant neoplasm of upper-outer quadrant of right breast in female, estrogen receptor positive (HCC) 06/27/2023   Genetic testing 05/26/2017   Family history of breast cancer    Family history of thyroid  cancer    Family history of colon cancer    Family history of bladder cancer    Hyperparathyroidism (HCC)    Colon polyps    Multiple thyroid  nodules 06/07/2016   Hyperparathyroidism, primary (HCC) 06/07/2016   Varicose veins of left lower extremity with complications 05/24/2016   OSA (obstructive  sleep apnea) 08/29/2013   Chest pain 04/10/2013    PCP: [name], MD  REFERRING PROVIDER: [name], MD  REFERRING DIAG:  Diagnosis  M25.561,M25.562,G89.29 (ICD-10-CM) - Chronic pain of both knees    THERAPY DIAG:  Chronic pain of right knee  Chronic pain of left knee  Stiffness of left knee, not elsewhere classified  Stiffness of right knee, not elsewhere classified  Difficulty in walking, not elsewhere classified  Muscle weakness (generalized)  Cramp and spasm  Rationale for Evaluation and Treatment: Rehabilitation  ONSET DATE: 06/29/2023  SUBJECTIVE:   SUBJECTIVE STATEMENT: Patient reports several years of bilateral knee pain right > left.  Right knee with Bakers cyst.  Has had many injections and two rounds of visco.  Pain prevents her from being up on her feet for any longer than getting out of her car and walking into a building for an appt.  She loves to shop  including farmers market but she has not been able to do this.  She is also unable to do her housework.  She has 3 grandkids and enjoys going to watch their sporting events but has had to limit going lately.  She also enjoys working out in her yard but has not been able to do this.  She hopes to be able to exercise and lose weight to be considered a candidate for TKA.    PERTINENT HISTORY: na PAIN:  Are you having pain? Yes: NPRS scale: 8/10 Pain location: right knee > left Pain description: aching Aggravating factors: walking Relieving factors: rest  PRECAUTIONS: None  RED FLAGS: None   WEIGHT BEARING RESTRICTIONS: No  FALLS:  Has patient fallen in last 6 months? No  LIVING ENVIRONMENT: Lives with: lives with their spouse Lives in: House/apartment Stairs: Yes: Internal: 12 steps; on right going up and External: 4 steps; on right going up Has following equipment at home: None  OCCUPATION: Retired from Art therapist  PLOF: Independent, Independent with basic ADLs, Independent with household mobility without device, Independent with community mobility without device, Independent with homemaking with ambulation, Independent with gait, and Independent with transfers  PATIENT GOALS: She hopes to be able to exercise and lose weight to be considered a candidate for TKA.   NEXT MD VISIT: prn  OBJECTIVE:  Note: Objective measures were completed at Evaluation unless otherwise noted.  DIAGNOSTIC FINDINGS: na  PATIENT SURVEYS:  LEFS 13/80= 16.3%  COGNITION: Overall cognitive status: Within functional limits for tasks assessed     SENSATION: WFL   POSTURE: LE external hip/femoral external rotation, bilateral foot eversion and external tibial torsion  PALPATION: Mild to mod crepitus on seated bilateral knee ext/flex  LOWER EXTREMITY ROM:  Active ROM Right eval Left eval  Hip flexion    Hip extension    Hip abduction    Hip adduction    Hip internal rotation     Hip external rotation    Knee flexion 92 94  Knee extension -14 0  Ankle dorsiflexion    Ankle plantarflexion    Ankle inversion    Ankle eversion     (Blank rows = not tested)  LOWER EXTREMITY MMT:  Generally 3+ to 4-/5 bilateral LE's   FUNCTIONAL TESTS:  5 times sit to stand: 18.30 sec Timed up and go (TUG): 14.35 sec  GAIT: Distance walked: 30 feet Assistive device utilized: None Level of assistance: Modified independence Comments: antalgic, guarded, short step length with limited heel strike and knee extension on heel strike  TREATMENT DATE: 07/07/23 Initial eval completed and initiated HEP  Educated patient on postural abnormalities, proper foot wear, anatomy of the knee/ patellofemoral joint, advantage of hydrostatic buoyancy in the water    PATIENT EDUCATION:  Education details: Initiated HEP, educated on importance of gaining knee extension, see above Person educated: Patient Education method: Programmer, multimedia, Facilities manager, Verbal cues, and Handouts Education comprehension: verbalized understanding, returned demonstration, and verbal cues required  HOME EXERCISE PROGRAM: Access Code: [email or web address] URL: [email or web address] Date: 07/07/2023 Prepared by: Aletha Anderson  Exercises - Seated Knee Extension Stretch with Chair  - 1 x daily - 7 x weekly - 1 sets - 10 reps - Supine Heel Slide  - 1 x daily - 7 x weekly - 2 sets - 10 reps - Supine Hip Abduction  - 1 x daily - 7 x weekly - 2 sets - 10 reps - Small Range Straight Leg Raise  - 1 x daily - 7 x weekly - 2 sets - 10 reps  ASSESSMENT:  CLINICAL IMPRESSION: Patient is a 73 y.o. female who was seen today for physical therapy evaluation and treatment for bilateral knee pain due to end stage OA.  She presents with decreased ROM, strength and function along with elevated pain.   She would benefit from quad rehab, hip strengthening and stability, aquatics therapy and modalities to control pain.     OBJECTIVE IMPAIRMENTS: Abnormal gait, decreased balance, difficulty walking, decreased ROM, decreased strength, increased edema, increased fascial restrictions, increased muscle spasms, impaired flexibility, postural dysfunction, obesity, and pain.   ACTIVITY LIMITATIONS: carrying, lifting, bending, standing, squatting, sleeping, stairs, transfers, bed mobility, bathing, toileting, dressing, and caring for others  PARTICIPATION LIMITATIONS: meal prep, cleaning, laundry, driving, shopping, community activity, and yard work  PERSONAL FACTORS: Fitness and 1-2 comorbidities: COPD, depression/anxiety are also affecting patient's functional outcome.   REHAB POTENTIAL: Fair due to multiple co-morbidities  CLINICAL DECISION MAKING: Evolving/moderate complexity  EVALUATION COMPLEXITY: Moderate   GOALS: Goals reviewed with patient? Yes  SHORT TERM GOALS: Target date: 08/04/2023  Pain report to be no greater than 4/10  Baseline: Goal status: INITIAL  2.  Patient will be independent with initial HEP  Baseline:  Goal status: INITIAL   LONG TERM GOALS: Target date: 09/01/2023  Patient to report pain no greater than 2/10  Baseline:  Goal status: INITIAL  2.  Patient to be independent with advanced HEP  Baseline:  Goal status: INITIAL  3.  LEFS to be 50% or better Baseline:  Goal status: INITIAL  4.  Patient to be able to ascend and descend steps without pain or no greater than 2/10  Baseline:  Goal status: INITIAL  5.  Patient to be able to stand or walk for at least 15 min without leg pain  Baseline:  Goal status: INITIAL  6.  Patient to be able to bend, stoop and squat with pain no greater than 2/10  Baseline:  Goal status: INITIAL   PLAN:  PT FREQUENCY: 1-2x/week  PT DURATION: 8 weeks  PLANNED INTERVENTIONS: 97110-Therapeutic exercises, 97530-  Therapeutic activity, [id number]- Neuromuscular re-education, 97535- Self Care, 16109- Manual therapy, [id number]- Gait training, [phone]- Aquatic Therapy, [phone]- Electrical stimulation (unattended), [phone]- Electrical stimulation (manual), 97016- Vasopneumatic device, [id number]- Ultrasound, [id number]- Ionotophoresis 4mg /ml Dexamethasone , Patient/Family education, Balance training, Stair training, Taping, Dry Needling, Joint mobilization, Vestibular training, Visual/preceptual remediation/compensation, Cryotherapy, and Moist heat  PLAN FOR NEXT SESSION: Nustep, begin quad rehab and hip strengthening, alignment training.    Bridgette Campus B. Zaccheus Edmister, PT 07/07/23 8:45 PM [facility] [address], [address] [place], [place] [address] Phone # [phone] Fax [phone]

## 2023-07-13 ENCOUNTER — Encounter: Payer: Self-pay | Admitting: *Deleted

## 2023-07-14 DIAGNOSIS — M170 Bilateral primary osteoarthritis of knee: Secondary | ICD-10-CM | POA: Diagnosis not present

## 2023-07-14 DIAGNOSIS — M1711 Unilateral primary osteoarthritis, right knee: Secondary | ICD-10-CM | POA: Diagnosis not present

## 2023-07-18 ENCOUNTER — Ambulatory Visit
Admission: RE | Admit: 2023-07-18 | Discharge: 2023-07-18 | Disposition: A | Discharge status: Home / Self Care | Source: Ambulatory Visit | Attending: Family Medicine | Admitting: Family Medicine

## 2023-07-18 DIAGNOSIS — Z122 Encounter for screening for malignant neoplasm of respiratory organs: Secondary | ICD-10-CM

## 2023-07-18 DIAGNOSIS — Z87891 Personal history of nicotine dependence: Secondary | ICD-10-CM | POA: Diagnosis not present

## 2023-07-20 ENCOUNTER — Ambulatory Visit: Payer: Self-pay | Admitting: Physical Therapy

## 2023-07-22 ENCOUNTER — Ambulatory Visit: Admitting: Rehabilitative and Restorative Service Providers"

## 2023-07-22 ENCOUNTER — Encounter: Payer: Self-pay | Admitting: Rehabilitative and Restorative Service Providers"

## 2023-07-22 DIAGNOSIS — G8929 Other chronic pain: Secondary | ICD-10-CM

## 2023-07-22 DIAGNOSIS — M6281 Muscle weakness (generalized): Secondary | ICD-10-CM

## 2023-07-22 DIAGNOSIS — R262 Difficulty in walking, not elsewhere classified: Secondary | ICD-10-CM

## 2023-07-22 DIAGNOSIS — R252 Cramp and spasm: Secondary | ICD-10-CM

## 2023-07-22 DIAGNOSIS — M25661 Stiffness of right knee, not elsewhere classified: Secondary | ICD-10-CM

## 2023-07-22 DIAGNOSIS — M25662 Stiffness of left knee, not elsewhere classified: Secondary | ICD-10-CM

## 2023-07-22 DIAGNOSIS — M25561 Pain in right knee: Secondary | ICD-10-CM | POA: Diagnosis not present

## 2023-07-22 NOTE — Therapy (Signed)
 OUTPATIENT PHYSICAL THERAPY TREATMENT NOTE   Patient Name: Tammy Mccoy MRN: [id number] DOB:1951/02/17, 73 y.o., female Today's Date: 07/22/2023  END OF SESSION:  PT End of Session - 07/22/23 0844     Visit Number 2    Date for PT Re-Evaluation 09/01/23    Authorization Type [employer]    PT Start Time 0840    PT Stop Time 0920    PT Time Calculation (min) 40 min    Activity Tolerance Patient tolerated treatment well    Behavior During Therapy [facility] for tasks assessed/performed             Past Medical History:  Diagnosis Date   Alcohol abuse    pt stopped drinking alcohol around 2009   Anxiety    Arthritis    bil knees and neck   Asthma    excersice   B12 deficiency    Back pain    Breast cancer (HCC)    Claustrophobia    Claustrophobia    Colon polyps    Constipation    COPD (chronic obstructive pulmonary disease) (HCC)    Depression    Difficult intubation 06/11/2016   glidescope grade 2, pt states there was no difficulty with airway during cholecystectomy   Diverticulitis    Family history of adverse reaction to anesthesia    sister has vomiting   Family history of bladder cancer    Family history of breast cancer    Family history of colon cancer    Family history of thyroid  cancer    H/O rosacea    Hyperparathyroidism (HCC)    Hypertension    Joint pain    Multiple thyroid  nodules    Multiple thyroid  nodules    benign has checked yearly   Multiple thyroid  nodules    benign per pt   Palpitations    Pre-diabetes    Skin cancer, basal cell    Sleep apnea    no cpap   SOB (shortness of breath)    Squamous cell skin cancer    Swelling of both lower extremities    Thyroid  disease    Vitamin D deficiency    Past Surgical History:  Procedure Laterality Date   CARPAL TUNNEL RELEASE     Bilateral   CESAREAN SECTION     x 2   CHOLECYSTECTOMY     ENDOVENOUS ABLATION SAPHENOUS VEIN W/ LASER Left 11/16/2016   endovenous laser  ablation L GSV and stab phlebectomy > 20 incisions left leg by Merced Stair MD    PARATHYROIDECTOMY Left 06/11/2016   Procedure: LEFT PARATHYROIDECTOMY;  Surgeon: Oralee Billow, MD;  Location: [facility];  Service: General;  Laterality: Left;   TONSILLECTOMY AND ADENOIDECTOMY     TUBAL LIGATION     VULVECTOMY N/A 02/16/2023   Procedure: Dannielle Dux;  Surgeon: Arlee Lace, MD;  Location: [facility] OR;  Service: Gynecology;  Laterality: N/A;   Patient Active Problem List   Diagnosis Date Noted   Malignant neoplasm of upper-outer quadrant of right breast in female, estrogen receptor positive (HCC) 06/27/2023   Genetic testing 05/26/2017   Family history of breast cancer    Family history of thyroid  cancer    Family history of colon cancer    Family history of bladder cancer    Hyperparathyroidism (HCC)    Colon polyps    Multiple thyroid  nodules 06/07/2016   Hyperparathyroidism, primary (HCC) 06/07/2016   Varicose veins of left lower extremity with complications 05/24/2016   OSA (obstructive sleep  apnea) 08/29/2013   Chest pain 04/10/2013    PCP: [name], MD  REFERRING PROVIDER: [name], MD  REFERRING DIAG:  Diagnosis  M25.561,M25.562,G89.29 (ICD-10-CM) - Chronic pain of both knees    THERAPY DIAG:  Chronic pain of right knee  Chronic pain of left knee  Stiffness of left knee, not elsewhere classified  Stiffness of right knee, not elsewhere classified  Difficulty in walking, not elsewhere classified  Muscle weakness (generalized)  Cramp and spasm  Rationale for Evaluation and Treatment: Rehabilitation  ONSET DATE: 06/29/2023  SUBJECTIVE:   SUBJECTIVE STATEMENT: Patient reports that she had a knee injection last week.  Patient states that she was able to be more compliant with her exercises after the injection.  PERTINENT HISTORY: OA, breast cancer with planned right lumpectomy scheduled for 08/02/2023 (no lymph nodes removal expected), Right knee Baker's  Cyst  PAIN:  Are you having pain? Yes: NPRS scale: 5/10 Pain location: right knee > left Pain description: aching Aggravating factors: walking Relieving factors: rest  PRECAUTIONS: None  RED FLAGS: None   WEIGHT BEARING RESTRICTIONS: No  FALLS:  Has patient fallen in last 6 months? No  LIVING ENVIRONMENT: Lives with: lives with their spouse Lives in: House/apartment Stairs: Yes: Internal: 12 steps; on right going up and External: 4 steps; on right going up Has following equipment at home: None  OCCUPATION: Retired from Art therapist  PLOF: Independent, Independent with basic ADLs, Independent with household mobility without device, Independent with community mobility without device, Independent with homemaking with ambulation, Independent with gait, Independent with transfers, and Leisure: shopping at the [employer] and watching her 3 grandchildren's sporting events  PATIENT GOALS: She hopes to be able to exercise and lose weight to be considered a candidate for TKA.   NEXT MD VISIT: prn  OBJECTIVE:  Note: Objective measures were completed at Evaluation unless otherwise noted.  DIAGNOSTIC FINDINGS: na  PATIENT SURVEYS:  Eval:  LEFS 13/80= 16.3%  COGNITION: Overall cognitive status: Within functional limits for tasks assessed     SENSATION: WFL   POSTURE: LE external hip/femoral external rotation, bilateral foot eversion and external tibial torsion  PALPATION: Mild to mod crepitus on seated bilateral knee ext/flex  LOWER EXTREMITY ROM:  Active ROM Right eval Left eval  Hip flexion    Hip extension    Hip abduction    Hip adduction    Hip internal rotation    Hip external rotation    Knee flexion 92 94  Knee extension -14 0  Ankle dorsiflexion    Ankle plantarflexion    Ankle inversion    Ankle eversion     (Blank rows = not tested)  LOWER EXTREMITY MMT:  Generally 3+ to 4-/5 bilateral LE's   FUNCTIONAL TESTS:  Eval: 5 times  sit to stand: 18.30 sec Timed up and go (TUG): 14.35 sec  07/22/2023: 3 minute walk test:  431 ft with increased hip pain to 7/10  GAIT: Distance walked: 30 feet Assistive device utilized: None Level of assistance: Modified independence Comments: antalgic, guarded, short step length with limited heel strike and knee extension on heel strike  TREATMENT DATE:  07/22/2023: Nustep level 3 x6 min with PT present to discuss status Seated knee flexion/extension with foot on slider x20 bilat Seated hamstring stretch 2x20 sec bilat Seated long arc quad, marching, and heel/toe raises.  X10 each bilat Seated hip adduction ball squeeze 2x10 Seated hip abduction with blue loop 2x10 3 minute walk test:  431 ft with increased hip pain to 7/10 Quad sets x10 bilat with max cuing for technique (did not add to HEP yet)   07/07/23 Initial eval completed and initiated HEP  Educated patient on postural abnormalities, proper foot wear, anatomy of the knee/ patellofemoral joint, advantage of hydrostatic buoyancy in the water    PATIENT EDUCATION:  Education details: Initiated HEP, educated on importance of gaining knee extension, see above Person educated: Patient Education method: Programmer, multimedia, Facilities manager, Verbal cues, and Handouts Education comprehension: verbalized understanding, returned demonstration, and verbal cues required  HOME EXERCISE PROGRAM: Access Code: [email or web address] URL: [email or web address] Date: 07/22/2023 Prepared by: Robyne Christen  Exercises - Seated Knee Extension Stretch with Chair  - 1 x daily - 7 x weekly - 1 sets - 10 reps - Supine Heel Slide  - 1 x daily - 7 x weekly - 2 sets - 10 reps - Supine Hip Abduction  - 1 x daily - 7 x weekly - 2 sets - 10 reps - Small Range Straight Leg Raise  - 1 x daily - 7 x weekly - 2 sets - 10 reps -  Seated Hamstring Stretch  - 1 x daily - 7 x weekly - 2 reps - 20 sec hold - Seated March  - 1 x daily - 7 x weekly - 2 sets - 10 reps - Seated Long Arc Quad  - 1 x daily - 7 x weekly - 2 sets - 10 reps - Seated Hip Adduction Isometrics with Ball  - 1 x daily - 7 x weekly - 2 sets - 10 reps  ASSESSMENT:  CLINICAL IMPRESSION: Ms Manahan presents to skilled PT for first session after evaluation and reports that she has been doing some of her exercises, but admits that she did not do all of the exercises initially because of the pain.  Patient states that she had a knee injection last week and that has helped with the pain and she could start doing her exercises more.  Patient was able to participate in a 3 minute walk test today to help establish a baseline measurement.  Patient able to perform seated exercises during session today and provided with new handout for HEP.  Patient with great participation throughout and continues to require skilled PT to progress towards goal related activities.   OBJECTIVE IMPAIRMENTS: Abnormal gait, decreased balance, difficulty walking, decreased ROM, decreased strength, increased edema, increased fascial restrictions, increased muscle spasms, impaired flexibility, postural dysfunction, obesity, and pain.   ACTIVITY LIMITATIONS: carrying, lifting, bending, standing, squatting, sleeping, stairs, transfers, bed mobility, bathing, toileting, dressing, and caring for others  PARTICIPATION LIMITATIONS: meal prep, cleaning, laundry, driving, shopping, community activity, and yard work  PERSONAL FACTORS: Fitness and 1-2 comorbidities: COPD, depression/anxiety are also affecting patient's functional outcome.   REHAB POTENTIAL: Fair due to multiple co-morbidities  CLINICAL DECISION MAKING: Evolving/moderate complexity  EVALUATION COMPLEXITY: Moderate   GOALS: Goals reviewed with patient? Yes  SHORT TERM GOALS: Target date: 08/04/2023  Pain report to be no greater  than 4/10  Baseline: Goal status: Ongoing  2.  Patient will be independent with initial HEP  Baseline:  Goal status: Ongoing  LONG TERM GOALS: Target date: 09/01/2023  Patient to report pain no greater than 2/10  Baseline:  Goal status: INITIAL  2.  Patient to be independent with advanced HEP  Baseline:  Goal status: INITIAL  3.  LEFS to be 50% or better Baseline:  Goal status: INITIAL  4.  Patient to be able to ascend and descend steps without pain or no greater than 2/10  Baseline:  Goal status: INITIAL  5.  Patient to be able to stand or walk for at least 15 min without leg pain  Baseline:  Goal status: INITIAL  6.  Patient to be able to bend, stoop and squat with pain no greater than 2/10  Baseline:  Goal status: INITIAL   PLAN:  PT FREQUENCY: 1-2x/week  PT DURATION: 8 weeks  PLANNED INTERVENTIONS: 97110-Therapeutic exercises, 97530- Therapeutic activity, 97112- Neuromuscular re-education, 97535- Self Care, 40981- Manual therapy, [phone]- Gait training, [phone]- Aquatic Therapy, [phone]- Electrical stimulation (unattended), [phone]- Electrical stimulation (manual), 97016- Vasopneumatic device, [id number]- Ultrasound, [id number]- Ionotophoresis 4mg /ml Dexamethasone , Patient/Family education, Balance training, Stair training, Taping, Dry Needling, Joint mobilization, Vestibular training, Visual/preceptual remediation/compensation, Cryotherapy, and Moist heat  PLAN FOR NEXT SESSION: Assess and progress HEP as indicated, strengthening, flexibility, manual/dry needling as indicated    Robyne Christen, PT, DPT 07/22/23, 9:30 AM  [facility] [address] [place], [place] [address] Phone # [phone] Fax [phone]

## 2023-07-26 ENCOUNTER — Other Ambulatory Visit: Payer: Self-pay

## 2023-07-26 ENCOUNTER — Encounter (HOSPITAL_BASED_OUTPATIENT_CLINIC_OR_DEPARTMENT_OTHER): Payer: Self-pay | Admitting: Surgery

## 2023-07-27 ENCOUNTER — Ambulatory Visit: Payer: Self-pay | Admitting: Physical Therapy

## 2023-07-27 ENCOUNTER — Encounter: Payer: Self-pay | Admitting: Physical Therapy

## 2023-07-27 DIAGNOSIS — R262 Difficulty in walking, not elsewhere classified: Secondary | ICD-10-CM

## 2023-07-27 DIAGNOSIS — M25662 Stiffness of left knee, not elsewhere classified: Secondary | ICD-10-CM

## 2023-07-27 DIAGNOSIS — M6281 Muscle weakness (generalized): Secondary | ICD-10-CM

## 2023-07-27 DIAGNOSIS — M25661 Stiffness of right knee, not elsewhere classified: Secondary | ICD-10-CM

## 2023-07-27 DIAGNOSIS — M25561 Pain in right knee: Secondary | ICD-10-CM | POA: Diagnosis not present

## 2023-07-27 DIAGNOSIS — G8929 Other chronic pain: Secondary | ICD-10-CM

## 2023-07-27 DIAGNOSIS — R252 Cramp and spasm: Secondary | ICD-10-CM

## 2023-07-27 NOTE — Therapy (Signed)
 OUTPATIENT PHYSICAL THERAPY TREATMENT NOTE   Patient Name: Tammy Mccoy MRN: [id number] DOB:1950-05-11, 73 y.o., female Today's Date: 07/27/2023  END OF SESSION:  PT End of Session - 07/27/23 1002     Visit Number 3    Date for PT Re-Evaluation 09/01/23    Authorization Type [employer]    PT Start Time 1002    PT Stop Time 1055    PT Time Calculation (min) 53 min    Activity Tolerance Patient tolerated treatment well    Behavior During Therapy WFL for tasks assessed/performed              Past Medical History:  Diagnosis Date   Alcohol abuse    pt stopped drinking alcohol around 2009   Anxiety    Arthritis    bil knees and neck   Asthma    excersice   B12 deficiency    Back pain    Breast cancer (HCC) 06/2023   right breast IDC   Claustrophobia    Claustrophobia    Colon polyps    Constipation    COPD (chronic obstructive pulmonary disease) (HCC)    Depression    Diabetes mellitus without complication (HCC)    Difficult intubation 06/11/2016   glidescope grade 2, pt states there was no difficulty with airway during cholecystectomy   Diverticulitis    Family history of adverse reaction to anesthesia    sister has vomiting   Family history of bladder cancer    Family history of breast cancer    Family history of colon cancer    Family history of thyroid  cancer    Former smoker    H/O rosacea    Hyperlipidemia    Hyperparathyroidism (HCC)    Hypertension    Joint pain    Multiple thyroid  nodules    Multiple thyroid  nodules    benign has checked yearly   Multiple thyroid  nodules    benign per pt   Palpitations    Skin cancer, basal cell    Sleep apnea    no cpap   SOB (shortness of breath)    Squamous cell skin cancer    Swelling of both lower extremities    Thyroid  disease    Vitamin D deficiency    Past Surgical History:  Procedure Laterality Date   CARPAL TUNNEL RELEASE     Bilateral   CESAREAN SECTION     x 2    CHOLECYSTECTOMY     ENDOVENOUS ABLATION SAPHENOUS VEIN W/ LASER Left 11/16/2016   endovenous laser ablation L GSV and stab phlebectomy > 20 incisions left leg by Merced Stair MD    PARATHYROIDECTOMY Left 06/11/2016   Procedure: LEFT PARATHYROIDECTOMY;  Surgeon: Oralee Billow, MD;  Location: [facility];  Service: General;  Laterality: Left;   TONSILLECTOMY AND ADENOIDECTOMY     TUBAL LIGATION     VULVECTOMY N/A 02/16/2023   Procedure: Dannielle Dux;  Surgeon: Arlee Lace, MD;  Location: [facility] OR;  Service: Gynecology;  Laterality: N/A;   Patient Active Problem List   Diagnosis Date Noted   Malignant neoplasm of upper-outer quadrant of right breast in female, estrogen receptor positive (HCC) 06/27/2023   Genetic testing 05/26/2017   Family history of breast cancer    Family history of thyroid  cancer    Family history of colon cancer    Family history of bladder cancer    Hyperparathyroidism (HCC)    Colon polyps    Multiple thyroid  nodules 06/07/2016   Hyperparathyroidism,  primary (HCC) 06/07/2016   Varicose veins of left lower extremity with complications 05/24/2016   OSA (obstructive sleep apnea) 08/29/2013   Chest pain 04/10/2013    PCP: [name], MD  REFERRING PROVIDER: [name], MD  REFERRING DIAG:  Diagnosis  M25.561,M25.562,G89.29 (ICD-10-CM) - Chronic pain of both knees    THERAPY DIAG:  Chronic pain of right knee  Chronic pain of left knee  Stiffness of left knee, not elsewhere classified  Stiffness of right knee, not elsewhere classified  Difficulty in walking, not elsewhere classified  Muscle weakness (generalized)  Cramp and spasm  Rationale for Evaluation and Treatment: Rehabilitation  ONSET DATE: 06/29/2023  SUBJECTIVE:   SUBJECTIVE STATEMENT: Good ok today.Tuesday I have my lumpectomy.   PERTINENT HISTORY: OA, breast cancer with planned right lumpectomy scheduled for 08/02/2023 (no lymph nodes removal expected), Right knee Baker's Cyst  PAIN:   Are you having pain? Not right now  PRECAUTIONS: None  RED FLAGS: None   WEIGHT BEARING RESTRICTIONS: No  FALLS:  Has patient fallen in last 6 months? No  LIVING ENVIRONMENT: Lives with: lives with their spouse Lives in: House/apartment Stairs: Yes: Internal: 12 steps; on right going up and External: 4 steps; on right going up Has following equipment at home: None  OCCUPATION: Retired from Art therapist  PLOF: Independent, Independent with basic ADLs, Independent with household mobility without device, Independent with community mobility without device, Independent with homemaking with ambulation, Independent with gait, Independent with transfers, and Leisure: shopping at the [employer] and watching her 3 grandchildren's sporting events  PATIENT GOALS: She hopes to be able to exercise and lose weight to be considered a candidate for TKA.   NEXT MD VISIT: prn  OBJECTIVE:  Note: Objective measures were completed at Evaluation unless otherwise noted.  DIAGNOSTIC FINDINGS: na  PATIENT SURVEYS:  Eval:  LEFS 13/80= 16.3%  COGNITION: Overall cognitive status: Within functional limits for tasks assessed     SENSATION: WFL   POSTURE: LE external hip/femoral external rotation, bilateral foot eversion and external tibial torsion  PALPATION: Mild to mod crepitus on seated bilateral knee ext/flex  LOWER EXTREMITY ROM:  Active ROM Right eval Left eval  Hip flexion    Hip extension    Hip abduction    Hip adduction    Hip internal rotation    Hip external rotation    Knee flexion 92 94  Knee extension -14 0  Ankle dorsiflexion    Ankle plantarflexion    Ankle inversion    Ankle eversion     (Blank rows = not tested)  LOWER EXTREMITY MMT:  Generally 3+ to 4-/5 bilateral LE's   FUNCTIONAL TESTS:  Eval: 5 times sit to stand: 18.30 sec Timed up and go (TUG): 14.35 sec  07/22/2023: 3 minute walk test:  431 ft with increased hip pain to  7/10  GAIT: Distance walked: 30 feet Assistive device utilized: None Level of assistance: Modified independence Comments: antalgic, guarded, short step length with limited heel strike and knee extension on heel strike  TREATMENT DATE:   07/27/23:Pt arrives for aquatic physical therapy. Treatment took place in 3.5-5.5 feet of water. Water temperature was 91 degrees F. Pt entered the pool via stairs, descends diagonally holding railings. Pt requires buoyancy of water for support and to offload joints with strengthening exercises.  Seated water bench with 75% submersion Pt performed seated LE AROM exercises 20x in all planes, concurrent verbal education on water principles and how we would use them. Verbal understanding by pt. 75% depth water walking:4 lengths in each direction. Rest seated for knees. Wall ex inc: marching, heel lifts hip abd 10x Bil each. Seated decompression float with lrge noodle and PTA helping to manage the control 3 min.  07/22/2023: Nustep level 3 x6 min with PT present to discuss status Seated knee flexion/extension with foot on slider x20 bilat Seated hamstring stretch 2x20 sec bilat Seated long arc quad, marching, and heel/toe raises.  X10 each bilat Seated hip adduction ball squeeze 2x10 Seated hip abduction with blue loop 2x10 3 minute walk test:  431 ft with increased hip pain to 7/10 Quad sets x10 bilat with max cuing for technique (did not add to HEP yet)   07/07/23 Initial eval completed and initiated HEP  Educated patient on postural abnormalities, proper foot wear, anatomy of the knee/ patellofemoral joint, advantage of hydrostatic buoyancy in the water    PATIENT EDUCATION:  Education details: Initiated HEP, educated on importance of gaining knee extension, see above Person educated: Patient Education method: Programmer, multimedia,  Facilities manager, Verbal cues, and Handouts Education comprehension: verbalized understanding, returned demonstration, and verbal cues required  HOME EXERCISE PROGRAM: Access Code: [email or web address] URL: [email or web address] Date: 07/22/2023 Prepared by: Robyne Christen  Exercises - Seated Knee Extension Stretch with Chair  - 1 x daily - 7 x weekly - 1 sets - 10 reps - Supine Heel Slide  - 1 x daily - 7 x weekly - 2 sets - 10 reps - Supine Hip Abduction  - 1 x daily - 7 x weekly - 2 sets - 10 reps - Small Range Straight Leg Raise  - 1 x daily - 7 x weekly - 2 sets - 10 reps - Seated Hamstring Stretch  - 1 x daily - 7 x weekly - 2 reps - 20 sec hold - Seated March  - 1 x daily - 7 x weekly - 2 sets - 10 reps - Seated Long Arc Quad  - 1 x daily - 7 x weekly - 2 sets - 10 reps - Seated Hip Adduction Isometrics with Ball  - 1 x daily - 7 x weekly - 2 sets - 10 reps  ASSESSMENT:  CLINICAL IMPRESSION: Pt arrives with no knee pain but the walk back to the pool challenged her breathing quite a bit that she needed to sit for a good 5-8 minutes to catch her breath. Pt has some initial difficulty finding her balance and moving in the water but eventually caught on. No pain with exercises.    OBJECTIVE IMPAIRMENTS: Abnormal gait, decreased balance, difficulty walking, decreased ROM, decreased strength, increased edema, increased fascial restrictions, increased muscle spasms, impaired flexibility, postural dysfunction, obesity, and pain.   ACTIVITY LIMITATIONS: carrying, lifting, bending, standing, squatting, sleeping, stairs, transfers, bed mobility, bathing, toileting, dressing, and caring for others  PARTICIPATION LIMITATIONS: meal prep, cleaning, laundry, driving, shopping, community activity, and yard work  PERSONAL FACTORS: Fitness and 1-2 comorbidities: COPD, depression/anxiety are also affecting patient's functional outcome.   REHAB POTENTIAL: Fair due to multiple  co-morbidities  CLINICAL DECISION MAKING: Evolving/moderate complexity  EVALUATION COMPLEXITY: Moderate   GOALS: Goals reviewed with patient? Yes  SHORT TERM GOALS: Target date: 08/04/2023  Pain report to be no greater than 4/10  Baseline: Goal status: Ongoing  2.  Patient will be independent with initial HEP  Baseline:  Goal status: Ongoing   LONG TERM GOALS: Target date: 09/01/2023  Patient to report pain no greater than 2/10  Baseline:  Goal status: INITIAL  2.  Patient to be independent with advanced HEP  Baseline:  Goal status: INITIAL  3.  LEFS to be 50% or better Baseline:  Goal status: INITIAL  4.  Patient to be able to ascend and descend steps without pain or no greater than 2/10  Baseline:  Goal status: INITIAL  5.  Patient to be able to stand or walk for at least 15 min without leg pain  Baseline:  Goal status: INITIAL  6.  Patient to be able to bend, stoop and squat with pain no greater than 2/10  Baseline:  Goal status: INITIAL   PLAN:  PT FREQUENCY: 1-2x/week  PT DURATION: 8 weeks  PLANNED INTERVENTIONS: 97110-Therapeutic exercises, 97530- Therapeutic activity, 97112- Neuromuscular re-education, 97535- Self Care, 16109- Manual therapy, [id number]- Gait training, [phone]- Aquatic Therapy, [phone]- Electrical stimulation (unattended), [phone]- Electrical stimulation (manual), 97016- Vasopneumatic device, [id number]- Ultrasound, [id number]- Ionotophoresis 4mg /ml Dexamethasone , Patient/Family education, Balance training, Stair training, Taping, Dry Needling, Joint mobilization, Vestibular training, Visual/preceptual remediation/compensation, Cryotherapy, and Moist heat  PLAN FOR NEXT SESSION: Assess and progress HEP as indicated, strengthening, flexibility, manual/dry needling as indicated  Bethanne Brooks, PTA 07/27/23 12:54 PM     [facility] [address] [place], [place] [address] Phone # [phone] Fax [phone]

## 2023-07-28 ENCOUNTER — Encounter (HOSPITAL_BASED_OUTPATIENT_CLINIC_OR_DEPARTMENT_OTHER)
Admission: RE | Admit: 2023-07-28 | Discharge: 2023-07-28 | Disposition: A | Discharge status: Home / Self Care | Source: Ambulatory Visit | Attending: Surgery | Admitting: Surgery

## 2023-07-28 DIAGNOSIS — I10 Essential (primary) hypertension: Secondary | ICD-10-CM | POA: Diagnosis not present

## 2023-07-28 DIAGNOSIS — Z01818 Encounter for other preprocedural examination: Secondary | ICD-10-CM | POA: Diagnosis present

## 2023-07-28 DIAGNOSIS — Z01812 Encounter for preprocedural laboratory examination: Secondary | ICD-10-CM | POA: Diagnosis not present

## 2023-07-28 DIAGNOSIS — C50911 Malignant neoplasm of unspecified site of right female breast: Secondary | ICD-10-CM | POA: Diagnosis not present

## 2023-07-28 LAB — BASIC METABOLIC PANEL WITH GFR
Anion gap: 8 (ref 5–15)
BUN: 21 mg/dL (ref 8–23)
CO2: 27 mmol/L (ref 22–32)
Calcium: 9.6 mg/dL (ref 8.9–10.3)
Chloride: 99 mmol/L (ref 98–111)
Creatinine, Ser: 0.89 mg/dL (ref 0.44–1.00)
GFR, Estimated: >60 mL/min (ref >60)
Glucose, Bld: 96 mg/dL (ref 70–99)
Potassium: 4.5 mmol/L (ref 3.5–5.1)
Sodium: 134 mmol/L — ABNORMAL LOW (ref 135–145)

## 2023-07-28 MED ORDER — CHLORHEXIDINE GLUCONATE CLOTH 2 % EX PADS: 6.0000 | MEDICATED_PAD | Freq: Once | CUTANEOUS | Status: DC

## 2023-07-28 NOTE — Anesthesia Preprocedure Evaluation (Addendum)
 Anesthesia Evaluation  Patient identified by MRN, date of birth, ID band Patient awake    Reviewed: Allergy & Precautions, NPO status , Patient's Chart, lab work & pertinent test results  History of Anesthesia Complications (+) DIFFICULT AIRWAY, Family history of anesthesia reaction and history of anesthetic complications (sister with PONV)  Airway Mallampati: III  TM Distance: >3 FB Neck ROM: Limited   Comment: Previous 2-hand mask and grade I view with Glidescope 4. Subsequently grade II view with Glidescope 3 that became a grade I view with anterior pressure. Dental   Pulmonary asthma , sleep apnea , COPD,  COPD inhaler, former smoker   breath sounds clear to auscultation       Cardiovascular hypertension (triamterene-HCTZ), Pt. on medications + dysrhythmias (palpitations)  Rhythm:Regular Rate:Normal  HLD   Neuro/Psych  PSYCHIATRIC DISORDERS Anxiety Depression       GI/Hepatic   Endo/Other  diabetes, Type 2, Oral Hypoglycemic Agents  Class 3 obesityThyroid nodules, hyperparathyroidism  Renal/GU      Musculoskeletal  (+) Arthritis ,    Abdominal   Peds  Hematology   Anesthesia Other Findings   Reproductive/Obstetrics Right breast cancer                             Anesthesia Physical Anesthesia Plan  ASA: 3  Anesthesia Plan: General   Post-op Pain Management: Tylenol  PO (pre-op)*   Induction: Intravenous  PONV Risk Score and Plan: 4 or greater and Ondansetron , Dexamethasone  and Midazolam   Airway Management Planned: LMA  Additional Equipment: None  Intra-op Plan:   Post-operative Plan: Extubation in OR  Informed Consent: I have reviewed the patients History and Physical, chart, labs and discussed the procedure including the risks, benefits and alternatives for the proposed anesthesia with the patient or authorized representative who has indicated his/her understanding and  acceptance.     Dental advisory given  Plan Discussed with: CRNA  Anesthesia Plan Comments:        Anesthesia Quick Evaluation

## 2023-07-28 NOTE — Progress Notes (Signed)

## 2023-07-29 ENCOUNTER — Ambulatory Visit: Admitting: Rehabilitative and Restorative Service Providers"

## 2023-08-01 DIAGNOSIS — D0511 Intraductal carcinoma in situ of right breast: Secondary | ICD-10-CM | POA: Diagnosis not present

## 2023-08-02 ENCOUNTER — Ambulatory Visit (HOSPITAL_BASED_OUTPATIENT_CLINIC_OR_DEPARTMENT_OTHER): Payer: Self-pay | Admitting: Anesthesiology

## 2023-08-02 ENCOUNTER — Other Ambulatory Visit: Payer: Self-pay

## 2023-08-02 ENCOUNTER — Ambulatory Visit (HOSPITAL_BASED_OUTPATIENT_CLINIC_OR_DEPARTMENT_OTHER)
Admission: RE | Admit: 2023-08-02 | Discharge: 2023-08-02 | Disposition: A | Discharge status: Home / Self Care | Attending: Surgery | Admitting: Surgery

## 2023-08-02 ENCOUNTER — Encounter (HOSPITAL_BASED_OUTPATIENT_CLINIC_OR_DEPARTMENT_OTHER): Payer: Self-pay | Admitting: Surgery

## 2023-08-02 ENCOUNTER — Encounter (HOSPITAL_BASED_OUTPATIENT_CLINIC_OR_DEPARTMENT_OTHER)
Admission: RE | Disposition: A | Discharge status: Home / Self Care | Payer: Self-pay | Source: Home / Self Care | Attending: Surgery

## 2023-08-02 DIAGNOSIS — Z7984 Long term (current) use of oral hypoglycemic drugs: Secondary | ICD-10-CM | POA: Diagnosis not present

## 2023-08-02 DIAGNOSIS — Z6841 Body Mass Index (BMI) 40.0 and over, adult: Secondary | ICD-10-CM | POA: Diagnosis not present

## 2023-08-02 DIAGNOSIS — E66813 Obesity, class 3: Secondary | ICD-10-CM | POA: Diagnosis not present

## 2023-08-02 DIAGNOSIS — G4730 Sleep apnea, unspecified: Secondary | ICD-10-CM | POA: Insufficient documentation

## 2023-08-02 DIAGNOSIS — C50411 Malignant neoplasm of upper-outer quadrant of right female breast: Secondary | ICD-10-CM | POA: Diagnosis not present

## 2023-08-02 DIAGNOSIS — E119 Type 2 diabetes mellitus without complications: Secondary | ICD-10-CM | POA: Insufficient documentation

## 2023-08-02 DIAGNOSIS — Z170 Estrogen receptor positive status [ER+]: Secondary | ICD-10-CM

## 2023-08-02 DIAGNOSIS — I10 Essential (primary) hypertension: Secondary | ICD-10-CM

## 2023-08-02 DIAGNOSIS — Z1721 Progesterone receptor positive status: Secondary | ICD-10-CM | POA: Diagnosis not present

## 2023-08-02 DIAGNOSIS — C50911 Malignant neoplasm of unspecified site of right female breast: Secondary | ICD-10-CM

## 2023-08-02 DIAGNOSIS — Z87891 Personal history of nicotine dependence: Secondary | ICD-10-CM

## 2023-08-02 DIAGNOSIS — J449 Chronic obstructive pulmonary disease, unspecified: Secondary | ICD-10-CM | POA: Diagnosis not present

## 2023-08-02 DIAGNOSIS — N641 Fat necrosis of breast: Secondary | ICD-10-CM | POA: Diagnosis not present

## 2023-08-02 DIAGNOSIS — Z1732 Human epidermal growth factor receptor 2 negative status: Secondary | ICD-10-CM | POA: Diagnosis not present

## 2023-08-02 HISTORY — DX: Personal history of nicotine dependence: Z87.891

## 2023-08-02 HISTORY — PX: BREAST LUMPECTOMY WITH RADIOACTIVE SEED LOCALIZATION: SHX6424

## 2023-08-02 HISTORY — DX: Type 2 diabetes mellitus without complications: E11.9

## 2023-08-02 HISTORY — DX: Hyperlipidemia, unspecified: E78.5

## 2023-08-02 LAB — GLUCOSE, CAPILLARY
Glucose-Capillary: 100 mg/dL — ABNORMAL HIGH (ref 70–99)
Glucose-Capillary: 112 mg/dL — ABNORMAL HIGH (ref 70–99)

## 2023-08-02 SURGERY — BREAST LUMPECTOMY WITH RADIOACTIVE SEED LOCALIZATION
Anesthesia: General | Site: Breast | Laterality: Right

## 2023-08-02 MED ORDER — ONDANSETRON HCL 4 MG/2ML IJ SOLN
INTRAMUSCULAR | Status: AC
  Filled 2023-08-02: qty 2

## 2023-08-02 MED ORDER — OXYCODONE HCL 5 MG PO TABS: 5.0000 mg | ORAL_TABLET | Freq: Once | ORAL | Status: DC | PRN

## 2023-08-02 MED ORDER — 0.9 % SODIUM CHLORIDE (POUR BTL) OPTIME
TOPICAL | Status: DC | PRN
  Administered 2023-08-02: 1000 mL

## 2023-08-02 MED ORDER — CIPROFLOXACIN IN D5W 400 MG/200ML IV SOLN
INTRAVENOUS | Status: AC
  Filled 2023-08-02: qty 200

## 2023-08-02 MED ORDER — DROPERIDOL 2.5 MG/ML IJ SOLN: 0.6250 mg | Freq: Once | INTRAMUSCULAR | Status: DC | PRN

## 2023-08-02 MED ORDER — TRAMADOL HCL 50 MG PO TABS: 50.0000 mg | ORAL_TABLET | Freq: Four times a day (QID) | ORAL | 0 refills | Status: DC | PRN

## 2023-08-02 MED ORDER — LIDOCAINE 2% (20 MG/ML) 5 ML SYRINGE
INTRAMUSCULAR | Status: AC
  Filled 2023-08-02: qty 5

## 2023-08-02 MED ORDER — ONDANSETRON HCL 4 MG/2ML IJ SOLN
INTRAMUSCULAR | Status: DC | PRN
  Administered 2023-08-02: 4 mg via INTRAVENOUS

## 2023-08-02 MED ORDER — LACTATED RINGERS IV SOLN: INTRAVENOUS | Status: DC

## 2023-08-02 MED ORDER — FENTANYL CITRATE (PF) 100 MCG/2ML IJ SOLN
INTRAMUSCULAR | Status: AC
  Filled 2023-08-02: qty 2

## 2023-08-02 MED ORDER — BUPIVACAINE-EPINEPHRINE (PF) 0.25% -1:200000 IJ SOLN
INTRAMUSCULAR | Status: DC | PRN
  Administered 2023-08-02: 29 mL

## 2023-08-02 MED ORDER — ACETAMINOPHEN 500 MG PO TABS
ORAL_TABLET | ORAL | Status: AC
  Filled 2023-08-02: qty 2

## 2023-08-02 MED ORDER — ACETAMINOPHEN 10 MG/ML IV SOLN: 1000.0000 mg | Freq: Once | INTRAVENOUS | Status: DC | PRN

## 2023-08-02 MED ORDER — CLINDAMYCIN PHOSPHATE 900 MG/50ML IV SOLN: 900.0000 mg | INTRAVENOUS | Status: DC

## 2023-08-02 MED ORDER — LIDOCAINE 2% (20 MG/ML) 5 ML SYRINGE
INTRAMUSCULAR | Status: DC | PRN
  Administered 2023-08-02: 4 mg via INTRAVENOUS

## 2023-08-02 MED ORDER — ACETAMINOPHEN 500 MG PO TABS
1000.0000 mg | ORAL_TABLET | ORAL | Status: AC
  Administered 2023-08-02: 1000 mg via ORAL

## 2023-08-02 MED ORDER — PROPOFOL 10 MG/ML IV BOLUS
INTRAVENOUS | Status: AC
  Filled 2023-08-02: qty 20

## 2023-08-02 MED ORDER — FENTANYL CITRATE (PF) 100 MCG/2ML IJ SOLN: 25.0000 ug | INTRAMUSCULAR | Status: DC | PRN

## 2023-08-02 MED ORDER — PROPOFOL 10 MG/ML IV BOLUS
INTRAVENOUS | Status: DC | PRN
  Administered 2023-08-02: 200 mg via INTRAVENOUS

## 2023-08-02 MED ORDER — CLINDAMYCIN PHOSPHATE 900 MG/50ML IV SOLN
INTRAVENOUS | Status: AC
Start: 2023-08-02 — End: ?
  Filled 2023-08-02: qty 50

## 2023-08-02 MED ORDER — OXYCODONE HCL 5 MG/5ML PO SOLN: 5.0000 mg | Freq: Once | ORAL | Status: DC | PRN

## 2023-08-02 MED ORDER — FENTANYL CITRATE (PF) 100 MCG/2ML IJ SOLN
INTRAMUSCULAR | Status: DC | PRN
  Administered 2023-08-02: 25 ug via INTRAVENOUS
  Administered 2023-08-02: 75 ug via INTRAVENOUS

## 2023-08-02 MED ORDER — CIPROFLOXACIN IN D5W 400 MG/200ML IV SOLN
INTRAVENOUS | Status: DC | PRN
Start: 2023-08-02 — End: 2023-08-02
  Administered 2023-08-02: 400 mg via INTRAVENOUS

## 2023-08-02 SURGICAL SUPPLY — 41 items
BINDER BREAST LRG (GAUZE/BANDAGES/DRESSINGS) IMPLANT
BINDER BREAST MEDIUM (GAUZE/BANDAGES/DRESSINGS) IMPLANT
BINDER BREAST XLRG (GAUZE/BANDAGES/DRESSINGS) IMPLANT
BINDER BREAST XXLRG (GAUZE/BANDAGES/DRESSINGS) IMPLANT
BLADE SURG 15 STRL LF DISP TIS (BLADE) ×1 IMPLANT
CANISTER SUC SOCK COL 7IN (MISCELLANEOUS) IMPLANT
CANISTER SUCT 1200ML W/VALVE (MISCELLANEOUS) IMPLANT
CHLORAPREP W/TINT 26 (MISCELLANEOUS) ×1 IMPLANT
CLIP APPLIE 9.375 MED OPEN (MISCELLANEOUS) IMPLANT
COVER BACK TABLE 60X90IN (DRAPES) ×1 IMPLANT
COVER MAYO STAND STRL (DRAPES) ×1 IMPLANT
COVER PROBE CYLINDRICAL 5X96 (MISCELLANEOUS) ×1 IMPLANT
DERMABOND ADVANCED .7 DNX12 (GAUZE/BANDAGES/DRESSINGS) ×1 IMPLANT
DRAPE LAPAROSCOPIC ABDOMINAL (DRAPES) IMPLANT
DRAPE LAPAROTOMY 100X72 PEDS (DRAPES) ×1 IMPLANT
DRAPE UTILITY XL STRL (DRAPES) ×1 IMPLANT
ELECT COATED BLADE 2.86 ST (ELECTRODE) ×1 IMPLANT
ELECTRODE REM PT RTRN 9FT ADLT (ELECTROSURGICAL) ×1 IMPLANT
GLOVE BIOGEL PI IND STRL 8 (GLOVE) ×1 IMPLANT
GLOVE ECLIPSE 8.0 STRL XLNG CF (GLOVE) ×1 IMPLANT
GOWN STRL REUS W/ TWL LRG LVL3 (GOWN DISPOSABLE) ×2 IMPLANT
GOWN STRL REUS W/ TWL XL LVL3 (GOWN DISPOSABLE) ×1 IMPLANT
HEMOSTAT ARISTA ABSORB 3G PWDR (HEMOSTASIS) IMPLANT
HEMOSTAT SNOW SURGICEL 2X4 (HEMOSTASIS) IMPLANT
KIT MARKER MARGIN INK (KITS) ×1 IMPLANT
NDL HYPO 25X1 1.5 SAFETY (NEEDLE) ×1 IMPLANT
NEEDLE HYPO 25X1 1.5 SAFETY (NEEDLE) ×1 IMPLANT
NS IRRIG 1000ML POUR BTL (IV SOLUTION) ×1 IMPLANT
PACK BASIN DAY SURGERY FS (CUSTOM PROCEDURE TRAY) ×1 IMPLANT
PENCIL SMOKE EVACUATOR (MISCELLANEOUS) ×1 IMPLANT
SLEEVE SCD COMPRESS KNEE MED (STOCKING) ×1 IMPLANT
SPIKE FLUID TRANSFER (MISCELLANEOUS) IMPLANT
SPONGE T-LAP 4X18 ~~LOC~~+RFID (SPONGE) ×1 IMPLANT
SUT MNCRL AB 4-0 PS2 18 (SUTURE) ×1 IMPLANT
SUT SILK 2 0 SH (SUTURE) IMPLANT
SUT VICRYL 3-0 CR8 SH (SUTURE) ×1 IMPLANT
SYR CONTROL 10ML LL (SYRINGE) ×1 IMPLANT
TOWEL GREEN STERILE FF (TOWEL DISPOSABLE) ×1 IMPLANT
TRAY FAXITRON CT DISP (TRAY / TRAY PROCEDURE) ×1 IMPLANT
TUBE CONNECTING 20X1/4 (TUBING) IMPLANT
YANKAUER SUCT BULB TIP NO VENT (SUCTIONS) IMPLANT

## 2023-08-02 NOTE — Op Note (Signed)
 Preoperative diagnosis: Stage I right breast cancer upper outer quadrant ER positive  Postoperative diagnosis: Same  Procedure: Right breast seed localized lumpectomy  Surgeon: [name], MD  Anesthesia: LMA with 0.25% Marcaine  with epinephrine   EBL: 10 cc  Specimen: Right breast tissue with Magseed and clip verified by Faxitron with grossly negative margins  Indications for procedure: The patient is a 73 year old female with stage I right breast cancer.  She was seen in the multidisciplinary clinic and opted for breast conservation.  Risk, benefits and long-term expectations of therapy reviewed.  This was contrasted to mastectomy reconstruction with respect to local regional recurrence, quality of life and overall survival.The procedure has been discussed with the patient. Alternatives to surgery have been discussed with the patient.  Risks of surgery include bleeding,  Infection, cosmesis, reexcision seroma formation, death,  and the need for further surgery.   The patient understands and wishes to proceed.    Description of procedure: The patient was met in the holding area and questions were answered.  The right breast was marked as the correct site.  A Magseed was placed as an outpatient.  The right side was marked as correct.  She was taken back and placed upon the OR table.  After induction of general anesthesia, the right breast was prepped and draped in sterile fashion timeout performed.  Proper patient, site and procedure verified.  The Magseed probe was used to identify the marker in the right breast in the upper outer quadrant.  Films were available for review to help guide.  A curvilinear incision was made over the signal.  Dissection was carried down all tissue around the seed and clip were excised with grossly negative margins.  Imaging revealed both the clip and seed to be in the specimen.  The tissue was oriented with ink and sent to pathology.  The cavities made hemostatic  with cautery.  Local anesthetic was infiltrated throughout the cavity.  Clips were used to mark the cavity and the deep tissue planes were approximated 3-0 Vicryl.  4 Monocryl used to close the skin in a subcuticular fashion.  Dermabond applied.  Breast binder placed.  All counts found to be correct.  The patient was awoke extubated taken to recovery in satisfactory condition.

## 2023-08-02 NOTE — H&P (Signed)
 History of Present Illness: Tammy Mccoy is a 73 y.o. female who is seen today as an office consultation for evaluation of Breast Cancer  PT SEEN IN THE [facility] STAGE 1 RIGHT BREAST CANCER DETECTED ON SCREENING MAMMOGRAM. No history of pain, mass or discharge   Review of Systems: A complete review of systems was obtained from the patient. I have reviewed this information and discussed as appropriate with the patient. See HPI as well for other ROS.    Medical History: Past Medical History:  Diagnosis Date  Anxiety  Arthritis  COPD (chronic obstructive pulmonary disease) (CMS/HHS-HCC)  History of cancer  Hypertension  Sleep apnea   There is no problem list on file for this patient.  Past Surgical History:  Procedure Laterality Date  carpal tunnel surgery N/A  CHOLECYSTECTOMY  csection surgery  parathyroid  surgery    Allergies  Allergen Reactions  Bupropion Other (See Comments)  Worsened depression  Worsened depression   Ibuprofen Other (See Comments) and Itching  Lower lip swelling  Lower lip swelling   Duloxetine Other (See Comments)  Mirabegron Other (See Comments)  Oxybutynin Chloride Other (See Comments)  Rosuvastatin Other (See Comments)   Current Outpatient Medications on File Prior to Visit  Medication Sig Dispense Refill  ALPRAZolam (XANAX) 0.5 MG tablet alprazolam 0.5 mg tablet TAKE 1 2 TO 1 (ONE HALF TO ONE) TABLET BY MOUTH AT BEDTIME AS NEEDED FOR SLEEP  FLUoxetine (PROZAC) 20 MG tablet 1 tablet (20 mg total)  metFORMIN (GLUCOPHAGE-XR) 500 MG XR tablet TAKE 1 TABLET BY MOUTH ONCE DAILY IN THE EVENING WITH A MEAL  naproxen  (NAPROSYN ) 500 MG tablet naproxen  500 mg tablet TAKE 1 TABLET BY MOUTH TWICE DAILY  pravastatin (PRAVACHOL) 10 MG tablet Take 1 tablet (10 mg total) by mouth twice a week  triamterene-hydrochlorothiazide  (MAXZIDE-25) 37.5-25 mg tablet Take 1 tablet by mouth every morning (Patient not taking: Reported on 06/29/2023)   No current  facility-administered medications on file prior to visit.   Family History  Problem Relation Age of Onset  Stroke Mother  Hyperlipidemia (Elevated cholesterol) Mother  High blood pressure (Hypertension) Mother  Obesity Sister  Breast cancer Sister    Social History   Tobacco Use  Smoking Status Former  Types: Cigarettes  Smokeless Tobacco Never    Social History   Socioeconomic History  Marital status: Married  Tobacco Use  Smoking status: Former  Types: Cigarettes  Smokeless tobacco: Never  Vaping Use  Vaping status: Never Used  Substance and Sexual Activity  Alcohol use: Never  Drug use: Never   Objective:  There were no vitals filed for this visit.  There is no height or weight on file to calculate BMI.  Physical Exam Exam conducted with a chaperone present.  HENT:  Head: Normocephalic.  Cardiovascular:  Rate and Rhythm: Normal rate.  Pulmonary:  Effort: Pulmonary effort is normal.  Chest:  Breasts: Right: Normal.  Left: Normal.  Lymphadenopathy:  Upper Body:  Right upper body: No supraclavicular or axillary adenopathy.  Left upper body: No supraclavicular or axillary adenopathy.  Skin: General: Skin is warm.  Neurological:  General: No focal deficit present.  Mental Status: She is alert.     Labs, Imaging and Diagnostic Testing:  FINAL DIAGNOSIS   1. Breast, right, needle core biopsy, 12:00 12cmfn :  INVASIVE MODERATELY DIFFERENTIATED DUCTAL ADENOCARCINOMA, GRADE 2 (3+2+1)  TUBULE FORMATION: SCORE 3  NUCLEAR PLEOMORPHISM: SCORE 2  MITOTIC COUNT: SCORE 1  TOTAL SCORE: 6  OVERALL GRADE: GRADE 2 (  6/9)  NEGATIVE FOR ANGIOLYMPHATIC INVASION  NEGATIVE FOR MICROCALCIFICATIONS  TUMOR MEASURES 4 MM IN GREATEST LINEAR EXTENT   Diagnosis Note : Immunohistochemical stains for the breast prognostic markers  have been ordered, and these results will be issued within a subsequent addendum  to this report.  Case is reviewed by Dr. Bernetta Brilliant who  concurs with the interpretation.  Diagnosis called to Dr. Zoila Hines at [facility] by Dr. Kavin Parsley  on 06/15/2023 at 8:40 AM.   DATE SIGNED OUT: 06/15/2023  ELECTRONIC SIGNATURE : Picklesimer Md, Fred , Sports administrator, Electronic Signature   MICROSCOPIC DESCRIPTION   CASE COMMENTS  STAINS USED IN DIAGNOSIS:  H&E-2  H&E-3  H&E-4  H&E  *RECUT 1 SLIDE  Stains used in diagnosis 1 Her2 by IHC, 1 ER-ACIS, 1 KI-67-ACIS, 1 PR-ACIS, 1  Her2 FISH  IHC scores are reported using ASCO/CAP scoring criteria. An IHC Score of 0 or  1+ is NEGATIVE for HER2, 3+ is POSITIVE for HER2, and 2+ is EQUIVOCAL.  Equivocal results are reflexed to either FISH or IHC testing. Specimens are  fixed in 10% Neutral Buffered Formalin for at least 6 hours and up to 72 hours.  These tests have not be validated on decalcified tissue. Results should be  interpreted with caution given the possibility of false negative results on  decalcified specimens. Antibody Clone for HER2 is 4B5 (PATHWAY). Some of these  immunohistochemical stains may have been developed and the performance  characteristics determined by [facility]. Some may not have been  cleared or approved by the U.S. Food and Drug Administration. The FDA has  determined that such clearance or approval is not necessary. This test is used  for clinical purposes. It should not be regarded as investigational or for  research. This laboratory is certified under the Clinical Laboratory  Improvement Amendments of 1988 (CLIA-88) as qualified to perform high complexity  clinical laboratory testing.  Estrogen receptor (6F11), immunohistochemical stains are performed on formalin  fixed, paraffin embedded tissue using a 3,3"-diaminobenzidine (DAB) chromogen  and Leica Bond Autostainer System. The staining intensity of the nucleus is  scored manually and is reported as the percentage of tumor cell nuclei  demonstrating specific nuclear  staining.Specimens are fixed in 10% Neutral  Buffered Formalin for at least 6 hours and up to 72 hours. These tests have not  be validated on decalcified tissue. Results should be interpreted with caution  given the possibility of false negative results on decalcified specimens.  Ki-67 (MM1), immunohistochemical stains are performed on formalin fixed,  paraffin embedded tissue using a 3,3"-diaminobenzidine (DAB) chromogen and Leica  Bond Autostainer System. The staining intensity of the nucleus is scored  manually and is reported as the percentage of tumor cell nuclei demonstrating  specific nuclear staining.Specimens are fixed in 10% Neutral Buffered Formalin  for at least 6 hours and up to 72 hours. These tests have not be validated on  decalcified tissue. Results should be interpreted with caution given the  possibility of false negative results on decalcified specimens.  PR progesterone receptor (16), immunohistochemical stains are performed on  formalin fixed, paraffin embedded tissue using a 3,3"-diaminobenzidine (DAB)  chromogen and Leica Bond Autostainer System. The staining intensity of the  nucleus is scored manually and is reported as the percentage of tumor cell  nuclei demonstrating specific nuclear staining.Specimens are fixed in 10%  Neutral Buffered Formalin for at least 6 hours and up to 72 hours. These tests  have not be validated on  decalcified tissue. Results should be interpreted with  caution given the possibility of false negative results on decalcified  specimens.  HER2 IQFISH pharmDX (code [id number]) is a direct fluorescence in-situ hybridization  assay designed to quantitatively determine HER2 gene amplification in  formalin-fixed, paraffin-embedded tissue specimens. Results are reported using  ASCO/CAP and manufacturer's scoring criteria. GROUP 1: Ratio of HER2/CEN 17  >=2.0 and average HER2 >=4.0 signals/cell = POSITIVE. GROUP 2: Ratio of HER2  /CEN 17 >=2.0 and  average HER2 < 4.0 signals/cell with an IHC of 2+ = Review of  20 additional cells. If score is still >=2.0 and average HER2 < 4.0 = NEGATIVE.  GROUP 3: Ratio of Her2/CEN 17 < 2.0 and average HER2 >=6.0 signals/cell with an  IHC of 2+ = Review of 20 additional cells. If score is still < 2.0 and average  HER2 >=6.0 signals/cell= POSITIVE. GROUP 4: Ratio of Her2 /CEN 17 < 2.0 and  average HER2 >=4.0 and < 6.0 signals/cell with an IHC score of 2+ =Review of 20  additional cells. If score is still < 2.0 and average HER2 >=4.0 and < 6.0  signals/cell= NEGATIVE. GROUP 5: Ratio of HER2/CEN 17 < 2.0 and average HER2 < 4.0 signals/cell = NEGATIVE Specimens are fixed in 10% Neutral Buffered  Formalin for at least 6 hours and up to 72 hours. These tests have not been  validated on decalcified tissue. Results should be interpreted with caution  given the possibility of false negative results on decalcified specimens.   ADDENDUM  Breast, right, needle core biopsy, 12:00 12cmfn  PROGNOSTIC INDICATORS   Results:  IMMUNOHISTOCHEMICAL AND MORPHOMETRIC ANALYSIS PERFORMED MANUALLY  The tumor cells are EQUIVOCAL for Her2 (2+). Her2 by FISH will be performed and the results reported separately.  Estrogen Receptor: 90%, POSITIVE, STRONG STAINING INTENSITY  Progesterone Receptor: 20%, POSITIVE, MODERATE-STRONG STAINING INTENSITY  Proliferation Marker Ki67: 20%  REFERENCE RANGE ESTROGEN RECEPTOR  NEGATIVE 0%  POSITIVE =>1%  REFERENCE RANGE PROGESTERONE RECEPTOR  NEGATIVE 0%  POSITIVE =>1%  All controls stained appropriately  Earleen Glazier, John, Pathologist, Electronic Signature  ( Signed 04 17 2025)  1A-Breast,right,needle core biopsy  FLUORESCENCE IN-SITU HYBRIDIZATION   Results:  GROUP 5: HER2 **NEGATIVE**  Assessment and Plan:   Diagnoses and all orders for this visit:  Breast cancer, stage 1, right (CMS/HHS-HCC)   PT HAS OPTED FOR RIGHT BREAST SEED LUMPECTOMY. The procedure has been discussed  with the patient. Alternatives to surgery have been discussed with the patient. Risks of surgery include bleeding, Infection, Seroma formation, death, and the need for further surgery. The patient understands and wishes to proceed.    Sharlee Dawes, MD

## 2023-08-02 NOTE — Discharge Instructions (Addendum)
 No tylenol  until 3:30 p.m.   [facility] Office Phone Number [phone]  BREAST BIOPSY/ PARTIAL MASTECTOMY: POST OP INSTRUCTIONS  Always review your discharge instruction sheet given to you by the facility where your surgery was performed.  IF YOU HAVE DISABILITY OR FAMILY LEAVE FORMS, YOU MUST BRING THEM TO THE OFFICE FOR PROCESSING.  DO NOT GIVE THEM TO YOUR DOCTOR.  A prescription for pain medication may be given to you upon discharge.  Take your pain medication as prescribed, if needed.  If narcotic pain medicine is not needed, then you may take acetaminophen  (Tylenol ) or ibuprofen (Advil) as needed. Take your usually prescribed medications unless otherwise directed If you need a refill on your pain medication, please contact your pharmacy.  They will contact our office to request authorization.  Prescriptions will not be filled after 5pm or on week-ends. You should eat very light the first 24 hours after surgery, such as soup, crackers, pudding, etc.  Resume your normal diet the day after surgery. Most patients will experience some swelling and bruising in the breast.  Ice packs and a good support bra will help.  Swelling and bruising can take several days to resolve.  It is common to experience some constipation if taking pain medication after surgery.  Increasing fluid intake and taking a stool softener will usually help or prevent this problem from occurring.  A mild laxative (Milk of Magnesia or Miralax) should be taken according to package directions if there are no bowel movements after 48 hours. Unless discharge instructions indicate otherwise, you may remove your bandages 24-48 hours after surgery, and you may shower at that time.  You may have steri-strips (small skin tapes) in place directly over the incision.  These strips should be left on the skin for 7-10 days.  If your surgeon used skin glue on the incision, you may shower in 24 hours.  The glue will flake off  over the next 2-3 weeks.  Any sutures or staples will be removed at the office during your follow-up visit. ACTIVITIES:  You may resume regular daily activities (gradually increasing) beginning the next day.  Wearing a good support bra or sports bra minimizes pain and swelling.  You may have sexual intercourse when it is comfortable. You may drive when you no longer are taking prescription pain medication, you can comfortably wear a seatbelt, and you can safely maneuver your car and apply brakes. RETURN TO WORK:  ______________________________________________________________________________________ Tammy Mccoy should see your doctor in the office for a follow-up appointment approximately two weeks after your surgery.  Your doctor's nurse will typically make your follow-up appointment when she calls you with your pathology report.  Expect your pathology report 2-3 business days after your surgery.  You may call to check if you do not hear from us  after three days. OTHER INSTRUCTIONS: _______________________________________________________________________________________________ _____________________________________________________________________________________________________________________________________ _____________________________________________________________________________________________________________________________________ _____________________________________________________________________________________________________________________________________  WHEN TO CALL YOUR DOCTOR: Fever over 101.0 Nausea and/or vomiting. Extreme swelling or bruising. Continued bleeding from incision. Increased pain, redness, or drainage from the incision.  The clinic staff is available to answer your questions during regular business hours.  Please don't hesitate to call and ask to speak to one of the nurses for clinical concerns.  If you have a medical emergency, go to the nearest emergency room or call  911.  A surgeon from [facility] is always on call at the hospital.  For further questions, please visit [email or web address]     Post Anesthesia Home Care Instructions  Activity:  Get plenty of rest for the remainder of the day. A responsible individual must stay with you for 24 hours following the procedure.  For the next 24 hours, DO NOT: -Drive a car -Advertising copywriter -Drink alcoholic beverages -Take any medication unless instructed by your physician -Make any legal decisions or sign important papers.  Meals: Start with liquid foods such as gelatin or soup. Progress to regular foods as tolerated. Avoid greasy, spicy, heavy foods. If nausea and/or vomiting occur, drink only clear liquids until the nausea and/or vomiting subsides. Call your physician if vomiting continues.  Special Instructions/Symptoms: Your throat may feel dry or sore from the anesthesia or the breathing tube placed in your throat during surgery. If this causes discomfort, gargle with warm salt water. The discomfort should disappear within 24 hours.  If you had a scopolamine patch placed behind your ear for the management of post- operative nausea and/or vomiting:  1. The medication in the patch is effective for 72 hours, after which it should be removed.  Wrap patch in a tissue and discard in the trash. Wash hands thoroughly with soap and water. 2. You may remove the patch earlier than 72 hours if you experience unpleasant side effects which may include dry mouth, dizziness or visual disturbances. 3. Avoid touching the patch. Wash your hands with soap and water after contact with the patch.

## 2023-08-02 NOTE — Anesthesia Postprocedure Evaluation (Signed)
 Anesthesia Post Note  Patient: Tammy Mccoy  Procedure(s) Performed: BREAST LUMPECTOMY WITH RADIOACTIVE SEED LOCALIZATION (Right: Breast)     Patient location during evaluation: PACU Anesthesia Type: General Level of consciousness: awake and alert Pain management: pain level controlled Vital Signs Assessment: post-procedure vital signs reviewed and stable Respiratory status: spontaneous breathing, nonlabored ventilation, respiratory function stable and patient connected to nasal cannula oxygen Cardiovascular status: blood pressure returned to baseline and stable Postop Assessment: no apparent nausea or vomiting Anesthetic complications: no  No notable events documented.  Last Vitals:  Vitals:   08/02/23 1200 08/02/23 1215  BP: (!) 142/71 (!) 159/69  Pulse: 66 67  Resp: (!) 8 14  Temp:  (!) 36.4 C  SpO2: 96% 93%    Last Pain:  Vitals:   08/02/23 1215  TempSrc:   PainSc: 0-No pain                 Willian Harrow

## 2023-08-02 NOTE — Interval H&P Note (Signed)
 History and Physical Interval Note:  08/02/2023 10:12 AM  Tammy Mccoy  has presented today for surgery, with the diagnosis of RIGHT BREAST CANCER.  The various methods of treatment have been discussed with the patient and family. After consideration of risks, benefits and other options for treatment, the patient has consented to  Procedure(s) with comments: BREAST LUMPECTOMY WITH RADIOACTIVE SEED LOCALIZATION (Right) - RIGHT BREAST SEED LUMPECTOMY as a surgical intervention.  The patient's history has been reviewed, patient examined, no change in status, stable for surgery.  I have reviewed the patient's chart and labs.  Questions were answered to the patient's satisfaction.     Merlin Ege A Sammuel Blick

## 2023-08-02 NOTE — Transfer of Care (Signed)
 Immediate Anesthesia Transfer of Care Note  Patient: Tammy Mccoy  Procedure(s) Performed: Procedure(s) (LRB): BREAST LUMPECTOMY WITH RADIOACTIVE SEED LOCALIZATION (Right)  Patient Location: PACU  Anesthesia Type: General  Level of Consciousness: awake, oriented, sedated and patient cooperative  Airway & Oxygen Therapy: Patient Spontanous Breathing and Patient connected to face mask oxygen  Post-op Assessment: Report given to PACU RN and Post -op Vital signs reviewed and stable  Post vital signs: Reviewed and stable  Complications: No apparent anesthesia complications  Last Vitals:  Vitals Value Taken Time  BP 157/71 08/02/23 1144  Temp    Pulse 73 08/02/23 1144  Resp 0 08/02/23 1144  SpO2 100 % 08/02/23 1144  Vitals shown include unfiled device data.  Last Pain:  Vitals:   08/02/23 0929  TempSrc: Oral  PainSc: 0-No pain      Patients Stated Pain Goal: 5 (08/02/23 0929)  Complications: No notable events documented.

## 2023-08-02 NOTE — Anesthesia Postprocedure Evaluation (Deleted)
 Anesthesia Post Note  Patient: Brisa Auth  Procedure(s) Performed: BREAST LUMPECTOMY WITH RADIOACTIVE SEED LOCALIZATION (Right: Breast)     Patient location during evaluation: PACU Anesthesia Type: General Level of consciousness: awake and alert Pain management: pain level controlled Vital Signs Assessment: post-procedure vital signs reviewed and stable Respiratory status: spontaneous breathing, nonlabored ventilation, respiratory function stable and patient connected to nasal cannula oxygen Cardiovascular status: blood pressure returned to baseline and stable Postop Assessment: no apparent nausea or vomiting Anesthetic complications: no  No notable events documented.  Last Vitals:  Vitals:   08/02/23 1200 08/02/23 1215  BP: (!) 142/71 (!) 159/69  Pulse: 66 67  Resp: (!) 8 14  Temp:  (!) 36.4 C  SpO2: 96% 93%    Last Pain:  Vitals:   08/02/23 1215  TempSrc:   PainSc: 0-No pain                 Willian Harrow

## 2023-08-02 NOTE — Anesthesia Procedure Notes (Signed)
 Procedure Name: LMA Insertion Date/Time: 08/02/2023 11:49 AM  Performed by: Glo Larch, CRNAPre-anesthesia Checklist: Patient identified, Emergency Drugs available, Suction available and Patient being monitored Patient Re-evaluated:Patient Re-evaluated prior to induction Oxygen Delivery Method: Circle system utilized Preoxygenation: Pre-oxygenation with 100% oxygen Induction Type: IV induction Ventilation: Two handed mask ventilation required LMA: LMA with gastric port inserted LMA Size: 4.0 Number of attempts: 2 Airway Equipment and Method: Bite block Placement Confirmation: positive ETCO2 Tube secured with: Tape Dental Injury: Teeth and Oropharynx as per pre-operative assessment  Difficulty Due To: Difficulty was anticipated and Difficult Airway- due to large tongue Comments: LMA #4 with leak and poor seal, #4 gastric port LMA placed with good seal

## 2023-08-03 ENCOUNTER — Encounter: Admitting: Rehabilitative and Restorative Service Providers"

## 2023-08-03 ENCOUNTER — Encounter (HOSPITAL_BASED_OUTPATIENT_CLINIC_OR_DEPARTMENT_OTHER): Payer: Self-pay | Admitting: Surgery

## 2023-08-04 DIAGNOSIS — C50911 Malignant neoplasm of unspecified site of right female breast: Secondary | ICD-10-CM | POA: Diagnosis not present

## 2023-08-05 ENCOUNTER — Ambulatory Visit: Payer: Self-pay | Admitting: Physical Therapy

## 2023-08-08 ENCOUNTER — Other Ambulatory Visit: Payer: Self-pay

## 2023-08-08 DIAGNOSIS — Z122 Encounter for screening for malignant neoplasm of respiratory organs: Secondary | ICD-10-CM

## 2023-08-08 DIAGNOSIS — Z87891 Personal history of nicotine dependence: Secondary | ICD-10-CM

## 2023-08-09 ENCOUNTER — Ambulatory Visit: Payer: Self-pay | Admitting: Surgery

## 2023-08-09 LAB — SURGICAL PATHOLOGY

## 2023-08-10 ENCOUNTER — Telehealth: Payer: Self-pay | Admitting: *Deleted

## 2023-08-10 ENCOUNTER — Encounter: Admitting: Physical Therapy

## 2023-08-10 ENCOUNTER — Encounter: Payer: Self-pay | Admitting: *Deleted

## 2023-08-10 NOTE — Telephone Encounter (Signed)
Received order for Oncotype Testing per Dr. Chryl Heck. Requisition faxed to pathology and [facility]

## 2023-08-12 ENCOUNTER — Ambulatory Visit (HOSPITAL_BASED_OUTPATIENT_CLINIC_OR_DEPARTMENT_OTHER): Admitting: Physical Therapy

## 2023-08-17 ENCOUNTER — Ambulatory Visit: Attending: Surgery | Admitting: Physical Therapy

## 2023-08-19 ENCOUNTER — Ambulatory Visit (HOSPITAL_BASED_OUTPATIENT_CLINIC_OR_DEPARTMENT_OTHER): Admitting: Physical Therapy

## 2023-08-19 DIAGNOSIS — L821 Other seborrheic keratosis: Secondary | ICD-10-CM | POA: Diagnosis not present

## 2023-08-19 DIAGNOSIS — L718 Other rosacea: Secondary | ICD-10-CM | POA: Diagnosis not present

## 2023-08-19 DIAGNOSIS — L738 Other specified follicular disorders: Secondary | ICD-10-CM | POA: Diagnosis not present

## 2023-08-19 DIAGNOSIS — Z85828 Personal history of other malignant neoplasm of skin: Secondary | ICD-10-CM | POA: Diagnosis not present

## 2023-08-19 DIAGNOSIS — C44529 Squamous cell carcinoma of skin of other part of trunk: Secondary | ICD-10-CM | POA: Diagnosis not present

## 2023-08-22 ENCOUNTER — Ambulatory Visit (HOSPITAL_BASED_OUTPATIENT_CLINIC_OR_DEPARTMENT_OTHER): Admitting: Physical Therapy

## 2023-08-22 DIAGNOSIS — J069 Acute upper respiratory infection, unspecified: Secondary | ICD-10-CM | POA: Diagnosis not present

## 2023-08-24 ENCOUNTER — Encounter: Admitting: Physical Therapy

## 2023-08-27 DIAGNOSIS — C50411 Malignant neoplasm of upper-outer quadrant of right female breast: Secondary | ICD-10-CM | POA: Diagnosis not present

## 2023-08-27 DIAGNOSIS — Z170 Estrogen receptor positive status [ER+]: Secondary | ICD-10-CM | POA: Diagnosis not present

## 2023-08-29 ENCOUNTER — Encounter: Payer: Self-pay | Admitting: *Deleted

## 2023-08-29 ENCOUNTER — Telehealth: Payer: Self-pay | Admitting: *Deleted

## 2023-08-29 ENCOUNTER — Telehealth: Payer: Self-pay

## 2023-08-29 ENCOUNTER — Ambulatory Visit (HOSPITAL_BASED_OUTPATIENT_CLINIC_OR_DEPARTMENT_OTHER): Admitting: Physical Therapy

## 2023-08-29 DIAGNOSIS — Z170 Estrogen receptor positive status [ER+]: Secondary | ICD-10-CM

## 2023-08-29 NOTE — Telephone Encounter (Signed)
 Received oncotype results of 16/4%. Appt with Dr. Loretha 7/1 Referral placed for Dr. Shannon.

## 2023-08-29 NOTE — Telephone Encounter (Signed)
 Left message to confirm appt for 7/1

## 2023-08-30 ENCOUNTER — Inpatient Hospital Stay: Attending: Hematology and Oncology | Admitting: Hematology and Oncology

## 2023-08-30 ENCOUNTER — Encounter (HOSPITAL_COMMUNITY): Payer: Self-pay

## 2023-08-30 VITALS — BP 128/57 | HR 85 | Temp 97.4°F | Resp 19 | Wt 280.5 lb

## 2023-08-30 DIAGNOSIS — C50411 Malignant neoplasm of upper-outer quadrant of right female breast: Secondary | ICD-10-CM | POA: Diagnosis present

## 2023-08-30 DIAGNOSIS — Z1721 Progesterone receptor positive status: Secondary | ICD-10-CM | POA: Insufficient documentation

## 2023-08-30 DIAGNOSIS — Z1732 Human epidermal growth factor receptor 2 negative status: Secondary | ICD-10-CM | POA: Diagnosis not present

## 2023-08-30 DIAGNOSIS — Z79811 Long term (current) use of aromatase inhibitors: Secondary | ICD-10-CM | POA: Diagnosis not present

## 2023-08-30 DIAGNOSIS — Z170 Estrogen receptor positive status [ER+]: Secondary | ICD-10-CM | POA: Diagnosis not present

## 2023-08-30 DIAGNOSIS — Z87891 Personal history of nicotine dependence: Secondary | ICD-10-CM | POA: Diagnosis not present

## 2023-08-30 NOTE — Progress Notes (Signed)
 [facility] CONSULT NOTE  Patient Care Team: Teresa Channel, MD as PCP - General (Family Medicine) Tyree Nanetta SAILOR, RN as Oncology Nurse Navigator Glean, Stephane BROCKS, RN (Inactive) as Oncology Nurse Navigator Vanderbilt Ned, MD as Consulting Physician (General Surgery) Loretha Ash, MD as Consulting Physician (Hematology and Oncology) Shannon Agent, MD as Consulting Physician (Radiation Oncology)  CHIEF COMPLAINTS/PURPOSE OF CONSULTATION:  Newly diagnosed breast cancer  HISTORY OF PRESENTING ILLNESS:  Tammy Mccoy 73 y.o. female is here because of recent diagnosis of right breast cancer  I reviewed her records extensively and collaborated the history with the patient.  SUMMARY OF ONCOLOGIC HISTORY: Oncology History  Malignant neoplasm of upper-outer quadrant of right breast in female, estrogen receptor positive (HCC)  05/24/2017 Genetic Testing   No pathogenic variants were detected. Of note, a VUS was identified in the MET gene (c.1030G>A (p.Gly344Arg)) on the multicancer panel. Report date is 05/25/2017.  Update: The MET VUS (c.1030G>A) has been reclassified to likely benign. Report date is 07/13/2021.  The Multi-Gene Panel offered by [facility] includes sequencing and/or deletion duplication testing of the following 83 genes: ALK, APC, ATM, AXIN2,BAP1,  BARD1, BLM, BMPR1A, BRCA1, BRCA2, BRIP1, CASR, CDC73, CDH1, CDK4, CDKN1B, CDKN1C, CDKN2A (p14ARF), CDKN2A (p16INK4a), CEBPA, CHEK2, CTNNA1, DICER1, DIS3L2, EGFR (c.2369C>T, p.Thr790Met variant only), EPCAM (Deletion/duplication testing only), FH, FLCN, GATA2, GPC3, GREM1 (Promoter region deletion/duplication testing only), HOXB13 (c.251G>A, p.Gly84Glu), HRAS, KIT, MAX, MEN1, MET, MITF (c.952G>A, p.Glu318Lys variant only), MLH1, MSH2, MSH3, MSH6, MUTYH, NBN, NF1, NF2, NTHL1, PALB2, PDGFRA, PHOX2B, PMS2, POLD1, POLE, POT1, PRKAR1A, PTCH1, PTEN, RAD50, RAD51C, RAD51D, RB1, RECQL4, RET, RUNX1, SDHAF2, SDHA (sequence changes  only), SDHB, SDHC, SDHD, SMAD4, SMARCA4, SMARCB1, SMARCE1, STK11, SUFU, TERT, TERT, TMEM127, TP53, TSC1, TSC2, VHL, WRN and WT1.  The report date is May 24, 2017.   06/27/2023 Initial Diagnosis   Malignant neoplasm of upper-outer quadrant of right breast in female, estrogen receptor positive (HCC)   06/29/2023 Cancer Staging   Staging form: Breast, AJCC 8th Edition - Clinical stage from 06/29/2023: Stage IA (cT1b, cN0, cM0, G2, ER+, PR+, HER2-) - Signed by Loretha Ash, MD on 06/29/2023 Stage prefix: Initial diagnosis Histologic grading system: 3 grade system     Discussed the use of AI scribe software for clinical note transcription with the patient, who gave verbal consent to proceed.  History of Present Illness Tammy Mccoy is a 73 year old female with a malignant breast mass who presents for oncology consultation.      MEDICAL HISTORY:  Past Medical History:  Diagnosis Date   Alcohol abuse    pt stopped drinking alcohol around 2009   Anxiety    Arthritis    bil knees and neck   Asthma    excersice   B12 deficiency    Back pain    Breast cancer (HCC) 06/2023   right breast IDC   Claustrophobia    Claustrophobia    Colon polyps    Constipation    COPD (chronic obstructive pulmonary disease) (HCC)    Depression    Diabetes mellitus without complication (HCC)    Difficult intubation 06/11/2016   glidescope grade 2, pt states there was no difficulty with airway during cholecystectomy   Diverticulitis    Family history of adverse reaction to anesthesia    sister has vomiting   Family history of bladder cancer    Family history of breast cancer    Family history of colon cancer    Family history of thyroid  cancer  Former smoker    H/O rosacea    Hyperlipidemia    Hyperparathyroidism (HCC)    Hypertension    Joint pain    Multiple thyroid  nodules    Multiple thyroid  nodules    benign has checked yearly   Multiple thyroid  nodules    benign per  pt   Palpitations    Skin cancer, basal cell    Sleep apnea    no cpap   SOB (shortness of breath)    Squamous cell skin cancer    Swelling of both lower extremities    Thyroid  disease    Vitamin D deficiency     SURGICAL HISTORY: Past Surgical History:  Procedure Laterality Date   BREAST LUMPECTOMY WITH RADIOACTIVE SEED LOCALIZATION Right 08/02/2023   Procedure: BREAST LUMPECTOMY WITH RADIOACTIVE SEED LOCALIZATION;  Surgeon: Vanderbilt Ned, MD;  Location: [facility];  Service: General;  Laterality: Right;  RIGHT BREAST SEED LUMPECTOMY   CARPAL TUNNEL RELEASE     Bilateral   CESAREAN SECTION     x 2   CHOLECYSTECTOMY     ENDOVENOUS ABLATION SAPHENOUS VEIN W/ LASER Left 11/16/2016   endovenous laser ablation L GSV and stab phlebectomy > 20 incisions left leg by Lynwood Collum MD    PARATHYROIDECTOMY Left 06/11/2016   Procedure: LEFT PARATHYROIDECTOMY;  Surgeon: Krystal Spinner, MD;  Location: [facility];  Service: General;  Laterality: Left;   TONSILLECTOMY AND ADENOIDECTOMY     TUBAL LIGATION     VULVECTOMY N/A 02/16/2023   Procedure: LYNDLE;  Surgeon: Rosalva Sawyer, MD;  Location: [facility];  Service: Gynecology;  Laterality: N/A;    SOCIAL HISTORY: Social History   Socioeconomic History   Marital status: Widowed    Spouse name: Not on file   Number of children: 2   Years of education: Not on file   Highest education level: Not on file  Occupational History   Occupation: retired  Tobacco Use   Smoking status: Former    Current packs/day: 0.00    Average packs/day: 1 pack/day for 38.0 years (38.0 ttl pk-yrs)    Types: Cigarettes    Start date: 03/01/1970    Quit date: 03/01/2008    Years since quitting: 15.5   Smokeless tobacco: Never  Vaping Use   Vaping status: Never Used  Substance and Sexual Activity   Alcohol use: No    Comment: pt quit drinking around 2009   Drug use: No   Sexual activity: Not Currently    Birth control/protection: Post-menopausal,  Surgical    Comment: BTL  Other Topics Concern   Not on file  Social History Narrative   Used to drink alcohol.  None in the last 10 years.   Son lives with her.    She has 3 grands.  She helps take care of them.    Social Drivers of Corporate investment banker Strain: Not on file  Food Insecurity: No Food Insecurity (06/29/2023)   Hunger Vital Sign    Worried About Running Out of Food in the Last Year: Never true    Ran Out of Food in the Last Year: Never true  Transportation Needs: No Transportation Needs (06/29/2023)   PRAPARE - Administrator, Civil Service (Medical): No    Lack of Transportation (Non-Medical): No  Physical Activity: Not on file  Stress: Not on file  Social Connections: Not on file  Intimate Partner Violence: Not At Risk (06/29/2023)   Humiliation, Afraid, Rape, and  Kick questionnaire    Fear of Current or Ex-Partner: No    Emotionally Abused: No    Physically Abused: No    Sexually Abused: No    FAMILY HISTORY: Family History  Problem Relation Age of Onset   Cancer Father        Bladder   Depression Father    Alcoholism Father    CVA Mother 26   Thyroid  cancer Mother        dx in her 23s   Colon cancer Mother        dx in her 67s, d. 19   Lung cancer Mother        dx in 83s   Cancer Mother        parotid gland cancer, d. 71   Stroke Mother    Depression Mother    Anxiety disorder Mother    Alcoholism Mother    Hyperparathyroidism Sister    Diabetes Maternal Grandmother    COPD Maternal Grandfather    Breast cancer Paternal Grandmother        dx in 65s; d. 30   Thyroid  cancer Sister        dx in 55s, 2nd time at 79   Breast cancer Sister 54       second at 16   Thyroid  cancer Other        dx late 27s; daughter of sister with thyroid  cancer    ALLERGIES:  is allergic to myrbetriq [mirabegron], amoxicillin, augmentin [amoxicillin-pot clavulanate], bupropion, clindamycin /lincomycin, doxycycline, duloxetine, ibuprofen, oxybutynin  chloride, ozempic (0.25 or 0.5 mg-dose) [semaglutide(0.25 or 0.5mg -dos)], rosuvastatin, and trintellix [vortioxetine].  MEDICATIONS:  Current Outpatient Medications  Medication Sig Dispense Refill   acetaminophen  (TYLENOL ) 500 MG tablet Take 2 tablets (1,000 mg total) by mouth every 8 (eight) hours as needed for mild pain (pain score 1-3). 30 tablet 0   acetaminophen  (TYLENOL ) 650 MG CR tablet Take 1,300 mg by mouth every 8 (eight) hours as needed for pain.     ALPRAZolam (XANAX) 0.5 MG tablet Take 0.25-0.5 mg by mouth at bedtime as needed for sleep.      Cholecalciferol (VITAMIN D3) 2000 units capsule Take 2,000 Units by mouth daily.     Cyanocobalamin (B-12 PO) Take 1,000 mcg by mouth daily.     FLUoxetine (PROZAC) 10 MG capsule Take 30 mg by mouth daily.     fluticasone (FLONASE) 50 MCG/ACT nasal spray Place 1 spray into both nostrils daily as needed for allergies or rhinitis.     ketoconazole (NIZORAL) 2 % cream Apply 1 Application topically daily as needed (itching).     metFORMIN (GLUCOPHAGE-XR) 500 MG 24 hr tablet Take 500 mg by mouth at bedtime.     Multiple Vitamin (MULTIVITAMIN WITH MINERALS) TABS tablet Take 1 tablet by mouth daily.     naproxen  sodium (ALEVE ) 220 MG tablet Take 220 mg by mouth.     pravastatin (PRAVACHOL) 10 MG tablet Take 10 mg by mouth 2 (two) times a week.     traMADol  (ULTRAM ) 50 MG tablet Take 1 tablet (50 mg total) by mouth every 6 (six) hours as needed for moderate pain (pain score 4-6) or severe pain (pain score 7-10). 10 tablet 0   triamterene-hydrochlorothiazide  (MAXZIDE-25) 37.5-25 MG tablet Take 0.5 tablets by mouth every morning.     No current facility-administered medications for this visit.    REVIEW OF SYSTEMS:   Constitutional: Denies fevers, chills or abnormal night sweats Eyes: Denies blurriness of vision, double  vision or watery eyes Ears, nose, mouth, throat, and face: Denies mucositis or sore throat Respiratory: Denies cough, dyspnea or  wheezes Cardiovascular: Denies palpitation, chest discomfort or lower extremity swelling Gastrointestinal:  Denies nausea, heartburn or change in bowel habits Skin: Denies abnormal skin rashes Lymphatics: Denies new lymphadenopathy or easy bruising Neurological:Denies numbness, tingling or new weaknesses Behavioral/Psych: Mood is stable, no new changes  Breast: Denies any palpable lumps or discharge All other systems were reviewed with the patient and are negative.  PHYSICAL EXAMINATION: ECOG PERFORMANCE STATUS: 0 - Asymptomatic  Vitals:   08/30/23 1055 08/30/23 1056  BP: (!) 155/62 (!) 128/57  Pulse: 85   Resp: 19   Temp: (!) 97.4 F (36.3 C)   SpO2: 97%    Filed Weights   08/30/23 1055  Weight: 280 lb 8 oz (127.2 kg)    GENERAL:alert, no distress and comfortable NECK: supple, thyroid  normal size, non-tender, without nodularity LYMPH:  no palpable lymphadenopathy in the cervical, axillary  LUNGS: clear to auscultation and percussion with normal breathing effort HEART: regular rate & rhythm and no murmurs and no lower extremity edema ABDOMEN:abdomen soft, non-tender and normal bowel sounds Musculoskeletal:no cyanosis of digits and no clubbing  PSYCH: alert & oriented x 3 with fluent speech NEURO: no focal motor/sensory deficits BREAST: post biopsy changes in the breast.  LABORATORY DATA:  I have reviewed the data as listed Lab Results  Component Value Date   WBC 9.3 06/29/2023   HGB 14.3 06/29/2023   HCT 43.5 06/29/2023   MCV 89.7 06/29/2023   PLT 313 06/29/2023   Lab Results  Component Value Date   NA 134 (L) 07/28/2023   K 4.5 07/28/2023   CL 99 07/28/2023   CO2 27 07/28/2023    RADIOGRAPHIC STUDIES: I have personally reviewed the radiological reports and agreed with the findings in the report.  ASSESSMENT AND PLAN:  No problem-specific Assessment & Plan notes found for this encounter.   All questions were answered. The patient knows to call the clinic  with any problems, questions or concerns.    Amber Stalls, MD 08/30/23

## 2023-08-30 NOTE — Assessment & Plan Note (Signed)
 Assessment and Plan Assessment & Plan  This is a very pleasant 73 year old postmenopausal female patient with newly diagnosed right breast grade 1 IDC ER/PR positive, HER2 negative referred to breast MDC for additional recommendations. We have reviewed details from the pathology, reviewed role of Oncotype DX and antiestrogen therapy.  Discussed the following details about Oncotype Dx antiestrogen therapy.We have discussed about Oncotype Dx score which is a well validated prognostic scoring system which can predict outcome with endocrine therapy alone and whether chemotherapy reduces recurrence.  Typically in patients with ER positive cancers that are node negative if the RS score is high typically greater than or equal to 26, chemotherapy is recommended.  We have discussed options for antiestrogen therapy today. With regards to Tamoxifen, we discussed that this is a SERM, selective estrogen receptor modulator. We discussed mechanism of action of Tamoxifen, adverse effects on Tamoxifen including but not limited to post menopausal symptoms, increased risk of DVT/PE, increased risk of endometrial cancer, questionable cataracts with long term use and increased risk of cardiovascular events in the study which was not statistically significant. A benefit from Tamoxifen would be improvement in bone density. With regards to aromatase inhibitors, we discussed mechanism of action, adverse effects including but not limited to post menopausal symptoms, arthralgias, myalgias, increased risk of cardiovascular events and bone loss.  Stage 1 ductal carcinoma, 6 mm, hormone receptor-positive, lymph nodes normal. - Conduct Oncotype testing to assess chemotherapy necessity. - Administer antiestrogen medication for five years when appropriate. - Monitor bone density due to potential bone loss with anti estrogen therapy. - Evaluate need for radiation therapy post-surgery.

## 2023-08-30 NOTE — Progress Notes (Signed)
 [facility] CONSULT NOTE  Patient Care Team: Teresa Channel, MD as PCP - General (Family Medicine) Tyree Nanetta SAILOR, RN as Oncology Nurse Navigator Glean, Stephane BROCKS, RN (Inactive) as Oncology Nurse Navigator Vanderbilt Ned, MD as Consulting Physician (General Surgery) Loretha Ash, MD as Consulting Physician (Hematology and Oncology) Shannon Agent, MD as Consulting Physician (Radiation Oncology)  CHIEF COMPLAINTS/PURPOSE OF CONSULTATION:  Newly diagnosed breast cancer  HISTORY OF PRESENTING ILLNESS:  Tammy Mccoy 73 y.o. female is here because of recent diagnosis of right breast cancer  I reviewed her records extensively and collaborated the history with the patient.  SUMMARY OF ONCOLOGIC HISTORY: Oncology History  Malignant neoplasm of upper-outer quadrant of right breast in female, estrogen receptor positive (HCC)  05/24/2017 Genetic Testing   No pathogenic variants were detected. Of note, a VUS was identified in the MET gene (c.1030G>A (p.Gly344Arg)) on the multicancer panel. Report date is 05/25/2017.  Update: The MET VUS (c.1030G>A) has been reclassified to likely benign. Report date is 07/13/2021.  The Multi-Gene Panel offered by [facility] includes sequencing and/or deletion duplication testing of the following 83 genes: ALK, APC, ATM, AXIN2,BAP1,  BARD1, BLM, BMPR1A, BRCA1, BRCA2, BRIP1, CASR, CDC73, CDH1, CDK4, CDKN1B, CDKN1C, CDKN2A (p14ARF), CDKN2A (p16INK4a), CEBPA, CHEK2, CTNNA1, DICER1, DIS3L2, EGFR (c.2369C>T, p.Thr790Met variant only), EPCAM (Deletion/duplication testing only), FH, FLCN, GATA2, GPC3, GREM1 (Promoter region deletion/duplication testing only), HOXB13 (c.251G>A, p.Gly84Glu), HRAS, KIT, MAX, MEN1, MET, MITF (c.952G>A, p.Glu318Lys variant only), MLH1, MSH2, MSH3, MSH6, MUTYH, NBN, NF1, NF2, NTHL1, PALB2, PDGFRA, PHOX2B, PMS2, POLD1, POLE, POT1, PRKAR1A, PTCH1, PTEN, RAD50, RAD51C, RAD51D, RB1, RECQL4, RET, RUNX1, SDHAF2, SDHA (sequence changes  only), SDHB, SDHC, SDHD, SMAD4, SMARCA4, SMARCB1, SMARCE1, STK11, SUFU, TERT, TERT, TMEM127, TP53, TSC1, TSC2, VHL, WRN and WT1.  The report date is May 24, 2017.   06/27/2023 Initial Diagnosis   Malignant neoplasm of upper-outer quadrant of right breast in female, estrogen receptor positive (HCC)   06/29/2023 Cancer Staging   Staging form: Breast, AJCC 8th Edition - Clinical stage from 06/29/2023: Stage IA (cT1b, cN0, cM0, G2, ER+, PR+, HER2-) - Signed by Loretha Ash, MD on 06/29/2023 Stage prefix: Initial diagnosis Histologic grading system: 3 grade system     Discussed the use of AI scribe software for clinical note transcription with the patient, who gave verbal consent to proceed.  History of Present Illness   Tammy Mccoy is a 73 year old female with breast cancer who presents for follow-up after surgery.  She underwent a lumpectomy, and the pathology report indicated an 8 mm tumor with some noninvasive breast cancer, both of which have been removed. The tumor is estrogen and progesterone receptor positive with an intermediate growth rate. Her Oncotype DX score is 16.  Radiation therapy has been recommended and is expected to last about four weeks, Monday through Friday.  She is retired and not currently taking any pain medication. She is taking B12 supplements.   MEDICAL HISTORY:  Past Medical History:  Diagnosis Date   Alcohol abuse    pt stopped drinking alcohol around 2009   Anxiety    Arthritis    bil knees and neck   Asthma    excersice   B12 deficiency    Back pain    Breast cancer (HCC) 06/2023   right breast IDC   Claustrophobia    Claustrophobia    Colon polyps    Constipation    COPD (chronic obstructive pulmonary disease) (HCC)    Depression    Diabetes  mellitus without complication (HCC)    Difficult intubation 06/11/2016   glidescope grade 2, pt states there was no difficulty with airway during cholecystectomy   Diverticulitis     Family history of adverse reaction to anesthesia    sister has vomiting   Family history of bladder cancer    Family history of breast cancer    Family history of colon cancer    Family history of thyroid  cancer    Former smoker    H/O rosacea    Hyperlipidemia    Hyperparathyroidism (HCC)    Hypertension    Joint pain    Multiple thyroid  nodules    Multiple thyroid  nodules    benign has checked yearly   Multiple thyroid  nodules    benign per pt   Palpitations    Skin cancer, basal cell    Sleep apnea    no cpap   SOB (shortness of breath)    Squamous cell skin cancer    Swelling of both lower extremities    Thyroid  disease    Vitamin D deficiency     SURGICAL HISTORY: Past Surgical History:  Procedure Laterality Date   BREAST LUMPECTOMY WITH RADIOACTIVE SEED LOCALIZATION Right 08/02/2023   Procedure: BREAST LUMPECTOMY WITH RADIOACTIVE SEED LOCALIZATION;  Surgeon: Vanderbilt Ned, MD;  Location: [facility];  Service: General;  Laterality: Right;  RIGHT BREAST SEED LUMPECTOMY   CARPAL TUNNEL RELEASE     Bilateral   CESAREAN SECTION     x 2   CHOLECYSTECTOMY     ENDOVENOUS ABLATION SAPHENOUS VEIN W/ LASER Left 11/16/2016   endovenous laser ablation L GSV and stab phlebectomy > 20 incisions left leg by Lynwood Collum MD    PARATHYROIDECTOMY Left 06/11/2016   Procedure: LEFT PARATHYROIDECTOMY;  Surgeon: Krystal Spinner, MD;  Location: [facility];  Service: General;  Laterality: Left;   TONSILLECTOMY AND ADENOIDECTOMY     TUBAL LIGATION     VULVECTOMY N/A 02/16/2023   Procedure: LYNDLE;  Surgeon: Rosalva Sawyer, MD;  Location: [facility] OR;  Service: Gynecology;  Laterality: N/A;    SOCIAL HISTORY: Social History   Socioeconomic History   Marital status: Widowed    Spouse name: Not on file   Number of children: 2   Years of education: Not on file   Highest education level: Not on file  Occupational History   Occupation: retired  Tobacco Use   Smoking status: Former     Current packs/day: 0.00    Average packs/day: 1 pack/day for 38.0 years (38.0 ttl pk-yrs)    Types: Cigarettes    Start date: 03/01/1970    Quit date: 03/01/2008    Years since quitting: 15.5   Smokeless tobacco: Never  Vaping Use   Vaping status: Never Used  Substance and Sexual Activity   Alcohol use: No    Comment: pt quit drinking around 2009   Drug use: No   Sexual activity: Not Currently    Birth control/protection: Post-menopausal, Surgical    Comment: BTL  Other Topics Concern   Not on file  Social History Narrative   Used to drink alcohol.  None in the last 10 years.   Son lives with her.    She has 3 grands.  She helps take care of them.    Social Drivers of Corporate investment banker Strain: Not on file  Food Insecurity: No Food Insecurity (06/29/2023)   Hunger Vital Sign    Worried About Running Out of Food  in the Last Year: Never true    Ran Out of Food in the Last Year: Never true  Transportation Needs: No Transportation Needs (06/29/2023)   PRAPARE - Administrator, Civil Service (Medical): No    Lack of Transportation (Non-Medical): No  Physical Activity: Not on file  Stress: Not on file  Social Connections: Not on file  Intimate Partner Violence: Not At Risk (06/29/2023)   Humiliation, Afraid, Rape, and Kick questionnaire    Fear of Current or Ex-Partner: No    Emotionally Abused: No    Physically Abused: No    Sexually Abused: No    FAMILY HISTORY: Family History  Problem Relation Age of Onset   Cancer Father        Bladder   Depression Father    Alcoholism Father    CVA Mother 54   Thyroid  cancer Mother        dx in her 10s   Colon cancer Mother        dx in her 66s, d. 37   Lung cancer Mother        dx in 24s   Cancer Mother        parotid gland cancer, d. 63   Stroke Mother    Depression Mother    Anxiety disorder Mother    Alcoholism Mother    Hyperparathyroidism Sister    Diabetes Maternal Grandmother    COPD Maternal  Grandfather    Breast cancer Paternal Grandmother        dx in 4s; d. 20   Thyroid  cancer Sister        dx in 61s, 2nd time at 11   Breast cancer Sister 53       second at 58   Thyroid  cancer Other        dx late 84s; daughter of sister with thyroid  cancer    ALLERGIES:  is allergic to myrbetriq [mirabegron], amoxicillin, augmentin [amoxicillin-pot clavulanate], bupropion, clindamycin /lincomycin, doxycycline, duloxetine, ibuprofen, oxybutynin chloride, ozempic (0.25 or 0.5 mg-dose) [semaglutide(0.25 or 0.5mg -dos)], rosuvastatin, and trintellix [vortioxetine].  MEDICATIONS:  Current Outpatient Medications  Medication Sig Dispense Refill   acetaminophen  (TYLENOL ) 500 MG tablet Take 2 tablets (1,000 mg total) by mouth every 8 (eight) hours as needed for mild pain (pain score 1-3). 30 tablet 0   acetaminophen  (TYLENOL ) 650 MG CR tablet Take 1,300 mg by mouth every 8 (eight) hours as needed for pain.     ALPRAZolam (XANAX) 0.5 MG tablet Take 0.25-0.5 mg by mouth at bedtime as needed for sleep.      Cholecalciferol (VITAMIN D3) 2000 units capsule Take 2,000 Units by mouth daily.     Cyanocobalamin (B-12 PO) Take 1,000 mcg by mouth daily.     FLUoxetine (PROZAC) 10 MG capsule Take 30 mg by mouth daily.     fluticasone (FLONASE) 50 MCG/ACT nasal spray Place 1 spray into both nostrils daily as needed for allergies or rhinitis.     ketoconazole (NIZORAL) 2 % cream Apply 1 Application topically daily as needed (itching).     metFORMIN (GLUCOPHAGE-XR) 500 MG 24 hr tablet Take 500 mg by mouth at bedtime.     Multiple Vitamin (MULTIVITAMIN WITH MINERALS) TABS tablet Take 1 tablet by mouth daily.     naproxen  sodium (ALEVE ) 220 MG tablet Take 220 mg by mouth.     pravastatin (PRAVACHOL) 10 MG tablet Take 10 mg by mouth 2 (two) times a week.     traMADol  (ULTRAM ) 50  MG tablet Take 1 tablet (50 mg total) by mouth every 6 (six) hours as needed for moderate pain (pain score 4-6) or severe pain (pain score  7-10). 10 tablet 0   triamterene-hydrochlorothiazide  (MAXZIDE-25) 37.5-25 MG tablet Take 0.5 tablets by mouth every morning.     No current facility-administered medications for this visit.    REVIEW OF SYSTEMS:   Constitutional: Denies fevers, chills or abnormal night sweats Eyes: Denies blurriness of vision, double vision or watery eyes Ears, nose, mouth, throat, and face: Denies mucositis or sore throat Respiratory: Denies cough, dyspnea or wheezes Cardiovascular: Denies palpitation, chest discomfort or lower extremity swelling Gastrointestinal:  Denies nausea, heartburn or change in bowel habits Skin: Denies abnormal skin rashes Lymphatics: Denies new lymphadenopathy or easy bruising Neurological:Denies numbness, tingling or new weaknesses Behavioral/Psych: Mood is stable, no new changes  Breast: Denies any palpable lumps or discharge All other systems were reviewed with the patient and are negative.  PHYSICAL EXAMINATION: ECOG PERFORMANCE STATUS: 0 - Asymptomatic  Vitals:   08/30/23 1055 08/30/23 1056  BP: (!) 155/62 (!) 128/57  Pulse: 85   Resp: 19   Temp: (!) 97.4 F (36.3 C)   SpO2: 97%    Filed Weights   08/30/23 1055  Weight: 280 lb 8 oz (127.2 kg)    GENERAL:alert, no distress and comfortable   LABORATORY DATA:  I have reviewed the data as listed Lab Results  Component Value Date   WBC 9.3 06/29/2023   HGB 14.3 06/29/2023   HCT 43.5 06/29/2023   MCV 89.7 06/29/2023   PLT 313 06/29/2023   Lab Results  Component Value Date   NA 134 (L) 07/28/2023   K 4.5 07/28/2023   CL 99 07/28/2023   CO2 27 07/28/2023    RADIOGRAPHIC STUDIES: I have personally reviewed the radiological reports and agreed with the findings in the report.  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 Ki 67 20%. Oncotype DX score of 16 indicates low recurrence risk, no chemotherapy  benefit. Radiation therapy recommended. 9-year recurrence risk ~4% with radiation and anti-estrogen therapy. - Coordinate with radiation oncology for four weeks of radiation therapy, Monday through Friday. - Inform Dr. Shannon chemotherapy is not needed. - Schedule follow-up post-radiation to initiate anti-estrogen therapy.  Estrogen receptor positive status Anti-estrogen therapy planned to reduce recurrence risk. Therapy aims to lower estrogen levels,   Common side effects include mild hot flashes, vaginal dryness, arthralgias. Long-term side effect includes bone density loss. - Initiate anti-estrogen therapy with  anastrozole daily pill for five years post-radiation. - Monitor bone density every two years. - Advise on bone health: engage in weight-bearing exercises, ensure adequate vitamin D and calcium intake.  Time spent: 20 min  All questions were answered. The patient knows to call the clinic with any problems, questions or concerns.    Amber Stalls, MD 08/30/23

## 2023-08-31 ENCOUNTER — Encounter: Admitting: Rehabilitative and Restorative Service Providers"

## 2023-09-05 ENCOUNTER — Ambulatory Visit (HOSPITAL_BASED_OUTPATIENT_CLINIC_OR_DEPARTMENT_OTHER): Admitting: Physical Therapy

## 2023-09-07 ENCOUNTER — Encounter: Admitting: Rehabilitative and Restorative Service Providers"

## 2023-09-08 ENCOUNTER — Encounter: Payer: Self-pay | Admitting: *Deleted

## 2023-09-08 ENCOUNTER — Telehealth: Payer: Self-pay | Admitting: Radiation Oncology

## 2023-09-08 NOTE — Telephone Encounter (Signed)
 7/10 Called patient after received voicemail to confirm her appt date/time for 7/14.  She is grateful for call back.

## 2023-09-09 NOTE — Progress Notes (Signed)
 Radiation Oncology         [phone]) [phone] ________________________________  Name: Tammy Mccoy MRN: [id number]  Date: 09/12/2023  DOB: 03-07-1950  Re-Evaluation Note  CC: Teresa Channel, MD  Loretha Ash, MD  No diagnosis found.  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   Narrative:  The patient returns today to discuss radiation treatment options. She was seen in the multidisciplinary breast clinic on 06/29/23.   Since her consultation date, she opted to proceed with a right breast lumpectomy without nodal biopsies on 08/02/23 under the care of Dr. Vanderbilt.  Pathology from the procedure revealed: tumor the size of 0.8 cm; histology of grade 2 invasive ductal carcinoma with low-intermediate grade DCIS; all margins negative for invasive and in situ carcinoma; margin status to invasive and in situ disease of 4 mm from the anterior margin. Prognostic indicators significant for: estrogen receptor 90% positive with strong staining intensity; progesterone receptor 20% positive with moderate to strong staining intensity; Proliferation marker Ki67 at 20%; Her2 status negative; Grade 2.   Oncotype DX was obtained on the final surgical sample and showed a recurrence score of 16 which predicts a risk of recurrence outside the breast over the next 9 years of 4%, if the patient's only systemic therapy were to be an antiestrogen for 5 years.  It also predicts so significant benefit from chemotherapy.  She accordingly followed up with Dr. Loretha on 08/30/23. Chemotherapy is not indicated based on her Oncotype Dx results and she has agreed to proceed with antiestrogen therapy consisting of anastrozole for 5 years after completing XRT based on Dr. Walter recommendations.   Of note: Her most recent lung cancer screening CT on 07/18/23 showed no abnormal findings/benign  findings.   Of additional note: Genetic testing was not pursued in light of her prior genetic testing performed in 2019 which showed no clinically significant variants.   On review of systems, the patient reports ***. She denies *** and any other symptoms.    Allergies:  is allergic to myrbetriq [mirabegron], amoxicillin, augmentin [amoxicillin-pot clavulanate], bupropion, clindamycin /lincomycin, doxycycline, duloxetine, ibuprofen, oxybutynin chloride, ozempic (0.25 or 0.5 mg-dose) [semaglutide(0.25 or 0.5mg -dos)], rosuvastatin, and trintellix [vortioxetine].  Meds: Current Outpatient Medications  Medication Sig Dispense Refill   acetaminophen  (TYLENOL ) 500 MG tablet Take 2 tablets (1,000 mg total) by mouth every 8 (eight) hours as needed for mild pain (pain score 1-3). 30 tablet 0   acetaminophen  (TYLENOL ) 650 MG CR tablet Take 1,300 mg by mouth every 8 (eight) hours as needed for pain.     ALPRAZolam (XANAX) 0.5 MG tablet Take 0.25-0.5 mg by mouth at bedtime as needed for sleep.      Cholecalciferol (VITAMIN D3) 2000 units capsule Take 2,000 Units by mouth daily.     Cyanocobalamin (B-12 PO) Take 1,000 mcg by mouth daily.     FLUoxetine (PROZAC) 10 MG capsule Take 30 mg by mouth daily.     fluticasone (FLONASE) 50 MCG/ACT nasal spray Place 1 spray into both nostrils daily as needed for allergies or rhinitis.     ketoconazole (NIZORAL) 2 % cream Apply 1 Application topically daily as needed (itching).     metFORMIN (GLUCOPHAGE-XR) 500 MG 24 hr tablet Take 500 mg by mouth at bedtime.     Multiple Vitamin (MULTIVITAMIN WITH MINERALS) TABS tablet Take 1 tablet by mouth daily.     naproxen  sodium (  ALEVE ) 220 MG tablet Take 220 mg by mouth.     pravastatin (PRAVACHOL) 10 MG tablet Take 10 mg by mouth 2 (two) times a week.     triamterene-hydrochlorothiazide  (MAXZIDE-25) 37.5-25 MG tablet Take 0.5 tablets by mouth every morning.     No current facility-administered medications for this  encounter.    Physical Findings: The patient is in no acute distress. Patient is alert and oriented.  vitals were not taken for this visit.  No significant changes. Lungs are clear to auscultation bilaterally. Heart has regular rate and rhythm. No palpable cervical, supraclavicular, or axillary adenopathy. Abdomen soft, non-tender, normal bowel sounds. Left Breast: no palpable mass, nipple discharge or bleeding. Right Breast: ***  Lab Findings: Lab Results  Component Value Date   WBC 9.3 06/29/2023   HGB 14.3 06/29/2023   HCT 43.5 06/29/2023   MCV 89.7 06/29/2023   PLT 313 06/29/2023    Radiographic Findings: No results found.  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   ***  Plan:  Patient is scheduled for CT simulation {date/later today}. ***  -----------------------------------  Lynwood CHARM Nasuti, PhD, MD  This document serves as a record of services personally performed by Lynwood Nasuti, MD. It was created on his behalf by Dorthy Fuse, a trained medical scribe. The creation of this record is based on the scribe's personal observations and the provider's statements to them. This document has been checked and approved by the attending provider.

## 2023-09-09 NOTE — Progress Notes (Signed)
 PROVIDER:  DEBBY CURTISTINE SHIPPER, MD  MRN: [id number] DOB: 10-30-1950 DATE OF ENCOUNTER: 09/09/2023 Subjective     Chief Complaint: Post Operative Visit     History of Present Illness: Tammy Mccoy is a 73 y.o. female who is seen today for postop check 1 month after right lumpectomy.  Margins are clear.  She is doing well..     Review of Systems: A complete review of systems was obtained from the patient.  I have reviewed this information and discussed as appropriate with the patient.  See HPI as well for other ROS.  ROS    Medical History: Past Medical History:  Diagnosis Date  . Anxiety   . Arthritis   . COPD (chronic obstructive pulmonary disease) (CMS/HHS-HCC)   . History of cancer   . Hypertension   . Sleep apnea     There is no problem list on file for this patient.   Past Surgical History:  Procedure Laterality Date  . carpal tunnel surgery N/A   . CHOLECYSTECTOMY    . csection surgery    . MASTECTOMY PARTIAL / LUMPECTOMY    . parathyroid  surgery       Allergies  Allergen Reactions  . Bupropion Other (See Comments)    Worsened depression  Worsened depression    . Ibuprofen Other (See Comments) and Itching    Lower lip swelling  Lower lip swelling    . Duloxetine Other (See Comments)  . Mirabegron Other (See Comments)  . Oxybutynin Chloride Other (See Comments)  . Rosuvastatin Other (See Comments)    Current Outpatient Medications on File Prior to Visit  Medication Sig Dispense Refill  . ALPRAZolam (XANAX) 0.5 MG tablet alprazolam 0.5 mg tablet  TAKE 1 2 TO 1 (ONE HALF TO ONE) TABLET BY MOUTH AT BEDTIME AS NEEDED FOR SLEEP    . FLUoxetine (PROZAC) 20 MG tablet 1 tablet (20 mg total)    . metFORMIN (GLUCOPHAGE-XR) 500 MG XR tablet TAKE 1 TABLET BY MOUTH ONCE DAILY IN THE EVENING WITH A MEAL    . naproxen  (NAPROSYN ) 500 MG tablet naproxen  500 mg tablet  TAKE 1 TABLET BY MOUTH TWICE DAILY    . pravastatin (PRAVACHOL) 10 MG tablet Take 1  tablet (10 mg total) by mouth twice a week    . triamterene-hydrochlorothiazide  (MAXZIDE-25) 37.5-25 mg tablet Take 1 tablet by mouth every morning (Patient not taking: Reported on 06/29/2023)     No current facility-administered medications on file prior to visit.    Family History  Problem Relation Age of Onset  . Stroke Mother   . Hyperlipidemia (Elevated cholesterol) Mother   . High blood pressure (Hypertension) Mother   . Obesity Sister   . Breast cancer Sister      Social History   Tobacco Use  Smoking Status Former  . Types: Cigarettes  Smokeless Tobacco Never     Social History   Socioeconomic History  . Marital status: Widowed  Tobacco Use  . Smoking status: Former    Types: Cigarettes  . Smokeless tobacco: Never  Vaping Use  . Vaping status: Never Used  Substance and Sexual Activity  . Alcohol use: Never  . Drug use: Never   Social Drivers of Health   Food Insecurity: No Food Insecurity (06/29/2023)   Received from [facility]   Hunger Vital Sign   . Within the past 12 months, you worried that your food would run out before you got the money to buy more.: Never true   .  Within the past 12 months, the food you bought just didn't last and you didn't have money to get more.: Never true  Transportation Needs: No Transportation Needs (06/29/2023)   Received from [facility] - Transportation   . Lack of Transportation (Medical): No   . Lack of Transportation (Non-Medical): No  Housing Stability: Unknown (09/09/2023)   Housing Stability Vital Sign   . Homeless in the Last Year: No    Objective:    Vitals:   09/09/23 0930  PainSc: 0-No pain    There is no height or weight on file to calculate BMI.  Physical Exam Exam conducted with a chaperone present (michelle brooks).         Labs, Imaging and Diagnostic Testing:   FINAL MICROSCOPIC DIAGNOSIS:   A. 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, cribriform  type with focal necrosis  Tumor measures 0.8 x 0.7 x 0.6 cm (pT1b)  Margins free (invasive and in situ tumor 4 mm from anterior margin)  Lymphovascular invasion not identified  Prognostic markers (from report [id number]): Estrogen receptor positive,  progesterone receptor positive, Ki-67 20% and HER2/neu oncoprotein  expression group 5 negative by FISH  Changes consistent with prior biopsy including clip   ONCOLOGY TABLE:   INVASIVE CARCINOMA OF THE BREAST:  Resection   Procedure: Lumpectomy  Specimen Laterality: Left  Histologic Type: Ductal carcinoma  Histologic Grade:       Glandular (Acinar)/Tubular Differentiation: 3       Nuclear Pleomorphism: 2       Mitotic Rate: 1       Overall Grade: Grade 2 (6/9)  Tumor Size: 0.8 x 0.7 x 0.6 cm  Ductal Carcinoma In Situ: Low to intermediate grade DCIS, cribriform  type with focal necrosis  Lymphatic and/or Vascular Invasion: Not identified  Treatment Effect in the Breast: No known presurgical therapy  Margins: All margins negative for invasive carcinoma       Distance from Closest Margin (mm): Invasive carcinoma 4 mm from  anterior margin  DCIS Margins: Uninvolved by DCIS       Distance from Closest Margin (mm): DCIS 4 mm from anterior margin  Regional Lymph Nodes: Not applicable (no lymph nodes submitted or found)  Distant Metastasis: Not applicable  Breast Biomarker Testing Performed on Previous Biopsy:       Testing Performed on Case Number: [id number]             Estrogen Receptor: Positive (90%, strong staining)             Progesterone Receptor: Positive (20%, moderate to strong  staining)            HER2: Group 5 negative by FISH             Ki-67: 20%  Pathologic Stage Classification (pTNM, AJCC 8th Edition): pT1b, pN n/a  Representative Tumor Block: A1  Comment(s): Immunohistochemical stains for the myoepithelial  markers,  calponin, smooth muscle heavy chain and p63, performed with adequate  control show absent staining within the invasive tumor and positive  staining within the scattered irregular angulated ducts and ductules in  the adjacent fibrotic stroma.  An immunohistochemical stain for  cytokeratin 5/6 is diffusely negative within the in situ disease.  These  immunohistochemical pattern support the above diagnoses.  (v4.5.0.0)   Assessment and Plan:     Diagnoses and all orders for  this visit:  Post-operative state  Doing well  Continue follow-up with medical and radiation oncology    No follow-ups on file.   DEBBY CURTISTINE SHIPPER, MD

## 2023-09-12 ENCOUNTER — Ambulatory Visit
Admission: RE | Admit: 2023-09-12 | Discharge: 2023-09-12 | Disposition: A | Discharge status: Home / Self Care | Source: Ambulatory Visit | Attending: Radiation Oncology | Admitting: Radiation Oncology

## 2023-09-12 ENCOUNTER — Encounter: Payer: Self-pay | Admitting: Radiation Oncology

## 2023-09-12 VITALS — BP 151/67 | HR 72 | Temp 97.7°F | Resp 24 | Ht 65.0 in | Wt 288.2 lb

## 2023-09-12 DIAGNOSIS — Z170 Estrogen receptor positive status [ER+]: Secondary | ICD-10-CM | POA: Insufficient documentation

## 2023-09-12 DIAGNOSIS — C50411 Malignant neoplasm of upper-outer quadrant of right female breast: Secondary | ICD-10-CM | POA: Insufficient documentation

## 2023-09-12 DIAGNOSIS — Z510 Encounter for antineoplastic radiation therapy: Secondary | ICD-10-CM | POA: Diagnosis not present

## 2023-09-12 DIAGNOSIS — Z7984 Long term (current) use of oral hypoglycemic drugs: Secondary | ICD-10-CM | POA: Diagnosis not present

## 2023-09-12 DIAGNOSIS — Z79899 Other long term (current) drug therapy: Secondary | ICD-10-CM | POA: Insufficient documentation

## 2023-09-12 NOTE — Progress Notes (Signed)
 Location of Breast Cancer: Malignant neoplasm of upper-outer quadrant of right breast in female, estrogen receptor positive   Histology per Pathology Report:    Receptor Status: ER(90%), PR (20%), Her2-neu (Group 5 Negative), Ki-67(20%)  Did patient present with symptoms (if so, please note symptoms) or was this found on screening mammography?:   Past/Anticipated interventions by surgeon, if any: Right BREAST LUMPECTOMY WITH RADIOACTIVE SEED LOCALIZATION with Vanderbilt Ned, MD  Date: 08/02/2023         Past/Anticipated interventions by medical oncology, if any: Patient to see Dr. Tressia 08/26  Lymphedema issues, if any:  no    Pain issues, if any:  yes generalized pain  SAFETY ISSUES: Prior radiation? no Pacemaker/ICD? no Possible current pregnancy?no Is the patient on methotrexate? no  Current Complaints / other details:      BP (!) 151/67 (BP Location: Left Arm, Patient Position: Sitting, Cuff Size: Large)   Pulse 72   Temp 97.7 F (36.5 C)   Resp (!) 24   Ht 5' 5 (1.651 m)   Wt 288 lb 3.2 oz (130.7 kg)   SpO2 99%   BMI 47.96 kg/m

## 2023-09-15 DIAGNOSIS — C50411 Malignant neoplasm of upper-outer quadrant of right female breast: Secondary | ICD-10-CM | POA: Diagnosis not present

## 2023-09-15 DIAGNOSIS — Z510 Encounter for antineoplastic radiation therapy: Secondary | ICD-10-CM | POA: Diagnosis not present

## 2023-09-15 DIAGNOSIS — Z170 Estrogen receptor positive status [ER+]: Secondary | ICD-10-CM | POA: Diagnosis not present

## 2023-09-19 ENCOUNTER — Ambulatory Visit
Admission: RE | Admit: 2023-09-19 | Discharge: 2023-09-19 | Disposition: A | Discharge status: Home / Self Care | Source: Ambulatory Visit | Attending: Radiation Oncology | Admitting: Radiation Oncology

## 2023-09-19 ENCOUNTER — Encounter: Payer: Self-pay | Admitting: *Deleted

## 2023-09-19 ENCOUNTER — Other Ambulatory Visit: Payer: Self-pay

## 2023-09-19 DIAGNOSIS — C50411 Malignant neoplasm of upper-outer quadrant of right female breast: Secondary | ICD-10-CM

## 2023-09-19 DIAGNOSIS — Z170 Estrogen receptor positive status [ER+]: Secondary | ICD-10-CM | POA: Diagnosis not present

## 2023-09-19 DIAGNOSIS — Z510 Encounter for antineoplastic radiation therapy: Secondary | ICD-10-CM | POA: Diagnosis not present

## 2023-09-19 LAB — RAD ONC ARIA SESSION SUMMARY
Course Elapsed Days: 0
Plan Fractions Treated to Date: 1
Plan Prescribed Dose Per Fraction: 1.8 Gy
Plan Total Fractions Prescribed: 28
Plan Total Prescribed Dose: 50.4 Gy
Reference Point Dosage Given to Date: 1.8 Gy
Reference Point Session Dosage Given: 1.8 Gy
Session Number: 1

## 2023-09-19 MED ORDER — RADIAPLEXRX EX GEL: Freq: Once | CUTANEOUS | Status: AC

## 2023-09-19 MED ORDER — ALRA NON-METALLIC DEODORANT (RAD-ONC)
1.0000 | Freq: Once | TOPICAL | Status: AC
  Administered 2023-09-19: 1 via TOPICAL

## 2023-09-20 ENCOUNTER — Ambulatory Visit

## 2023-09-20 ENCOUNTER — Ambulatory Visit
Admission: RE | Admit: 2023-09-20 | Discharge: 2023-09-20 | Discharge status: Home / Self Care | Source: Ambulatory Visit | Attending: Radiation Oncology | Admitting: Radiation Oncology

## 2023-09-20 ENCOUNTER — Other Ambulatory Visit: Payer: Self-pay

## 2023-09-20 DIAGNOSIS — Z510 Encounter for antineoplastic radiation therapy: Secondary | ICD-10-CM | POA: Diagnosis not present

## 2023-09-20 DIAGNOSIS — C50411 Malignant neoplasm of upper-outer quadrant of right female breast: Secondary | ICD-10-CM | POA: Diagnosis not present

## 2023-09-20 DIAGNOSIS — Z170 Estrogen receptor positive status [ER+]: Secondary | ICD-10-CM | POA: Diagnosis not present

## 2023-09-20 LAB — RAD ONC ARIA SESSION SUMMARY
Course Elapsed Days: 1
Plan Fractions Treated to Date: 2
Plan Prescribed Dose Per Fraction: 1.8 Gy
Plan Total Fractions Prescribed: 28
Plan Total Prescribed Dose: 50.4 Gy
Reference Point Dosage Given to Date: 3.6 Gy
Reference Point Session Dosage Given: 1.8 Gy
Session Number: 2

## 2023-09-21 ENCOUNTER — Other Ambulatory Visit: Payer: Self-pay

## 2023-09-21 ENCOUNTER — Ambulatory Visit
Admission: RE | Admit: 2023-09-21 | Discharge: 2023-09-21 | Disposition: A | Discharge status: Home / Self Care | Source: Ambulatory Visit | Attending: Radiation Oncology | Admitting: Radiation Oncology

## 2023-09-21 DIAGNOSIS — C50411 Malignant neoplasm of upper-outer quadrant of right female breast: Secondary | ICD-10-CM | POA: Diagnosis not present

## 2023-09-21 DIAGNOSIS — Z510 Encounter for antineoplastic radiation therapy: Secondary | ICD-10-CM | POA: Diagnosis not present

## 2023-09-21 DIAGNOSIS — Z170 Estrogen receptor positive status [ER+]: Secondary | ICD-10-CM | POA: Diagnosis not present

## 2023-09-21 LAB — RAD ONC ARIA SESSION SUMMARY
Course Elapsed Days: 2
Plan Fractions Treated to Date: 3
Plan Prescribed Dose Per Fraction: 1.8 Gy
Plan Total Fractions Prescribed: 28
Plan Total Prescribed Dose: 50.4 Gy
Reference Point Dosage Given to Date: 5.4 Gy
Reference Point Session Dosage Given: 1.8 Gy
Session Number: 3

## 2023-09-22 ENCOUNTER — Ambulatory Visit
Admission: RE | Admit: 2023-09-22 | Discharge: 2023-09-22 | Disposition: A | Discharge status: Home / Self Care | Source: Ambulatory Visit | Attending: Radiation Oncology | Admitting: Radiation Oncology

## 2023-09-22 ENCOUNTER — Other Ambulatory Visit: Payer: Self-pay

## 2023-09-22 DIAGNOSIS — Z510 Encounter for antineoplastic radiation therapy: Secondary | ICD-10-CM | POA: Diagnosis not present

## 2023-09-22 DIAGNOSIS — C50411 Malignant neoplasm of upper-outer quadrant of right female breast: Secondary | ICD-10-CM | POA: Diagnosis not present

## 2023-09-22 DIAGNOSIS — Z170 Estrogen receptor positive status [ER+]: Secondary | ICD-10-CM | POA: Diagnosis not present

## 2023-09-22 LAB — RAD ONC ARIA SESSION SUMMARY
Course Elapsed Days: 3
Plan Fractions Treated to Date: 4
Plan Prescribed Dose Per Fraction: 1.8 Gy
Plan Total Fractions Prescribed: 28
Plan Total Prescribed Dose: 50.4 Gy
Reference Point Dosage Given to Date: 7.2 Gy
Reference Point Session Dosage Given: 1.8 Gy
Session Number: 4

## 2023-09-23 ENCOUNTER — Other Ambulatory Visit: Payer: Self-pay

## 2023-09-23 ENCOUNTER — Ambulatory Visit
Admission: RE | Admit: 2023-09-23 | Discharge: 2023-09-23 | Disposition: A | Discharge status: Home / Self Care | Source: Ambulatory Visit | Attending: Radiation Oncology | Admitting: Radiation Oncology

## 2023-09-23 DIAGNOSIS — Z170 Estrogen receptor positive status [ER+]: Secondary | ICD-10-CM | POA: Diagnosis not present

## 2023-09-23 DIAGNOSIS — Z510 Encounter for antineoplastic radiation therapy: Secondary | ICD-10-CM | POA: Diagnosis not present

## 2023-09-23 DIAGNOSIS — C50411 Malignant neoplasm of upper-outer quadrant of right female breast: Secondary | ICD-10-CM | POA: Diagnosis not present

## 2023-09-23 LAB — RAD ONC ARIA SESSION SUMMARY
Course Elapsed Days: 4
Plan Fractions Treated to Date: 5
Plan Prescribed Dose Per Fraction: 1.8 Gy
Plan Total Fractions Prescribed: 28
Plan Total Prescribed Dose: 50.4 Gy
Reference Point Dosage Given to Date: 9 Gy
Reference Point Session Dosage Given: 1.8 Gy
Session Number: 5

## 2023-09-26 ENCOUNTER — Ambulatory Visit
Admission: RE | Admit: 2023-09-26 | Discharge: 2023-09-26 | Disposition: A | Discharge status: Home / Self Care | Source: Ambulatory Visit | Attending: Radiation Oncology | Admitting: Radiation Oncology

## 2023-09-26 ENCOUNTER — Other Ambulatory Visit: Payer: Self-pay

## 2023-09-26 DIAGNOSIS — Z510 Encounter for antineoplastic radiation therapy: Secondary | ICD-10-CM | POA: Diagnosis not present

## 2023-09-26 DIAGNOSIS — C50411 Malignant neoplasm of upper-outer quadrant of right female breast: Secondary | ICD-10-CM | POA: Diagnosis not present

## 2023-09-26 DIAGNOSIS — Z170 Estrogen receptor positive status [ER+]: Secondary | ICD-10-CM | POA: Diagnosis not present

## 2023-09-26 LAB — RAD ONC ARIA SESSION SUMMARY
Course Elapsed Days: 7
Plan Fractions Treated to Date: 6
Plan Prescribed Dose Per Fraction: 1.8 Gy
Plan Total Fractions Prescribed: 28
Plan Total Prescribed Dose: 50.4 Gy
Reference Point Dosage Given to Date: 10.8 Gy
Reference Point Session Dosage Given: 1.8 Gy
Session Number: 6

## 2023-09-27 ENCOUNTER — Ambulatory Visit
Admission: RE | Admit: 2023-09-27 | Discharge: 2023-09-27 | Disposition: A | Discharge status: Home / Self Care | Source: Ambulatory Visit | Attending: Radiation Oncology | Admitting: Radiation Oncology

## 2023-09-27 ENCOUNTER — Other Ambulatory Visit: Payer: Self-pay

## 2023-09-27 DIAGNOSIS — Z510 Encounter for antineoplastic radiation therapy: Secondary | ICD-10-CM | POA: Diagnosis not present

## 2023-09-27 DIAGNOSIS — Z170 Estrogen receptor positive status [ER+]: Secondary | ICD-10-CM | POA: Diagnosis not present

## 2023-09-27 DIAGNOSIS — C50411 Malignant neoplasm of upper-outer quadrant of right female breast: Secondary | ICD-10-CM | POA: Diagnosis not present

## 2023-09-27 LAB — RAD ONC ARIA SESSION SUMMARY
Course Elapsed Days: 8
Plan Fractions Treated to Date: 7
Plan Prescribed Dose Per Fraction: 1.8 Gy
Plan Total Fractions Prescribed: 28
Plan Total Prescribed Dose: 50.4 Gy
Reference Point Dosage Given to Date: 12.6 Gy
Reference Point Session Dosage Given: 1.8 Gy
Session Number: 7

## 2023-09-28 ENCOUNTER — Ambulatory Visit
Admission: RE | Admit: 2023-09-28 | Discharge: 2023-09-28 | Disposition: A | Discharge status: Home / Self Care | Source: Ambulatory Visit | Attending: Radiation Oncology | Admitting: Radiation Oncology

## 2023-09-28 ENCOUNTER — Other Ambulatory Visit: Payer: Self-pay

## 2023-09-28 DIAGNOSIS — C50411 Malignant neoplasm of upper-outer quadrant of right female breast: Secondary | ICD-10-CM | POA: Diagnosis not present

## 2023-09-28 DIAGNOSIS — Z170 Estrogen receptor positive status [ER+]: Secondary | ICD-10-CM | POA: Diagnosis not present

## 2023-09-28 DIAGNOSIS — Z510 Encounter for antineoplastic radiation therapy: Secondary | ICD-10-CM | POA: Diagnosis not present

## 2023-09-28 LAB — RAD ONC ARIA SESSION SUMMARY
Course Elapsed Days: 9
Plan Fractions Treated to Date: 8
Plan Prescribed Dose Per Fraction: 1.8 Gy
Plan Total Fractions Prescribed: 28
Plan Total Prescribed Dose: 50.4 Gy
Reference Point Dosage Given to Date: 14.4 Gy
Reference Point Session Dosage Given: 1.8 Gy
Session Number: 8

## 2023-09-29 ENCOUNTER — Other Ambulatory Visit: Payer: Self-pay

## 2023-09-29 ENCOUNTER — Ambulatory Visit
Admission: RE | Admit: 2023-09-29 | Discharge: 2023-09-29 | Disposition: A | Discharge status: Home / Self Care | Source: Ambulatory Visit | Attending: Radiation Oncology | Admitting: Radiation Oncology

## 2023-09-29 DIAGNOSIS — C50411 Malignant neoplasm of upper-outer quadrant of right female breast: Secondary | ICD-10-CM | POA: Diagnosis not present

## 2023-09-29 DIAGNOSIS — Z170 Estrogen receptor positive status [ER+]: Secondary | ICD-10-CM | POA: Diagnosis not present

## 2023-09-29 DIAGNOSIS — Z510 Encounter for antineoplastic radiation therapy: Secondary | ICD-10-CM | POA: Diagnosis not present

## 2023-09-29 LAB — RAD ONC ARIA SESSION SUMMARY
Course Elapsed Days: 10
Plan Fractions Treated to Date: 9
Plan Prescribed Dose Per Fraction: 1.8 Gy
Plan Total Fractions Prescribed: 28
Plan Total Prescribed Dose: 50.4 Gy
Reference Point Dosage Given to Date: 16.2 Gy
Reference Point Session Dosage Given: 1.8 Gy
Session Number: 9

## 2023-09-30 ENCOUNTER — Ambulatory Visit
Admission: RE | Admit: 2023-09-30 | Discharge: 2023-09-30 | Disposition: A | Discharge status: Home / Self Care | Source: Ambulatory Visit | Attending: Radiation Oncology | Admitting: Radiation Oncology

## 2023-09-30 ENCOUNTER — Other Ambulatory Visit: Payer: Self-pay

## 2023-09-30 DIAGNOSIS — Z170 Estrogen receptor positive status [ER+]: Secondary | ICD-10-CM | POA: Insufficient documentation

## 2023-09-30 DIAGNOSIS — C50411 Malignant neoplasm of upper-outer quadrant of right female breast: Secondary | ICD-10-CM | POA: Diagnosis not present

## 2023-09-30 DIAGNOSIS — Z510 Encounter for antineoplastic radiation therapy: Secondary | ICD-10-CM | POA: Diagnosis not present

## 2023-09-30 LAB — RAD ONC ARIA SESSION SUMMARY
Course Elapsed Days: 11
Plan Fractions Treated to Date: 10
Plan Prescribed Dose Per Fraction: 1.8 Gy
Plan Total Fractions Prescribed: 28
Plan Total Prescribed Dose: 50.4 Gy
Reference Point Dosage Given to Date: 18 Gy
Reference Point Session Dosage Given: 1.8 Gy
Session Number: 10

## 2023-10-03 ENCOUNTER — Other Ambulatory Visit: Payer: Self-pay

## 2023-10-03 ENCOUNTER — Ambulatory Visit
Admission: RE | Admit: 2023-10-03 | Discharge: 2023-10-03 | Disposition: A | Discharge status: Home / Self Care | Source: Ambulatory Visit | Attending: Radiation Oncology | Admitting: Radiation Oncology

## 2023-10-03 DIAGNOSIS — Z170 Estrogen receptor positive status [ER+]: Secondary | ICD-10-CM | POA: Diagnosis not present

## 2023-10-03 DIAGNOSIS — C50411 Malignant neoplasm of upper-outer quadrant of right female breast: Secondary | ICD-10-CM | POA: Diagnosis not present

## 2023-10-03 DIAGNOSIS — Z510 Encounter for antineoplastic radiation therapy: Secondary | ICD-10-CM | POA: Diagnosis not present

## 2023-10-03 LAB — RAD ONC ARIA SESSION SUMMARY
Course Elapsed Days: 14
Plan Fractions Treated to Date: 11
Plan Prescribed Dose Per Fraction: 1.8 Gy
Plan Total Fractions Prescribed: 28
Plan Total Prescribed Dose: 50.4 Gy
Reference Point Dosage Given to Date: 19.8 Gy
Reference Point Session Dosage Given: 1.8 Gy
Session Number: 11

## 2023-10-04 ENCOUNTER — Ambulatory Visit
Admission: RE | Admit: 2023-10-04 | Discharge: 2023-10-04 | Disposition: A | Discharge status: Home / Self Care | Source: Ambulatory Visit | Attending: Radiation Oncology | Admitting: Radiation Oncology

## 2023-10-04 ENCOUNTER — Ambulatory Visit: Admitting: Radiation Oncology

## 2023-10-04 ENCOUNTER — Other Ambulatory Visit: Payer: Self-pay

## 2023-10-04 ENCOUNTER — Ambulatory Visit
Admission: RE | Admit: 2023-10-04 | Discharge: 2023-10-04 | Discharge status: Home / Self Care | Source: Ambulatory Visit | Attending: Radiation Oncology | Admitting: Radiation Oncology

## 2023-10-04 DIAGNOSIS — Z510 Encounter for antineoplastic radiation therapy: Secondary | ICD-10-CM | POA: Diagnosis not present

## 2023-10-04 DIAGNOSIS — C50411 Malignant neoplasm of upper-outer quadrant of right female breast: Secondary | ICD-10-CM | POA: Diagnosis not present

## 2023-10-04 DIAGNOSIS — Z170 Estrogen receptor positive status [ER+]: Secondary | ICD-10-CM | POA: Diagnosis not present

## 2023-10-04 LAB — RAD ONC ARIA SESSION SUMMARY
Course Elapsed Days: 15
Plan Fractions Treated to Date: 12
Plan Prescribed Dose Per Fraction: 1.8 Gy
Plan Total Fractions Prescribed: 28
Plan Total Prescribed Dose: 50.4 Gy
Reference Point Dosage Given to Date: 21.6 Gy
Reference Point Session Dosage Given: 1.8 Gy
Session Number: 12

## 2023-10-05 ENCOUNTER — Ambulatory Visit
Admission: RE | Admit: 2023-10-05 | Discharge: 2023-10-05 | Disposition: A | Discharge status: Home / Self Care | Source: Ambulatory Visit | Attending: Radiation Oncology | Admitting: Radiation Oncology

## 2023-10-05 ENCOUNTER — Other Ambulatory Visit: Payer: Self-pay

## 2023-10-05 DIAGNOSIS — Z170 Estrogen receptor positive status [ER+]: Secondary | ICD-10-CM | POA: Diagnosis not present

## 2023-10-05 DIAGNOSIS — C50411 Malignant neoplasm of upper-outer quadrant of right female breast: Secondary | ICD-10-CM | POA: Diagnosis not present

## 2023-10-05 DIAGNOSIS — Z510 Encounter for antineoplastic radiation therapy: Secondary | ICD-10-CM | POA: Diagnosis not present

## 2023-10-05 LAB — RAD ONC ARIA SESSION SUMMARY
Course Elapsed Days: 16
Plan Fractions Treated to Date: 13
Plan Prescribed Dose Per Fraction: 1.8 Gy
Plan Total Fractions Prescribed: 28
Plan Total Prescribed Dose: 50.4 Gy
Reference Point Dosage Given to Date: 23.4 Gy
Reference Point Session Dosage Given: 1.8 Gy
Session Number: 13

## 2023-10-06 ENCOUNTER — Other Ambulatory Visit: Payer: Self-pay

## 2023-10-06 ENCOUNTER — Ambulatory Visit
Admission: RE | Admit: 2023-10-06 | Discharge: 2023-10-06 | Disposition: A | Discharge status: Home / Self Care | Source: Ambulatory Visit | Attending: Radiation Oncology | Admitting: Radiation Oncology

## 2023-10-06 DIAGNOSIS — H10823 Rosacea conjunctivitis, bilateral: Secondary | ICD-10-CM | POA: Diagnosis not present

## 2023-10-06 DIAGNOSIS — B880 Other acariasis: Secondary | ICD-10-CM | POA: Diagnosis not present

## 2023-10-06 DIAGNOSIS — H0102B Squamous blepharitis left eye, upper and lower eyelids: Secondary | ICD-10-CM | POA: Diagnosis not present

## 2023-10-06 DIAGNOSIS — H04123 Dry eye syndrome of bilateral lacrimal glands: Secondary | ICD-10-CM | POA: Diagnosis not present

## 2023-10-06 DIAGNOSIS — C50411 Malignant neoplasm of upper-outer quadrant of right female breast: Secondary | ICD-10-CM | POA: Diagnosis not present

## 2023-10-06 DIAGNOSIS — Z170 Estrogen receptor positive status [ER+]: Secondary | ICD-10-CM | POA: Diagnosis not present

## 2023-10-06 DIAGNOSIS — Z510 Encounter for antineoplastic radiation therapy: Secondary | ICD-10-CM | POA: Diagnosis not present

## 2023-10-06 LAB — RAD ONC ARIA SESSION SUMMARY
Course Elapsed Days: 17
Plan Fractions Treated to Date: 14
Plan Prescribed Dose Per Fraction: 1.8 Gy
Plan Total Fractions Prescribed: 28
Plan Total Prescribed Dose: 50.4 Gy
Reference Point Dosage Given to Date: 25.2 Gy
Reference Point Session Dosage Given: 1.8 Gy
Session Number: 14

## 2023-10-07 ENCOUNTER — Other Ambulatory Visit: Payer: Self-pay

## 2023-10-07 ENCOUNTER — Ambulatory Visit
Admission: RE | Admit: 2023-10-07 | Discharge: 2023-10-07 | Disposition: A | Discharge status: Home / Self Care | Source: Ambulatory Visit | Attending: Radiation Oncology | Admitting: Radiation Oncology

## 2023-10-07 DIAGNOSIS — C50411 Malignant neoplasm of upper-outer quadrant of right female breast: Secondary | ICD-10-CM | POA: Diagnosis not present

## 2023-10-07 DIAGNOSIS — Z510 Encounter for antineoplastic radiation therapy: Secondary | ICD-10-CM | POA: Diagnosis not present

## 2023-10-07 DIAGNOSIS — Z170 Estrogen receptor positive status [ER+]: Secondary | ICD-10-CM | POA: Diagnosis not present

## 2023-10-07 LAB — RAD ONC ARIA SESSION SUMMARY
Course Elapsed Days: 18
Plan Fractions Treated to Date: 15
Plan Prescribed Dose Per Fraction: 1.8 Gy
Plan Total Fractions Prescribed: 28
Plan Total Prescribed Dose: 50.4 Gy
Reference Point Dosage Given to Date: 27 Gy
Reference Point Session Dosage Given: 1.8 Gy
Session Number: 15

## 2023-10-10 ENCOUNTER — Other Ambulatory Visit: Payer: Self-pay

## 2023-10-10 ENCOUNTER — Ambulatory Visit

## 2023-10-10 ENCOUNTER — Ambulatory Visit
Admission: RE | Admit: 2023-10-10 | Discharge: 2023-10-10 | Disposition: A | Discharge status: Home / Self Care | Source: Ambulatory Visit | Attending: Radiation Oncology | Admitting: Radiation Oncology

## 2023-10-10 DIAGNOSIS — Z510 Encounter for antineoplastic radiation therapy: Secondary | ICD-10-CM | POA: Diagnosis not present

## 2023-10-10 DIAGNOSIS — Z170 Estrogen receptor positive status [ER+]: Secondary | ICD-10-CM | POA: Diagnosis not present

## 2023-10-10 DIAGNOSIS — C50411 Malignant neoplasm of upper-outer quadrant of right female breast: Secondary | ICD-10-CM | POA: Diagnosis not present

## 2023-10-10 LAB — RAD ONC ARIA SESSION SUMMARY
Course Elapsed Days: 21
Plan Fractions Treated to Date: 16
Plan Prescribed Dose Per Fraction: 1.8 Gy
Plan Total Fractions Prescribed: 28
Plan Total Prescribed Dose: 50.4 Gy
Reference Point Dosage Given to Date: 28.8 Gy
Reference Point Session Dosage Given: 1.8 Gy
Session Number: 16

## 2023-10-11 ENCOUNTER — Other Ambulatory Visit: Payer: Self-pay

## 2023-10-11 ENCOUNTER — Ambulatory Visit
Admission: RE | Admit: 2023-10-11 | Discharge: 2023-10-11 | Disposition: A | Discharge status: Home / Self Care | Source: Ambulatory Visit | Attending: Radiation Oncology | Admitting: Radiation Oncology

## 2023-10-11 DIAGNOSIS — Z510 Encounter for antineoplastic radiation therapy: Secondary | ICD-10-CM | POA: Diagnosis not present

## 2023-10-11 DIAGNOSIS — Z170 Estrogen receptor positive status [ER+]: Secondary | ICD-10-CM | POA: Diagnosis not present

## 2023-10-11 DIAGNOSIS — C50411 Malignant neoplasm of upper-outer quadrant of right female breast: Secondary | ICD-10-CM | POA: Diagnosis not present

## 2023-10-11 LAB — RAD ONC ARIA SESSION SUMMARY
Course Elapsed Days: 22
Plan Fractions Treated to Date: 17
Plan Prescribed Dose Per Fraction: 1.8 Gy
Plan Total Fractions Prescribed: 28
Plan Total Prescribed Dose: 50.4 Gy
Reference Point Dosage Given to Date: 30.6 Gy
Reference Point Session Dosage Given: 1.8 Gy
Session Number: 17

## 2023-10-12 ENCOUNTER — Other Ambulatory Visit: Payer: Self-pay

## 2023-10-12 ENCOUNTER — Ambulatory Visit
Admission: RE | Admit: 2023-10-12 | Discharge: 2023-10-12 | Disposition: A | Discharge status: Home / Self Care | Source: Ambulatory Visit | Attending: Radiation Oncology | Admitting: Radiation Oncology

## 2023-10-12 DIAGNOSIS — Z170 Estrogen receptor positive status [ER+]: Secondary | ICD-10-CM | POA: Diagnosis not present

## 2023-10-12 DIAGNOSIS — Z510 Encounter for antineoplastic radiation therapy: Secondary | ICD-10-CM | POA: Diagnosis not present

## 2023-10-12 DIAGNOSIS — C50411 Malignant neoplasm of upper-outer quadrant of right female breast: Secondary | ICD-10-CM | POA: Diagnosis not present

## 2023-10-12 LAB — RAD ONC ARIA SESSION SUMMARY
Course Elapsed Days: 23
Plan Fractions Treated to Date: 18
Plan Prescribed Dose Per Fraction: 1.8 Gy
Plan Total Fractions Prescribed: 28
Plan Total Prescribed Dose: 50.4 Gy
Reference Point Dosage Given to Date: 32.4 Gy
Reference Point Session Dosage Given: 1.8 Gy
Session Number: 18

## 2023-10-13 ENCOUNTER — Ambulatory Visit
Admission: RE | Admit: 2023-10-13 | Discharge: 2023-10-13 | Disposition: A | Discharge status: Home / Self Care | Source: Ambulatory Visit | Attending: Radiation Oncology | Admitting: Radiation Oncology

## 2023-10-13 ENCOUNTER — Other Ambulatory Visit: Payer: Self-pay

## 2023-10-13 DIAGNOSIS — C50411 Malignant neoplasm of upper-outer quadrant of right female breast: Secondary | ICD-10-CM | POA: Diagnosis not present

## 2023-10-13 DIAGNOSIS — Z510 Encounter for antineoplastic radiation therapy: Secondary | ICD-10-CM | POA: Diagnosis not present

## 2023-10-13 DIAGNOSIS — Z170 Estrogen receptor positive status [ER+]: Secondary | ICD-10-CM | POA: Diagnosis not present

## 2023-10-13 LAB — RAD ONC ARIA SESSION SUMMARY
Course Elapsed Days: 24
Plan Fractions Treated to Date: 19
Plan Prescribed Dose Per Fraction: 1.8 Gy
Plan Total Fractions Prescribed: 28
Plan Total Prescribed Dose: 50.4 Gy
Reference Point Dosage Given to Date: 34.2 Gy
Reference Point Session Dosage Given: 1.8 Gy
Session Number: 19

## 2023-10-14 ENCOUNTER — Other Ambulatory Visit: Payer: Self-pay

## 2023-10-14 ENCOUNTER — Ambulatory Visit

## 2023-10-14 ENCOUNTER — Ambulatory Visit
Admission: RE | Admit: 2023-10-14 | Discharge: 2023-10-14 | Disposition: A | Discharge status: Home / Self Care | Source: Ambulatory Visit | Attending: Radiation Oncology | Admitting: Radiation Oncology

## 2023-10-14 DIAGNOSIS — Z510 Encounter for antineoplastic radiation therapy: Secondary | ICD-10-CM | POA: Diagnosis not present

## 2023-10-14 DIAGNOSIS — C50411 Malignant neoplasm of upper-outer quadrant of right female breast: Secondary | ICD-10-CM | POA: Diagnosis not present

## 2023-10-14 DIAGNOSIS — Z170 Estrogen receptor positive status [ER+]: Secondary | ICD-10-CM | POA: Diagnosis not present

## 2023-10-14 LAB — RAD ONC ARIA SESSION SUMMARY
Course Elapsed Days: 25
Plan Fractions Treated to Date: 20
Plan Prescribed Dose Per Fraction: 1.8 Gy
Plan Total Fractions Prescribed: 28
Plan Total Prescribed Dose: 50.4 Gy
Reference Point Dosage Given to Date: 36 Gy
Reference Point Session Dosage Given: 1.8 Gy
Session Number: 20

## 2023-10-17 ENCOUNTER — Other Ambulatory Visit: Payer: Self-pay

## 2023-10-17 ENCOUNTER — Ambulatory Visit
Admission: RE | Admit: 2023-10-17 | Discharge: 2023-10-17 | Disposition: A | Discharge status: Home / Self Care | Source: Ambulatory Visit | Attending: Radiation Oncology | Admitting: Radiation Oncology

## 2023-10-17 ENCOUNTER — Ambulatory Visit

## 2023-10-17 DIAGNOSIS — C50411 Malignant neoplasm of upper-outer quadrant of right female breast: Secondary | ICD-10-CM | POA: Diagnosis not present

## 2023-10-17 LAB — RAD ONC ARIA SESSION SUMMARY
Course Elapsed Days: 28
Plan Fractions Treated to Date: 21
Plan Prescribed Dose Per Fraction: 1.8 Gy
Plan Total Fractions Prescribed: 28
Plan Total Prescribed Dose: 50.4 Gy
Reference Point Dosage Given to Date: 37.8 Gy
Reference Point Session Dosage Given: 1.8 Gy
Session Number: 21

## 2023-10-18 ENCOUNTER — Ambulatory Visit
Admission: RE | Admit: 2023-10-18 | Discharge: 2023-10-18 | Disposition: A | Discharge status: Home / Self Care | Source: Ambulatory Visit | Attending: Radiation Oncology | Admitting: Radiation Oncology

## 2023-10-18 ENCOUNTER — Other Ambulatory Visit: Payer: Self-pay

## 2023-10-18 ENCOUNTER — Ambulatory Visit
Admission: RE | Admit: 2023-10-18 | Discharge: 2023-10-18 | Discharge status: Home / Self Care | Source: Ambulatory Visit | Attending: Radiation Oncology | Admitting: Radiation Oncology

## 2023-10-18 ENCOUNTER — Ambulatory Visit: Admitting: Radiation Oncology

## 2023-10-18 DIAGNOSIS — C50411 Malignant neoplasm of upper-outer quadrant of right female breast: Secondary | ICD-10-CM | POA: Diagnosis not present

## 2023-10-18 DIAGNOSIS — Z510 Encounter for antineoplastic radiation therapy: Secondary | ICD-10-CM | POA: Diagnosis not present

## 2023-10-18 DIAGNOSIS — Z170 Estrogen receptor positive status [ER+]: Secondary | ICD-10-CM

## 2023-10-18 LAB — RAD ONC ARIA SESSION SUMMARY
Course Elapsed Days: 29
Plan Fractions Treated to Date: 22
Plan Prescribed Dose Per Fraction: 1.8 Gy
Plan Total Fractions Prescribed: 28
Plan Total Prescribed Dose: 50.4 Gy
Reference Point Dosage Given to Date: 39.6 Gy
Reference Point Session Dosage Given: 1.8 Gy
Session Number: 22

## 2023-10-18 MED ORDER — RADIAPLEXRX EX GEL: Freq: Once | CUTANEOUS | Status: AC

## 2023-10-19 ENCOUNTER — Ambulatory Visit
Admission: RE | Admit: 2023-10-19 | Discharge: 2023-10-19 | Disposition: A | Discharge status: Home / Self Care | Source: Ambulatory Visit | Attending: Radiation Oncology | Admitting: Radiation Oncology

## 2023-10-19 ENCOUNTER — Other Ambulatory Visit: Payer: Self-pay

## 2023-10-19 DIAGNOSIS — Z510 Encounter for antineoplastic radiation therapy: Secondary | ICD-10-CM | POA: Diagnosis not present

## 2023-10-19 DIAGNOSIS — Z170 Estrogen receptor positive status [ER+]: Secondary | ICD-10-CM | POA: Diagnosis not present

## 2023-10-19 DIAGNOSIS — C50411 Malignant neoplasm of upper-outer quadrant of right female breast: Secondary | ICD-10-CM | POA: Diagnosis not present

## 2023-10-19 LAB — RAD ONC ARIA SESSION SUMMARY
Course Elapsed Days: 30
Plan Fractions Treated to Date: 23
Plan Prescribed Dose Per Fraction: 1.8 Gy
Plan Total Fractions Prescribed: 28
Plan Total Prescribed Dose: 50.4 Gy
Reference Point Dosage Given to Date: 41.4 Gy
Reference Point Session Dosage Given: 1.8 Gy
Session Number: 23

## 2023-10-20 ENCOUNTER — Ambulatory Visit
Admission: RE | Admit: 2023-10-20 | Discharge: 2023-10-20 | Disposition: A | Discharge status: Home / Self Care | Source: Ambulatory Visit | Attending: Radiation Oncology | Admitting: Radiation Oncology

## 2023-10-20 ENCOUNTER — Other Ambulatory Visit: Payer: Self-pay

## 2023-10-20 DIAGNOSIS — G4700 Insomnia, unspecified: Secondary | ICD-10-CM | POA: Diagnosis not present

## 2023-10-20 DIAGNOSIS — J449 Chronic obstructive pulmonary disease, unspecified: Secondary | ICD-10-CM | POA: Diagnosis not present

## 2023-10-20 DIAGNOSIS — I10 Essential (primary) hypertension: Secondary | ICD-10-CM | POA: Diagnosis not present

## 2023-10-20 DIAGNOSIS — C50411 Malignant neoplasm of upper-outer quadrant of right female breast: Secondary | ICD-10-CM | POA: Diagnosis not present

## 2023-10-20 DIAGNOSIS — Z510 Encounter for antineoplastic radiation therapy: Secondary | ICD-10-CM | POA: Diagnosis not present

## 2023-10-20 DIAGNOSIS — Z170 Estrogen receptor positive status [ER+]: Secondary | ICD-10-CM | POA: Diagnosis not present

## 2023-10-20 DIAGNOSIS — R7303 Prediabetes: Secondary | ICD-10-CM | POA: Diagnosis not present

## 2023-10-20 DIAGNOSIS — F17201 Nicotine dependence, unspecified, in remission: Secondary | ICD-10-CM | POA: Diagnosis not present

## 2023-10-20 DIAGNOSIS — F331 Major depressive disorder, recurrent, moderate: Secondary | ICD-10-CM | POA: Diagnosis not present

## 2023-10-20 DIAGNOSIS — I700 Atherosclerosis of aorta: Secondary | ICD-10-CM | POA: Diagnosis not present

## 2023-10-20 LAB — RAD ONC ARIA SESSION SUMMARY
Course Elapsed Days: 31
Plan Fractions Treated to Date: 24
Plan Prescribed Dose Per Fraction: 1.8 Gy
Plan Total Fractions Prescribed: 28
Plan Total Prescribed Dose: 50.4 Gy
Reference Point Dosage Given to Date: 43.2 Gy
Reference Point Session Dosage Given: 1.8 Gy
Session Number: 24

## 2023-10-21 ENCOUNTER — Ambulatory Visit

## 2023-10-21 ENCOUNTER — Other Ambulatory Visit: Payer: Self-pay

## 2023-10-21 ENCOUNTER — Ambulatory Visit
Admission: RE | Admit: 2023-10-21 | Discharge: 2023-10-21 | Disposition: A | Discharge status: Home / Self Care | Source: Ambulatory Visit | Attending: Radiation Oncology | Admitting: Radiation Oncology

## 2023-10-21 DIAGNOSIS — Z510 Encounter for antineoplastic radiation therapy: Secondary | ICD-10-CM | POA: Diagnosis not present

## 2023-10-21 DIAGNOSIS — Z170 Estrogen receptor positive status [ER+]: Secondary | ICD-10-CM | POA: Diagnosis not present

## 2023-10-21 DIAGNOSIS — C50411 Malignant neoplasm of upper-outer quadrant of right female breast: Secondary | ICD-10-CM | POA: Diagnosis not present

## 2023-10-21 LAB — RAD ONC ARIA SESSION SUMMARY
Course Elapsed Days: 32
Plan Fractions Treated to Date: 25
Plan Prescribed Dose Per Fraction: 1.8 Gy
Plan Total Fractions Prescribed: 28
Plan Total Prescribed Dose: 50.4 Gy
Reference Point Dosage Given to Date: 45 Gy
Reference Point Session Dosage Given: 1.8 Gy
Session Number: 25

## 2023-10-24 ENCOUNTER — Telehealth: Payer: Self-pay

## 2023-10-24 ENCOUNTER — Other Ambulatory Visit: Payer: Self-pay

## 2023-10-24 ENCOUNTER — Ambulatory Visit
Admission: RE | Admit: 2023-10-24 | Discharge: 2023-10-24 | Disposition: A | Discharge status: Home / Self Care | Source: Ambulatory Visit | Attending: Radiation Oncology | Admitting: Radiation Oncology

## 2023-10-24 DIAGNOSIS — C50411 Malignant neoplasm of upper-outer quadrant of right female breast: Secondary | ICD-10-CM | POA: Diagnosis not present

## 2023-10-24 DIAGNOSIS — Z170 Estrogen receptor positive status [ER+]: Secondary | ICD-10-CM | POA: Diagnosis not present

## 2023-10-24 DIAGNOSIS — Z510 Encounter for antineoplastic radiation therapy: Secondary | ICD-10-CM | POA: Diagnosis not present

## 2023-10-24 LAB — RAD ONC ARIA SESSION SUMMARY
Course Elapsed Days: 35
Plan Fractions Treated to Date: 26
Plan Prescribed Dose Per Fraction: 1.8 Gy
Plan Total Fractions Prescribed: 28
Plan Total Prescribed Dose: 50.4 Gy
Reference Point Dosage Given to Date: 46.8 Gy
Reference Point Session Dosage Given: 1.8 Gy
Session Number: 26

## 2023-10-24 NOTE — Telephone Encounter (Signed)
 Spoke with patient and confirmed appointment on 8/26.SABRA

## 2023-10-25 ENCOUNTER — Other Ambulatory Visit: Payer: Self-pay

## 2023-10-25 ENCOUNTER — Inpatient Hospital Stay (HOSPITAL_BASED_OUTPATIENT_CLINIC_OR_DEPARTMENT_OTHER): Admitting: Hematology and Oncology

## 2023-10-25 ENCOUNTER — Ambulatory Visit
Admission: RE | Admit: 2023-10-25 | Discharge: 2023-10-25 | Disposition: A | Discharge status: Home / Self Care | Source: Ambulatory Visit | Attending: Radiation Oncology | Admitting: Radiation Oncology

## 2023-10-25 VITALS — BP 147/90 | HR 70 | Temp 98.3°F | Resp 19 | Wt 290.5 lb

## 2023-10-25 DIAGNOSIS — C50411 Malignant neoplasm of upper-outer quadrant of right female breast: Secondary | ICD-10-CM | POA: Diagnosis not present

## 2023-10-25 DIAGNOSIS — Z79899 Other long term (current) drug therapy: Secondary | ICD-10-CM | POA: Insufficient documentation

## 2023-10-25 DIAGNOSIS — Z170 Estrogen receptor positive status [ER+]: Secondary | ICD-10-CM | POA: Insufficient documentation

## 2023-10-25 DIAGNOSIS — M1990 Unspecified osteoarthritis, unspecified site: Secondary | ICD-10-CM | POA: Insufficient documentation

## 2023-10-25 DIAGNOSIS — Z13820 Encounter for screening for osteoporosis: Secondary | ICD-10-CM

## 2023-10-25 DIAGNOSIS — Z510 Encounter for antineoplastic radiation therapy: Secondary | ICD-10-CM | POA: Diagnosis not present

## 2023-10-25 DIAGNOSIS — Z923 Personal history of irradiation: Secondary | ICD-10-CM | POA: Insufficient documentation

## 2023-10-25 DIAGNOSIS — Z780 Asymptomatic menopausal state: Secondary | ICD-10-CM

## 2023-10-25 LAB — RAD ONC ARIA SESSION SUMMARY
Course Elapsed Days: 36
Plan Fractions Treated to Date: 27
Plan Prescribed Dose Per Fraction: 1.8 Gy
Plan Total Fractions Prescribed: 28
Plan Total Prescribed Dose: 50.4 Gy
Reference Point Dosage Given to Date: 48.6 Gy
Reference Point Session Dosage Given: 1.8 Gy
Session Number: 27

## 2023-10-25 MED ORDER — ANASTROZOLE 1 MG PO TABS: 1.0000 mg | ORAL_TABLET | Freq: Every day | ORAL | 0 refills | Status: DC

## 2023-10-25 NOTE — Progress Notes (Signed)
 [facility] CONSULT NOTE  Patient Care Team: Tammy Channel, MD as PCP - General (Family Medicine) Tammy Nanetta SAILOR, RN as Oncology Nurse Navigator Glean, Stephane BROCKS, RN (Inactive) as Oncology Nurse Navigator Tammy Ned, MD as Consulting Physician (General Surgery) Tammy Ash, MD as Consulting Physician (Hematology and Oncology) Tammy Agent, MD as Consulting Physician (Radiation Oncology)  CHIEF COMPLAINTS/PURPOSE OF CONSULTATION:  Newly diagnosed breast cancer  HISTORY OF PRESENTING ILLNESS:  Tammy Mccoy 73 y.o. female is here because of recent diagnosis of right breast cancer  I reviewed her records extensively and collaborated the history with the patient.  SUMMARY OF ONCOLOGIC HISTORY: Oncology History  Malignant neoplasm of upper-outer quadrant of right breast in female, estrogen receptor positive (HCC)  05/24/2017 Genetic Testing   No pathogenic variants were detected. Of note, a VUS was identified in the MET gene (c.1030G>A (p.Gly344Arg)) on the multicancer panel. Report date is 05/25/2017.  Update: The MET VUS (c.1030G>A) has been reclassified to likely benign. Report date is 07/13/2021.  The Multi-Gene Panel offered by [facility] includes sequencing and/or deletion duplication testing of the following 83 genes: ALK, APC, ATM, AXIN2,BAP1,  BARD1, BLM, BMPR1A, BRCA1, BRCA2, BRIP1, CASR, CDC73, CDH1, CDK4, CDKN1B, CDKN1C, CDKN2A (p14ARF), CDKN2A (p16INK4a), CEBPA, CHEK2, CTNNA1, DICER1, DIS3L2, EGFR (c.2369C>T, p.Thr790Met variant only), EPCAM (Deletion/duplication testing only), FH, FLCN, GATA2, GPC3, GREM1 (Promoter region deletion/duplication testing only), HOXB13 (c.251G>A, p.Gly84Glu), HRAS, KIT, MAX, MEN1, MET, MITF (c.952G>A, p.Glu318Lys variant only), MLH1, MSH2, MSH3, MSH6, MUTYH, NBN, NF1, NF2, NTHL1, PALB2, PDGFRA, PHOX2B, PMS2, POLD1, POLE, POT1, PRKAR1A, PTCH1, PTEN, RAD50, RAD51C, RAD51D, RB1, RECQL4, RET, RUNX1, SDHAF2, SDHA (sequence changes  only), SDHB, SDHC, SDHD, SMAD4, SMARCA4, SMARCB1, SMARCE1, STK11, SUFU, TERT, TERT, TMEM127, TP53, TSC1, TSC2, VHL, WRN and WT1.  The report date is May 24, 2017.   06/27/2023 Initial Diagnosis   Malignant neoplasm of upper-outer quadrant of right breast in female, estrogen receptor positive (HCC)   06/29/2023 Cancer Staging   Staging form: Breast, AJCC 8th Edition - Clinical stage from 06/29/2023: Stage IA (cT1b, cN0, cM0, G2, ER+, PR+, HER2-) - Signed by Tammy Ash, MD on 06/29/2023 Stage prefix: Initial diagnosis Histologic grading system: 3 grade system     Discussed the use of AI scribe software for clinical note transcription with the patient, who gave verbal consent to proceed.  History of Present Illness  Tammy Mccoy is a 73 year old female undergoing radiation therapy for breast cancer who presents with fatigue and weight gain.  She is currently undergoing radiation therapy for breast cancer and is nearing the end of her treatment, with the boost phase starting soon. She reports experiencing fatigue and notes weight gain due to decreased physical activity during this period.  She has sensitive skin and is concerned about potential skin reactions from the radiation therapy. Her skin tends to break out easily, and she wants to resume water exercises once her skin heals from the radiation therapy to help manage her weight.  She has a history of thyroid  changes but states that her thyroid  levels are currently normal. She experiences issues with feeling hot all the time.  She has not had a bone density scan in a long time but recalls that previous scans were normal.  MEDICAL HISTORY:  Past Medical History:  Diagnosis Date   Alcohol abuse    pt stopped drinking alcohol around 2009   Anxiety    Arthritis    bil knees and neck   Asthma    excersice  B12 deficiency    Back pain    Breast cancer (HCC) 06/2023   right breast IDC   Claustrophobia     Claustrophobia    Colon polyps    Constipation    COPD (chronic obstructive pulmonary disease) (HCC)    Depression    Diabetes mellitus without complication (HCC)    Difficult intubation 06/11/2016   glidescope grade 2, pt states there was no difficulty with airway during cholecystectomy   Diverticulitis    Family history of adverse reaction to anesthesia    sister has vomiting   Family history of bladder cancer    Family history of breast cancer    Family history of colon cancer    Family history of thyroid  cancer    Former smoker    H/O rosacea    Hyperlipidemia    Hyperparathyroidism (HCC)    Hypertension    Joint pain    Multiple thyroid  nodules    Multiple thyroid  nodules    benign has checked yearly   Multiple thyroid  nodules    benign per pt   Palpitations    Skin cancer, basal cell    Sleep apnea    no cpap   SOB (shortness of breath)    Squamous cell skin cancer    Swelling of both lower extremities    Thyroid  disease    Vitamin D deficiency     SURGICAL HISTORY: Past Surgical History:  Procedure Laterality Date   BREAST LUMPECTOMY WITH RADIOACTIVE SEED LOCALIZATION Right 08/02/2023   Procedure: BREAST LUMPECTOMY WITH RADIOACTIVE SEED LOCALIZATION;  Surgeon: Tammy Ned, MD;  Location: [facility];  Service: General;  Laterality: Right;  RIGHT BREAST SEED LUMPECTOMY   CARPAL TUNNEL RELEASE     Bilateral   CESAREAN SECTION     x 2   CHOLECYSTECTOMY     ENDOVENOUS ABLATION SAPHENOUS VEIN W/ LASER Left 11/16/2016   endovenous laser ablation L GSV and stab phlebectomy > 20 incisions left leg by Lynwood Collum MD    PARATHYROIDECTOMY Left 06/11/2016   Procedure: LEFT PARATHYROIDECTOMY;  Surgeon: Krystal Spinner, MD;  Location: [facility];  Service: General;  Laterality: Left;   TONSILLECTOMY AND ADENOIDECTOMY     TUBAL LIGATION     VULVECTOMY N/A 02/16/2023   Procedure: LYNDLE;  Surgeon: Rosalva Sawyer, MD;  Location: [facility] OR;  Service: Gynecology;   Laterality: N/A;    SOCIAL HISTORY: Social History   Socioeconomic History   Marital status: Widowed    Spouse name: Not on file   Number of children: 2   Years of education: Not on file   Highest education level: Not on file  Occupational History   Occupation: retired  Tobacco Use   Smoking status: Former    Current packs/day: 0.00    Average packs/day: 1 pack/day for 38.0 years (38.0 ttl pk-yrs)    Types: Cigarettes    Start date: 03/01/1970    Quit date: 03/01/2008    Years since quitting: 15.6   Smokeless tobacco: Never  Vaping Use   Vaping status: Never Used  Substance and Sexual Activity   Alcohol use: No    Comment: pt quit drinking around 2009   Drug use: No   Sexual activity: Not Currently    Birth control/protection: Post-menopausal, Surgical    Comment: BTL  Other Topics Concern   Not on file  Social History Narrative   Used to drink alcohol.  None in the last 10 years.   Son lives  with her.    She has 3 grands.  She helps take care of them.    Social Drivers of Corporate investment banker Strain: Not on file  Food Insecurity: No Food Insecurity (09/12/2023)   Hunger Vital Sign    Worried About Running Out of Food in the Last Year: Never true    Ran Out of Food in the Last Year: Never true  Transportation Needs: No Transportation Needs (06/29/2023)   PRAPARE - Administrator, Civil Service (Medical): No    Lack of Transportation (Non-Medical): No  Physical Activity: Not on file  Stress: Not on file  Social Connections: Not on file  Intimate Partner Violence: Not At Risk (09/12/2023)   Humiliation, Afraid, Rape, and Kick questionnaire    Fear of Current or Ex-Partner: No    Emotionally Abused: No    Physically Abused: No    Sexually Abused: No    FAMILY HISTORY: Family History  Problem Relation Age of Onset   CVA Mother 57   Thyroid  cancer Mother        dx in her 105s   Colon cancer Mother        dx in her 29s, d. 22   Lung cancer Mother         dx in 70s   Cancer Mother        parotid gland cancer, d. 5   Stroke Mother    Depression Mother    Anxiety disorder Mother    Alcoholism Mother    Cancer Father        Bladder   Depression Father    Alcoholism Father    Cancer Sister        breast, thyroid  and colon   Hyperparathyroidism Sister    Thyroid  cancer Sister        dx in 15s, 2nd time at 16   Breast cancer Sister 30       second at 31   Diabetes Maternal Grandmother    COPD Maternal Grandfather    Breast cancer Paternal Grandmother        dx in 36s; d. 74   Thyroid  cancer Other        dx late 62s; daughter of sister with thyroid  cancer    ALLERGIES:  is allergic to myrbetriq [mirabegron], amoxicillin, augmentin [amoxicillin-pot clavulanate], bupropion, clindamycin /lincomycin, doxycycline, duloxetine, ibuprofen, oxybutynin chloride, ozempic (0.25 or 0.5 mg-dose) [semaglutide(0.25 or 0.5mg -dos)], rosuvastatin, and trintellix [vortioxetine].  MEDICATIONS:  Current Outpatient Medications  Medication Sig Dispense Refill   acetaminophen  (TYLENOL ) 500 MG tablet Take 2 tablets (1,000 mg total) by mouth every 8 (eight) hours as needed for mild pain (pain score 1-3). 30 tablet 0   acetaminophen  (TYLENOL ) 650 MG CR tablet Take 1,300 mg by mouth every 8 (eight) hours as needed for pain.     ALPRAZolam (XANAX) 0.5 MG tablet Take 0.25-0.5 mg by mouth at bedtime as needed for sleep.      Cholecalciferol (VITAMIN D3) 2000 units capsule Take 2,000 Units by mouth daily.     Cyanocobalamin (B-12 PO) Take 1,000 mcg by mouth daily.     doxycycline (MONODOX) 50 MG capsule Take 50 mg by mouth as directed. Once daily     FLUoxetine (PROZAC) 10 MG capsule Take 30 mg by mouth daily.     fluticasone (FLONASE) 50 MCG/ACT nasal spray Place 1 spray into both nostrils daily as needed for allergies or rhinitis.     ketoconazole (NIZORAL) 2 %  cream Apply 1 Application topically daily as needed (itching).     metFORMIN (GLUCOPHAGE-XR) 500  MG 24 hr tablet Take 500 mg by mouth at bedtime.     Multiple Vitamin (MULTIVITAMIN WITH MINERALS) TABS tablet Take 1 tablet by mouth daily.     naproxen  sodium (ALEVE ) 220 MG tablet Take 220 mg by mouth.     pravastatin (PRAVACHOL) 10 MG tablet Take 10 mg by mouth 2 (two) times a week.     triamterene-hydrochlorothiazide  (MAXZIDE-25) 37.5-25 MG tablet Take 0.5 tablets by mouth every morning.     No current facility-administered medications for this visit.    REVIEW OF SYSTEMS:   Constitutional: Denies fevers, chills or abnormal night sweats Eyes: Denies blurriness of vision, double vision or watery eyes Ears, nose, mouth, throat, and face: Denies mucositis or sore throat Respiratory: Denies cough, dyspnea or wheezes Cardiovascular: Denies palpitation, chest discomfort or lower extremity swelling Gastrointestinal:  Denies nausea, heartburn or change in bowel habits Skin: Denies abnormal skin rashes Lymphatics: Denies new lymphadenopathy or easy bruising Neurological:Denies numbness, tingling or new weaknesses Behavioral/Psych: Mood is stable, no new changes  Breast: Denies any palpable lumps or discharge All other systems were reviewed with the patient and are negative.  PHYSICAL EXAMINATION: ECOG PERFORMANCE STATUS: 0 - Asymptomatic  Vitals:   10/25/23 1142  BP: (!) 147/90  Pulse: 70  Resp: 19  Temp: 98.3 F (36.8 C)  SpO2: 98%   Filed Weights   10/25/23 1142  Weight: 290 lb 8 oz (131.8 kg)    GENERAL:alert, no distress and comfortable   LABORATORY DATA:  I have reviewed the data as listed Lab Results  Component Value Date   WBC 9.3 06/29/2023   HGB 14.3 06/29/2023   HCT 43.5 06/29/2023   MCV 89.7 06/29/2023   PLT 313 06/29/2023   Lab Results  Component Value Date   NA 134 (L) 07/28/2023   K 4.5 07/28/2023   CL 99 07/28/2023   CO2 27 07/28/2023    RADIOGRAPHIC STUDIES: I have personally reviewed the radiological reports and agreed with the findings in  the report.  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 Ki 67 20%. Oncotype DX score of 16 indicates low recurrence risk, no chemotherapy benefit. Radiation therapy recommended. 9-year recurrence risk ~4% with radiation and anti-estrogen therapy.   Breast cancer, currently receiving radiation therapy Breast cancer undergoing radiation therapy, expected to complete next week. Discussed hormonal therapy options post-radiation to reduce recurrence risk. She preferred anastrozole /letrozole due to concerns about tamoxifen's side effects. - Prescribe anastrozole  for 3 months. - Send prescription to [facility] on [address]. - Schedule follow-up in 3 months to assess tolerance and effectiveness. - Order bone density scan at [facility] or [facility] for baseline assessment.  Cancer treatment-related fatigue Fatigue due to radiation therapy for breast cancer. - Encourage resumption of water exercises post-radiation therapy to improve energy levels and manage weight.  Weight gain Weight gain due to decreased physical activity during cancer treatment. - Encourage resumption of water exercises post-radiation therapy to aid in weight management.  Osteoarthritis Osteoarthritis with concerns about worsening symptoms with anastrozole /letrozole. She is willing to try anastrozole /letrozole for 3 months. - Monitor for any increase in arthritic symptoms while on anastrozole /letrozole.  Baseline bone density ordered.  Time spent: 30 min  All questions were answered. The patient knows to call the clinic with any problems, questions or concerns.  Amber Stalls, MD 10/25/23

## 2023-10-26 ENCOUNTER — Ambulatory Visit
Admission: RE | Admit: 2023-10-26 | Discharge: 2023-10-26 | Disposition: A | Discharge status: Home / Self Care | Source: Ambulatory Visit | Attending: Radiation Oncology | Admitting: Radiation Oncology

## 2023-10-26 ENCOUNTER — Other Ambulatory Visit: Payer: Self-pay

## 2023-10-26 DIAGNOSIS — Z170 Estrogen receptor positive status [ER+]: Secondary | ICD-10-CM | POA: Diagnosis not present

## 2023-10-26 DIAGNOSIS — C50411 Malignant neoplasm of upper-outer quadrant of right female breast: Secondary | ICD-10-CM | POA: Diagnosis not present

## 2023-10-26 DIAGNOSIS — Z510 Encounter for antineoplastic radiation therapy: Secondary | ICD-10-CM | POA: Diagnosis not present

## 2023-10-26 LAB — RAD ONC ARIA SESSION SUMMARY
Course Elapsed Days: 37
Plan Fractions Treated to Date: 28
Plan Prescribed Dose Per Fraction: 1.8 Gy
Plan Total Fractions Prescribed: 28
Plan Total Prescribed Dose: 50.4 Gy
Reference Point Dosage Given to Date: 50.4 Gy
Reference Point Session Dosage Given: 1.8 Gy
Session Number: 28

## 2023-10-27 ENCOUNTER — Ambulatory Visit
Admission: RE | Admit: 2023-10-27 | Discharge: 2023-10-27 | Disposition: A | Discharge status: Home / Self Care | Source: Ambulatory Visit | Attending: Radiation Oncology | Admitting: Radiation Oncology

## 2023-10-27 ENCOUNTER — Other Ambulatory Visit: Payer: Self-pay

## 2023-10-27 DIAGNOSIS — C50411 Malignant neoplasm of upper-outer quadrant of right female breast: Secondary | ICD-10-CM | POA: Diagnosis not present

## 2023-10-27 LAB — RAD ONC ARIA SESSION SUMMARY
Course Elapsed Days: 38
Plan Fractions Treated to Date: 1
Plan Prescribed Dose Per Fraction: 2 Gy
Plan Total Fractions Prescribed: 5
Plan Total Prescribed Dose: 10 Gy
Reference Point Dosage Given to Date: 2 Gy
Reference Point Session Dosage Given: 2 Gy
Session Number: 29

## 2023-10-28 ENCOUNTER — Other Ambulatory Visit: Payer: Self-pay

## 2023-10-28 ENCOUNTER — Ambulatory Visit
Admission: RE | Admit: 2023-10-28 | Discharge: 2023-10-28 | Disposition: A | Discharge status: Home / Self Care | Source: Ambulatory Visit | Attending: Radiation Oncology | Admitting: Radiation Oncology

## 2023-10-28 DIAGNOSIS — Z170 Estrogen receptor positive status [ER+]: Secondary | ICD-10-CM | POA: Diagnosis not present

## 2023-10-28 DIAGNOSIS — Z510 Encounter for antineoplastic radiation therapy: Secondary | ICD-10-CM | POA: Diagnosis not present

## 2023-10-28 DIAGNOSIS — C50411 Malignant neoplasm of upper-outer quadrant of right female breast: Secondary | ICD-10-CM | POA: Diagnosis not present

## 2023-10-28 LAB — RAD ONC ARIA SESSION SUMMARY
Course Elapsed Days: 39
Plan Fractions Treated to Date: 2
Plan Prescribed Dose Per Fraction: 2 Gy
Plan Total Fractions Prescribed: 5
Plan Total Prescribed Dose: 10 Gy
Reference Point Dosage Given to Date: 4 Gy
Reference Point Session Dosage Given: 2 Gy
Session Number: 30

## 2023-11-01 ENCOUNTER — Ambulatory Visit
Admission: RE | Admit: 2023-11-01 | Discharge: 2023-11-01 | Disposition: A | Discharge status: Home / Self Care | Source: Ambulatory Visit | Attending: Radiation Oncology | Admitting: Radiation Oncology

## 2023-11-01 ENCOUNTER — Other Ambulatory Visit: Payer: Self-pay

## 2023-11-01 DIAGNOSIS — C50411 Malignant neoplasm of upper-outer quadrant of right female breast: Secondary | ICD-10-CM | POA: Diagnosis not present

## 2023-11-01 DIAGNOSIS — Z170 Estrogen receptor positive status [ER+]: Secondary | ICD-10-CM | POA: Insufficient documentation

## 2023-11-01 LAB — RAD ONC ARIA SESSION SUMMARY
Course Elapsed Days: 43
Plan Fractions Treated to Date: 3
Plan Prescribed Dose Per Fraction: 2 Gy
Plan Total Fractions Prescribed: 5
Plan Total Prescribed Dose: 10 Gy
Reference Point Dosage Given to Date: 6 Gy
Reference Point Session Dosage Given: 2 Gy
Session Number: 31

## 2023-11-02 ENCOUNTER — Ambulatory Visit

## 2023-11-02 ENCOUNTER — Other Ambulatory Visit: Payer: Self-pay

## 2023-11-02 ENCOUNTER — Ambulatory Visit
Admission: RE | Admit: 2023-11-02 | Discharge: 2023-11-02 | Disposition: A | Discharge status: Home / Self Care | Source: Ambulatory Visit | Attending: Radiation Oncology | Admitting: Radiation Oncology

## 2023-11-02 DIAGNOSIS — C50411 Malignant neoplasm of upper-outer quadrant of right female breast: Secondary | ICD-10-CM | POA: Diagnosis not present

## 2023-11-02 LAB — RAD ONC ARIA SESSION SUMMARY
Course Elapsed Days: 44
Plan Fractions Treated to Date: 4
Plan Prescribed Dose Per Fraction: 2 Gy
Plan Total Fractions Prescribed: 5
Plan Total Prescribed Dose: 10 Gy
Reference Point Dosage Given to Date: 8 Gy
Reference Point Session Dosage Given: 2 Gy
Session Number: 32

## 2023-11-03 ENCOUNTER — Other Ambulatory Visit: Payer: Self-pay

## 2023-11-03 ENCOUNTER — Ambulatory Visit
Admission: RE | Admit: 2023-11-03 | Discharge: 2023-11-03 | Disposition: A | Discharge status: Home / Self Care | Source: Ambulatory Visit | Attending: Radiation Oncology | Admitting: Radiation Oncology

## 2023-11-03 DIAGNOSIS — Z170 Estrogen receptor positive status [ER+]: Secondary | ICD-10-CM | POA: Diagnosis not present

## 2023-11-03 DIAGNOSIS — C50411 Malignant neoplasm of upper-outer quadrant of right female breast: Secondary | ICD-10-CM | POA: Diagnosis not present

## 2023-11-03 DIAGNOSIS — Z510 Encounter for antineoplastic radiation therapy: Secondary | ICD-10-CM | POA: Diagnosis not present

## 2023-11-03 LAB — RAD ONC ARIA SESSION SUMMARY
Course Elapsed Days: 45
Plan Fractions Treated to Date: 5
Plan Prescribed Dose Per Fraction: 2 Gy
Plan Total Fractions Prescribed: 5
Plan Total Prescribed Dose: 10 Gy
Reference Point Dosage Given to Date: 10 Gy
Reference Point Session Dosage Given: 2 Gy
Session Number: 33

## 2023-11-04 NOTE — Radiation Completion Notes (Addendum)
  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, ER+ / PR+ / Her2-, Grade 2  Intent: Curative     ==========DELIVERED PLANS==========  First Treatment Date: 2023-09-19 Last Treatment Date: 2023-11-03   Plan Name: Breast_R Site: Breast, Right Technique: 3D Mode: Photon Dose Per Fraction: 1.8 Gy Prescribed Dose (Delivered / Prescribed): 50.4 Gy / 50.4 Gy Prescribed Fxs (Delivered / Prescribed): 28 / 28   Plan Name: Breast_R_Bst Site: Breast, Right Technique: 3D Mode: Photon Dose Per Fraction: 2 Gy Prescribed Dose (Delivered / Prescribed): 10 Gy / 10 Gy Prescribed Fxs (Delivered / Prescribed): 5 / 5     ====================================   The patient tolerated radiation. She developed severe fatigue and anticipated skin changes in the treatment field.   The patient will return in one month and will continue follow up with Dr. Loretha as well.      Ronita Due, PA-C

## 2023-11-10 DIAGNOSIS — Z853 Personal history of malignant neoplasm of breast: Secondary | ICD-10-CM | POA: Diagnosis not present

## 2023-11-10 DIAGNOSIS — N3941 Urge incontinence: Secondary | ICD-10-CM | POA: Diagnosis not present

## 2023-11-10 DIAGNOSIS — D071 Carcinoma in situ of vulva: Secondary | ICD-10-CM | POA: Diagnosis not present

## 2023-11-25 DIAGNOSIS — B880 Other acariasis: Secondary | ICD-10-CM | POA: Diagnosis not present

## 2023-11-25 DIAGNOSIS — H35372 Puckering of macula, left eye: Secondary | ICD-10-CM | POA: Diagnosis not present

## 2023-11-25 DIAGNOSIS — H25813 Combined forms of age-related cataract, bilateral: Secondary | ICD-10-CM | POA: Diagnosis not present

## 2023-11-30 ENCOUNTER — Encounter: Payer: Self-pay | Admitting: Radiation Oncology

## 2023-12-03 NOTE — Progress Notes (Signed)
 Radiation Oncology         [phone] ________________________________  Name: Tammy Mccoy MRN: [id number]  Date: 12/05/2023  DOB: [birth year]  Follow-Up Visit Note  CC: Teresa Channel, MD  Teresa Channel, MD  No diagnosis found.  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, ER+ / PR+ / Her2-, Grade 2   Interval Since Last Radiation: 1 month and 2 days   Intent: Curative  Radiation Treatment Dates: First Treatment Date: 2023-09-19 -- Last Treatment Date: 2023-11-03 Site/Dose/Technique/Mode:  Plan Name: Breast_R Site: Breast, Right Technique: 3D Mode: Photon Dose Per Fraction: 1.8 Gy Prescribed Dose (Delivered / Prescribed): 50.4 Gy / 50.4 Gy Prescribed Fxs (Delivered / Prescribed): 28 / 28   Plan Name: Breast_R_Bst Site: Breast, Right Technique: 3D Mode: Photon Dose Per Fraction: 2 Gy Prescribed Dose (Delivered / Prescribed): 10 Gy / 10 Gy Prescribed Fxs (Delivered / Prescribed): 5 / 5  Narrative:  The patient returns today for routine follow-up. She did develop severe fatigue and anticipated skin changes in the treatment field from radiation therapy. She otherwise tolerated her treatment course well and denied any other concerns from radiation therapy.   She most recently followed up with Dr. Loretha on 10/25/23 to discuss antiestrogen treatment options. She has opted to proceed with  anastrozole  following radiation therapy.   No other significant oncologic interval history since the patient completed radiation therapy, or in the interval since she began radiation therapy.   ***                               Allergies:  is allergic to myrbetriq [mirabegron], amoxicillin, augmentin [amoxicillin-pot clavulanate], bupropion, clindamycin /lincomycin, doxycycline, duloxetine, ibuprofen, oxybutynin chloride, ozempic (0.25 or 0.5 mg-dose) [semaglutide(0.25 or 0.5mg -dos)], rosuvastatin, and trintellix  [vortioxetine].  Meds: Current Outpatient Medications  Medication Sig Dispense Refill   acetaminophen  (TYLENOL ) 500 MG tablet Take 2 tablets (1,000 mg total) by mouth every 8 (eight) hours as needed for mild pain (pain score 1-3). 30 tablet 0   acetaminophen  (TYLENOL ) 650 MG CR tablet Take 1,300 mg by mouth every 8 (eight) hours as needed for pain.     ALPRAZolam (XANAX) 0.5 MG tablet Take 0.25-0.5 mg by mouth at bedtime as needed for sleep.      anastrozole  (ARIMIDEX ) 1 MG tablet Take 1 tablet (1 mg total) by mouth daily. 90 tablet 0   Cholecalciferol (VITAMIN D3) 2000 units capsule Take 2,000 Units by mouth daily.     Cyanocobalamin (B-12 PO) Take 1,000 mcg by mouth daily.     doxycycline (MONODOX) 50 MG capsule Take 50 mg by mouth as directed. Once daily     FLUoxetine (PROZAC) 10 MG capsule Take 30 mg by mouth daily.     fluticasone (FLONASE) 50 MCG/ACT nasal spray Place 1 spray into both nostrils daily as needed for allergies or rhinitis.     ketoconazole (NIZORAL) 2 % cream Apply 1 Application topically daily as needed (itching).     metFORMIN (GLUCOPHAGE-XR) 500 MG 24 hr tablet Take 500 mg by mouth at bedtime.     Multiple Vitamin (MULTIVITAMIN WITH MINERALS) TABS tablet Take 1 tablet by mouth daily.     naproxen  sodium (ALEVE ) 220 MG tablet Take 220 mg by mouth.     pravastatin (PRAVACHOL) 10 MG tablet Take 10 mg by mouth 2 (two) times a week.     triamterene-hydrochlorothiazide  (MAXZIDE-25) 37.5-25  MG tablet Take 0.5 tablets by mouth every morning.     No current facility-administered medications for this encounter.    Physical Findings: The patient is in no acute distress. Patient is alert and oriented.  vitals were not taken for this visit. .  No significant changes. Lungs are clear to auscultation bilaterally. Heart has regular rate and rhythm. No palpable cervical, supraclavicular, or axillary adenopathy. Abdomen soft, non-tender, normal bowel sounds.  Left Breast: no  palpable mass, nipple discharge or bleeding. Right Breast: ***  Lab Findings: Lab Results  Component Value Date   WBC 9.3 06/29/2023   HGB 14.3 06/29/2023   HCT 43.5 06/29/2023   MCV 89.7 06/29/2023   PLT 313 06/29/2023    Radiographic Findings: No results found.  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  ***  Plan:  ***   *** minutes of total time was spent for this patient encounter, including preparation, face-to-face counseling with the patient and coordination of care, physical exam, and documentation of the encounter. ____________________________________  Lynwood CHARM Nasuti, PhD, MD  This document serves as a record of services personally performed by Lynwood Nasuti, MD. It was created on his behalf by Dorthy Fuse, a trained medical scribe. The creation of this record is based on the scribe's personal observations and the provider's statements to them. This document has been checked and approved by the attending provider.

## 2023-12-05 ENCOUNTER — Encounter: Payer: Self-pay | Admitting: Radiation Oncology

## 2023-12-05 ENCOUNTER — Ambulatory Visit
Admission: RE | Admit: 2023-12-05 | Discharge: 2023-12-05 | Disposition: A | Discharge status: Home / Self Care | Source: Ambulatory Visit | Attending: Radiation Oncology | Admitting: Radiation Oncology

## 2023-12-05 VITALS — BP 134/74 | HR 77 | Temp 97.4°F | Resp 20 | Ht 65.0 in | Wt 291.2 lb

## 2023-12-05 DIAGNOSIS — Z7984 Long term (current) use of oral hypoglycemic drugs: Secondary | ICD-10-CM | POA: Insufficient documentation

## 2023-12-05 DIAGNOSIS — Z1721 Progesterone receptor positive status: Secondary | ICD-10-CM | POA: Diagnosis not present

## 2023-12-05 DIAGNOSIS — C50411 Malignant neoplasm of upper-outer quadrant of right female breast: Secondary | ICD-10-CM | POA: Insufficient documentation

## 2023-12-05 DIAGNOSIS — Z79899 Other long term (current) drug therapy: Secondary | ICD-10-CM | POA: Insufficient documentation

## 2023-12-05 DIAGNOSIS — Z170 Estrogen receptor positive status [ER+]: Secondary | ICD-10-CM | POA: Insufficient documentation

## 2023-12-05 DIAGNOSIS — Z923 Personal history of irradiation: Secondary | ICD-10-CM | POA: Diagnosis not present

## 2023-12-05 DIAGNOSIS — Z79811 Long term (current) use of aromatase inhibitors: Secondary | ICD-10-CM | POA: Insufficient documentation

## 2023-12-05 DIAGNOSIS — Z1732 Human epidermal growth factor receptor 2 negative status: Secondary | ICD-10-CM | POA: Insufficient documentation

## 2023-12-05 NOTE — Progress Notes (Signed)
 Tammy Mccoy is here today for follow up post radiation to the breast.   Breast Side:Right    They completed their radiation on:  11/03/23  Does the patient complain of any of the following: Post radiation skin issues: Patient reports having a cyst to right breast.  Breast Tenderness:  No Breast Swelling: No Lymphadema: No Range of Motion limitations: No Fatigue post radiation: Yes, but improving  Appetite good/fair/poor: good  Additional comments if applicable:  Patient has not yet started anastrozole . Patient to have bone density test once scheduled.   BP 134/74 (BP Location: Right Arm, Patient Position: Sitting, Cuff Size: Large)   Pulse 77   Temp (!) 97.4 F (36.3 C)   Resp 20   Ht 5' 5 (1.651 m)   Wt 291 lb 3.2 oz (132.1 kg)   SpO2 99%   BMI 48.46 kg/m

## 2023-12-08 ENCOUNTER — Telehealth: Payer: Self-pay

## 2023-12-08 NOTE — Telephone Encounter (Signed)
 Pt called and states our scheduler called to schedule MD f/u for her in 2 weeks to assess response to Anastrozole . Pt has not yet started because she has not called to schedule her DEXA. She asked to go to [place]. Order placed for DEXA on [facility] portal. Message forwarded to MD to advise about appts since she has not scheduled DEXA yet.

## 2023-12-09 ENCOUNTER — Telehealth: Payer: Self-pay | Admitting: *Deleted

## 2023-12-09 NOTE — Telephone Encounter (Signed)
 Per Dr. Loretha, called and left vm on pt person phone to make aware that she is OK to start anastrozole  before DEXA scan is scheduled and completed. Advised to call office if there are any questions or concerns

## 2024-01-20 DIAGNOSIS — Z780 Asymptomatic menopausal state: Secondary | ICD-10-CM | POA: Diagnosis not present

## 2024-01-23 ENCOUNTER — Encounter: Payer: Self-pay | Admitting: Hematology and Oncology

## 2024-01-30 ENCOUNTER — Inpatient Hospital Stay

## 2024-01-30 ENCOUNTER — Inpatient Hospital Stay: Attending: Adult Health | Admitting: Adult Health

## 2024-01-30 ENCOUNTER — Encounter: Payer: Self-pay | Admitting: Adult Health

## 2024-01-30 VITALS — BP 130/59 | HR 76 | Temp 99.0°F | Resp 18 | Ht 65.0 in | Wt 298.0 lb

## 2024-01-30 DIAGNOSIS — Z87891 Personal history of nicotine dependence: Secondary | ICD-10-CM | POA: Diagnosis not present

## 2024-01-30 DIAGNOSIS — Z923 Personal history of irradiation: Secondary | ICD-10-CM | POA: Insufficient documentation

## 2024-01-30 DIAGNOSIS — Z803 Family history of malignant neoplasm of breast: Secondary | ICD-10-CM | POA: Insufficient documentation

## 2024-01-30 DIAGNOSIS — C50411 Malignant neoplasm of upper-outer quadrant of right female breast: Secondary | ICD-10-CM | POA: Insufficient documentation

## 2024-01-30 DIAGNOSIS — Z1721 Progesterone receptor positive status: Secondary | ICD-10-CM | POA: Insufficient documentation

## 2024-01-30 DIAGNOSIS — Z808 Family history of malignant neoplasm of other organs or systems: Secondary | ICD-10-CM | POA: Insufficient documentation

## 2024-01-30 DIAGNOSIS — Z1732 Human epidermal growth factor receptor 2 negative status: Secondary | ICD-10-CM | POA: Insufficient documentation

## 2024-01-30 DIAGNOSIS — Z8052 Family history of malignant neoplasm of bladder: Secondary | ICD-10-CM | POA: Diagnosis not present

## 2024-01-30 DIAGNOSIS — Z170 Estrogen receptor positive status [ER+]: Secondary | ICD-10-CM | POA: Diagnosis not present

## 2024-01-30 NOTE — Progress Notes (Signed)
 SURVIVORSHIP VISIT:  BRIEF ONCOLOGIC HISTORY:  Oncology History  Malignant neoplasm of upper-outer quadrant of right breast in female, estrogen receptor positive (HCC)  05/24/2017 Genetic Testing   No pathogenic variants were detected. Of note, a VUS was identified in the MET gene (c.1030G>A (p.Gly344Arg)) on the multicancer panel. Report date is 05/25/2017.  Update: The MET VUS (c.1030G>A) has been reclassified to likely benign. Report date is 07/13/2021.  The Multi-Gene Panel offered by [facility] includes sequencing and/or deletion duplication testing of the following 83 genes: ALK, APC, ATM, AXIN2,BAP1,  BARD1, BLM, BMPR1A, BRCA1, BRCA2, BRIP1, CASR, CDC73, CDH1, CDK4, CDKN1B, CDKN1C, CDKN2A (p14ARF), CDKN2A (p16INK4a), CEBPA, CHEK2, CTNNA1, DICER1, DIS3L2, EGFR (c.2369C>T, p.Thr790Met variant only), EPCAM (Deletion/duplication testing only), FH, FLCN, GATA2, GPC3, GREM1 (Promoter region deletion/duplication testing only), HOXB13 (c.251G>A, p.Gly84Glu), HRAS, KIT, MAX, MEN1, MET, MITF (c.952G>A, p.Glu318Lys variant only), MLH1, MSH2, MSH3, MSH6, MUTYH, NBN, NF1, NF2, NTHL1, PALB2, PDGFRA, PHOX2B, PMS2, POLD1, POLE, POT1, PRKAR1A, PTCH1, PTEN, RAD50, RAD51C, RAD51D, RB1, RECQL4, RET, RUNX1, SDHAF2, SDHA (sequence changes only), SDHB, SDHC, SDHD, SMAD4, SMARCA4, SMARCB1, SMARCE1, STK11, SUFU, TERT, TERT, TMEM127, TP53, TSC1, TSC2, VHL, WRN and WT1.  The report date is May 24, 2017.   06/27/2023 Initial Diagnosis   Malignant neoplasm of upper-outer quadrant of right breast in female, estrogen receptor positive (HCC)   06/29/2023 Cancer Staging   Staging form: Breast, AJCC 8th Edition - Clinical stage from 06/29/2023: Stage IA (cT1b, cN0, cM0, G2, ER+, PR+, HER2-) - Signed by Loretha Ash, MD on 06/29/2023 Stage prefix: Initial diagnosis Histologic grading system: 3 grade system   09/19/2023 - 11/03/2023 Radiation Therapy   Plan Name: Breast_R Site: Breast, Right Technique: 3D Mode: Photon Dose Per  Fraction: 1.8 Gy Prescribed Dose (Delivered / Prescribed): 50.4 Gy / 50.4 Gy Prescribed Fxs (Delivered / Prescribed): 28 / 28   Plan Name: Breast_R_Bst Site: Breast, Right Technique: 3D Mode: Photon Dose Per Fraction: 2 Gy Prescribed Dose (Delivered / Prescribed): 10 Gy / 10 Gy Prescribed Fxs (Delivered / Prescribed): 5 / 5   10/2023 -  Anti-estrogen oral therapy   Anastrozole      INTERVAL HISTORY:  Discussed the use of AI scribe software for clinical note transcription with the patient, who gave verbal consent to proceed.  History of Present Illness Tammy Mccoy is a 73 year old female with right-sided breast cancer who presents for follow-up care.  She notes intermittent right-sided breast tenderness that she associates with right-hand use, but she has no breast pain today.  She was diagnosed with stage IA right-sided ER/PR-positive breast cancer. Genetic testing was negative. She underwent lumpectomy and radiation and is on anastrozole  without medication-related issues. She reports weight gain, which she associates with eating behavior.  She has severe sleep apnea but is not using CPAP due to insurance barriers. Her mother and sister had thyroid  cancer. She has thyroid  nodules and follows with a thyroid  specialist.     REVIEW OF SYSTEMS:  Review of Systems  Constitutional:  Negative for appetite change, chills, fatigue, fever and unexpected weight change.  HENT:   Negative for hearing loss, lump/mass and trouble swallowing.   Eyes:  Negative for eye problems and icterus.  Respiratory:  Negative for chest tightness, cough and shortness of breath.   Cardiovascular:  Negative for chest pain, leg swelling and palpitations.  Gastrointestinal:  Negative for abdominal distention, abdominal pain, constipation, diarrhea, nausea and vomiting.  Endocrine: Negative for hot flashes.  Genitourinary:  Negative for difficulty urinating.   Musculoskeletal:  Negative for  arthralgias.  Skin:  Negative for itching and rash.  Neurological:  Negative for dizziness, extremity weakness, headaches and numbness.  Hematological:  Negative for adenopathy. Does not bruise/bleed easily.  Psychiatric/Behavioral:  Negative for depression. The patient is not nervous/anxious.          PAST MEDICAL/SURGICAL HISTORY:  Past Medical History:  Diagnosis Date   Alcohol abuse    pt stopped drinking alcohol around 2009   Anxiety    Arthritis    bil knees and neck   Asthma    excersice   B12 deficiency    Back pain    Breast cancer (HCC) 06/2023   right breast IDC   Claustrophobia    Claustrophobia    Colon polyps    Constipation    COPD (chronic obstructive pulmonary disease) (HCC)    Depression    Diabetes mellitus without complication (HCC)    Difficult intubation 06/11/2016   glidescope grade 2, pt states there was no difficulty with airway during cholecystectomy   Diverticulitis    Family history of adverse reaction to anesthesia    sister has vomiting   Family history of bladder cancer    Family history of breast cancer    Family history of colon cancer    Family history of thyroid  cancer    Former smoker    H/O rosacea    History of radiation therapy    RIght breast-09/19/23-11/03/23- Dr. Lynwood Nasuti   Hyperlipidemia    Hyperparathyroidism    Hypertension    Joint pain    Multiple thyroid  nodules    Multiple thyroid  nodules    benign has checked yearly   Multiple thyroid  nodules    benign per pt   Palpitations    Skin cancer, basal cell    Sleep apnea    no cpap   SOB (shortness of breath)    Squamous cell skin cancer    Swelling of both lower extremities    Thyroid  disease    Vitamin D deficiency    Past Surgical History:  Procedure Laterality Date   BREAST LUMPECTOMY WITH RADIOACTIVE SEED LOCALIZATION Right 08/02/2023   Procedure: BREAST LUMPECTOMY WITH RADIOACTIVE SEED LOCALIZATION;  Surgeon: Vanderbilt Ned, MD;  Location: [facility];  Service: General;  Laterality: Right;  RIGHT BREAST SEED LUMPECTOMY   CARPAL TUNNEL RELEASE     Bilateral   CESAREAN SECTION     x 2   CHOLECYSTECTOMY     ENDOVENOUS ABLATION SAPHENOUS VEIN W/ LASER Left 11/16/2016   endovenous laser ablation L GSV and stab phlebectomy > 20 incisions left leg by Lynwood Collum MD    PARATHYROIDECTOMY Left 06/11/2016   Procedure: LEFT PARATHYROIDECTOMY;  Surgeon: Krystal Spinner, MD;  Location: [facility];  Service: General;  Laterality: Left;   TONSILLECTOMY AND ADENOIDECTOMY     TUBAL LIGATION     VULVECTOMY N/A 02/16/2023   Procedure: LYNDLE;  Surgeon: Rosalva Sawyer, MD;  Location[facility];  Service: Gynecology;  Laterality: N/A;     ALLERGIES:  Allergies  Allergen Reactions   Myrbetriq [Mirabegron] Shortness Of Breath and Swelling    Other reaction(s): SOB and swelling in feet   Amoxicillin Hives   Augmentin [Amoxicillin-Pot Clavulanate] Hives   Bupropion     Worsened depression    Clindamycin /Lincomycin Hives   Doxycycline Hives   Duloxetine Other (See Comments)    Other reaction(s): more depressed at high dose   Ibuprofen Itching and Swelling  Lower lip swelling    Oxybutynin Chloride Other (See Comments)    Did not help   Ozempic (0.25 Or 0.5 Mg-Dose) [Semaglutide(0.25 Or 0.5mg -Dos)] Diarrhea   Rosuvastatin     Other reaction(s): leg pain, Other (See Comments)   Trintellix [Vortioxetine] Other (See Comments)    Increased anxiety     CURRENT MEDICATIONS:  Outpatient Encounter Medications as of 01/30/2024  Medication Sig   acetaminophen  (TYLENOL ) 500 MG tablet Take 2 tablets (1,000 mg total) by mouth every 8 (eight) hours as needed for mild pain (pain score 1-3).   acetaminophen  (TYLENOL ) 650 MG CR tablet Take 1,300 mg by mouth every 8 (eight) hours as needed for pain.   ALPRAZolam (XANAX) 0.5 MG tablet Take 0.25-0.5 mg by mouth at bedtime as needed for sleep.    anastrozole  (ARIMIDEX ) 1 MG tablet Take 1 tablet (1 mg  total) by mouth daily.   Cholecalciferol (VITAMIN D3) 2000 units capsule Take 2,000 Units by mouth daily.   Cyanocobalamin (B-12 PO) Take 1,000 mcg by mouth daily.   FLUoxetine (PROZAC) 10 MG capsule Take 30 mg by mouth daily.   fluticasone (FLONASE) 50 MCG/ACT nasal spray Place 1 spray into both nostrils daily as needed for allergies or rhinitis.   ketoconazole (NIZORAL) 2 % cream Apply 1 Application topically daily as needed (itching).   metFORMIN (GLUCOPHAGE-XR) 500 MG 24 hr tablet Take 500 mg by mouth at bedtime.   Multiple Vitamin (MULTIVITAMIN WITH MINERALS) TABS tablet Take 1 tablet by mouth daily.   naproxen  sodium (ALEVE ) 220 MG tablet Take 220 mg by mouth.   pravastatin (PRAVACHOL) 10 MG tablet Take 10 mg by mouth 2 (two) times a week.   triamterene-hydrochlorothiazide  (MAXZIDE-25) 37.5-25 MG tablet Take 0.5 tablets by mouth every morning.   [DISCONTINUED] doxycycline (ADOXA) 50 MG tablet Take 50 mg by mouth daily.   [DISCONTINUED] doxycycline (MONODOX) 50 MG capsule Take 50 mg by mouth as directed. Once daily   No facility-administered encounter medications on file as of 01/30/2024.     ONCOLOGIC FAMILY HISTORY:  Family History  Problem Relation Age of Onset   CVA Mother 72   Thyroid  cancer Mother        dx in her 60s   Colon cancer Mother        dx in her 74s, d. 83   Lung cancer Mother        dx in 73s   Cancer Mother        parotid gland cancer, d. 7   Stroke Mother    Depression Mother    Anxiety disorder Mother    Alcoholism Mother    Cancer Father        Bladder   Depression Father    Alcoholism Father    Cancer Sister        breast, thyroid  and colon   Hyperparathyroidism Sister    Thyroid  cancer Sister        dx in 82s, 2nd time at 15   Breast cancer Sister 34       second at 33   Diabetes Maternal Grandmother    COPD Maternal Grandfather    Breast cancer Paternal Grandmother        dx in 53s; d. 92   Thyroid  cancer Other        dx late 36s;  daughter of sister with thyroid  cancer     SOCIAL HISTORY:  Social History   Socioeconomic History   Marital status: Widowed  Spouse name: Not on file   Number of children: 2   Years of education: Not on file   Highest education level: Not on file  Occupational History   Occupation: retired  Tobacco Use   Smoking status: Former    Current packs/day: 0.00    Average packs/day: 1 pack/day for 38.0 years (38.0 ttl pk-yrs)    Types: Cigarettes    Start date: 03/01/1970    Quit date: 03/01/2008    Years since quitting: 15.9   Smokeless tobacco: Never  Vaping Use   Vaping status: Never Used  Substance and Sexual Activity   Alcohol use: No    Comment: pt quit drinking around 2009   Drug use: No   Sexual activity: Not Currently    Birth control/protection: Post-menopausal, Surgical    Comment: BTL  Other Topics Concern   Not on file  Social History Narrative   Used to drink alcohol.  None in the last 10 years.   Son lives with her.    She has 3 grands.  She helps take care of them.    Social Drivers of Health   Financial Resource Strain: Medium Risk (01/30/2024)   Overall Financial Resource Strain (CARDIA)    Difficulty of Paying Living Expenses: Somewhat hard  Food Insecurity: No Food Insecurity (01/30/2024)   Hunger Vital Sign    Worried About Running Out of Food in the Last Year: Never true    Ran Out of Food in the Last Year: Never true  Recent Concern: Food Insecurity - Food Insecurity Present (01/30/2024)   Hunger Vital Sign    Worried About Running Out of Food in the Last Year: Sometimes true    Ran Out of Food in the Last Year: Sometimes true  Transportation Needs: No Transportation Needs (01/30/2024)   PRAPARE - Administrator, Civil Service (Medical): No    Lack of Transportation (Non-Medical): No  Physical Activity: Not on file  Stress: Stress Concern Present (01/30/2024)   [employer] - Occupational Stress Questionnaire     Feeling of Stress: Very much  Social Connections: Socially Isolated (01/30/2024)   Social Connection and Isolation Panel    Frequency of Communication with Friends and Family: Three times a week    Frequency of Social Gatherings with Friends and Family: Three times a week    Attends Religious Services: Never    Active Member of Clubs or Organizations: No    Attends Banker Meetings: Never    Marital Status: Widowed  Intimate Partner Violence: Not At Risk (01/30/2024)   Humiliation, Afraid, Rape, and Kick questionnaire    Fear of Current or Ex-Partner: No    Emotionally Abused: No    Physically Abused: No    Sexually Abused: No     OBSERVATIONS/OBJECTIVE:  BP (!) 130/59 (BP Location: Left Arm, Patient Position: Sitting)   Pulse 76   Temp 99 F (37.2 C) (Tympanic)   Resp 18   Ht 5' 5 (1.651 m)   Wt 298 lb (135.2 kg)   SpO2 96%   BMI 49.59 kg/m  GENERAL: Patient is a well appearing female in no acute distress HEENT:  Sclerae anicteric.  Oropharynx clear and moist. No ulcerations or evidence of oropharyngeal candidiasis. Neck is supple.  NODES:  No cervical, supraclavicular, or axillary lymphadenopathy palpated.  BREAST EXAM: Right breast status postlumpectomy and radiation no sign of local recurrence, faint erythema noted on right breast, no swelling, warmth, or tenderness  to palpation, left breast is benign. LUNGS:  Clear to auscultation bilaterally.  No wheezes or rhonchi. HEART:  Regular rate and rhythm. No murmur appreciated. ABDOMEN:  Soft, nontender.  Positive, normoactive bowel sounds. No organomegaly palpated. MSK:  No focal spinal tenderness to palpation. Full range of motion bilaterally in the upper extremities. EXTREMITIES:  No peripheral edema.   SKIN:  Clear with no obvious rashes or skin changes. No nail dyscrasia. NEURO:  Nonfocal. Well oriented.  Appropriate affect.   LABORATORY DATA:  None for this visit.  DIAGNOSTIC IMAGING:  None for this  visit.      ASSESSMENT AND PLAN:  Ms.. Stansell is a pleasant 73 y.o. female with Stage 1A right breast invasive ductal carcinoma, ER+/PR+/HER2-, diagnosed in March 2025, treated with lumpectomy, adjuvant radiation therapy, and anti-estrogen therapy with anastrozole  beginning in September 2025.  She presents to the Survivorship Clinic for our initial meeting and routine follow-up post-completion of treatment for breast cancer.    1. Stage 1A right breast cancer:  Ms. Bittinger is continuing to recover from definitive treatment for breast cancer. She will follow-up with her medical oncologist, Dr.  Loretha with history and physical exam per surveillance protocol.  She will continue her anti-estrogen therapy with anastrozole . Thus far, she is tolerating the anastrozole  well, with minimal side effects. Her mammogram is due April 2026; orders placed today.  We discussed guardant reveal blood testing for circulating tumor DNA and she would like to proceed with testing.  She will undergo this today and then again in 6 months before her visit with Dr. Loretha.  Today, a comprehensive survivorship care plan and treatment summary was reviewed with the patient today detailing her breast cancer diagnosis, treatment course, potential late/long-term effects of treatment, appropriate follow-up care with recommendations for the future, and patient education resources.  A copy of this summary, along with a letter will be sent to the patient's primary care provider via mail/fax/In Basket message after today's visit.    2. Faint breast erythema: She does not have any other signs of infection today.  I reviewed that if she develops breast tenderness, warmth, increase in redness, or swelling to let me know and we can send in antibiotics.  3. Bone health:  Given Ms. Kujawa's age/history of breast cancer and her current treatment regimen including anti-estrogen therapy with anastrozole , she is at risk for bone demineralization.   Her most recent bone density testing occurred on January 20, 2024 and was normal.  Repeat bone density testing is recommended to occur in November 2027 she was given education on specific activities to promote bone health.  4. Cancer screening:  Due to Ms. Keleher's history and her age, she should receive screening for skin cancers, colon cancer, and gynecologic cancers.  She also will continue to undergo lung cancer screening with pulmonology.  The information and recommendations are listed on the patient's comprehensive care plan/treatment summary and were reviewed in detail with the patient.    5. Health maintenance and wellness promotion: Ms. Emmons was encouraged to consume 5-7 servings of fruits and vegetables per day. We reviewed the Nutrition Rainbow handout.  She was also encouraged to engage in moderate to vigorous exercise for 30 minutes per day most days of the week.  She was instructed to limit her alcohol consumption and continue to abstain from tobacco use.     6. Support services/counseling: It is not uncommon for this period of the patient's cancer care trajectory to be one of many  emotions and stressors.   She was given information regarding our available services and encouraged to contact me with any questions or for help enrolling in any of our support group/programs.    Follow up instructions:    -Return to cancer center in 6 months for follow-up with Dr. Loretha Vickey reveal blood testing every 6 months, December and June -Tentative follow-up with Dr. Vanderbilt in March and again in December 2026. -Mammogram due in 04/2024 -Bone density testing in November 2027 -She is welcome to return back to the Survivorship Clinic at any time; no additional follow-up needed at this time.  -Consider referral back to survivorship as a long-term survivor for continued surveillance  The patient was provided an opportunity to ask questions and all were answered. The patient agreed with  the plan and demonstrated an understanding of the instructions.   Total encounter time:45 minutes*in face-to-face visit time, chart review, lab review, care coordination, order entry, and documentation of the encounter time.    Morna Kendall, NP 01/30/24 10:03 AM Medical Oncology and Hematology [facility] [address] [place], [place] [address] Tel. [phone]    Fax. [phone]  *Total Encounter Time as defined by the Centers for Medicare and Medicaid Services includes, in addition to the face-to-face time of a patient visit (documented in the note above) non-face-to-face time: obtaining and reviewing outside history, ordering and reviewing medications, tests or procedures, care coordination (communications with other health care professionals or caregivers) and documentation in the medical record.

## 2024-02-07 ENCOUNTER — Telehealth: Payer: Self-pay

## 2024-02-07 ENCOUNTER — Encounter: Payer: Self-pay | Admitting: Adult Health

## 2024-02-07 NOTE — Telephone Encounter (Signed)
 Spoke with patient and confirmed appointment on 12/10

## 2024-02-08 ENCOUNTER — Inpatient Hospital Stay: Admitting: Hematology and Oncology

## 2024-02-08 VITALS — BP 151/67 | HR 88 | Temp 97.8°F | Resp 17 | Wt 298.7 lb

## 2024-02-08 DIAGNOSIS — C50411 Malignant neoplasm of upper-outer quadrant of right female breast: Secondary | ICD-10-CM | POA: Diagnosis not present

## 2024-02-08 DIAGNOSIS — Z170 Estrogen receptor positive status [ER+]: Secondary | ICD-10-CM | POA: Diagnosis not present

## 2024-02-08 MED ORDER — DOXYCYCLINE HYCLATE 100 MG PO TABS: 100.0000 mg | ORAL_TABLET | Freq: Two times a day (BID) | ORAL | 0 refills | Status: AC

## 2024-02-08 NOTE — Progress Notes (Signed)
 BRIEF ONCOLOGIC HISTORY:  Oncology History  Malignant neoplasm of upper-outer quadrant of right breast in female, estrogen receptor positive (HCC)  05/24/2017 Genetic Testing   No pathogenic variants were detected. Of note, a VUS was identified in the MET gene (c.1030G>A (p.Gly344Arg)) on the multicancer panel. Report date is 05/25/2017.  Update: The MET VUS (c.1030G>A) has been reclassified to likely benign. Report date is 07/13/2021.  The Multi-Gene Panel offered by [facility] includes sequencing and/or deletion duplication testing of the following 83 genes: ALK, APC, ATM, AXIN2,BAP1,  BARD1, BLM, BMPR1A, BRCA1, BRCA2, BRIP1, CASR, CDC73, CDH1, CDK4, CDKN1B, CDKN1C, CDKN2A (p14ARF), CDKN2A (p16INK4a), CEBPA, CHEK2, CTNNA1, DICER1, DIS3L2, EGFR (c.2369C>T, p.Thr790Met variant only), EPCAM (Deletion/duplication testing only), FH, FLCN, GATA2, GPC3, GREM1 (Promoter region deletion/duplication testing only), HOXB13 (c.251G>A, p.Gly84Glu), HRAS, KIT, MAX, MEN1, MET, MITF (c.952G>A, p.Glu318Lys variant only), MLH1, MSH2, MSH3, MSH6, MUTYH, NBN, NF1, NF2, NTHL1, PALB2, PDGFRA, PHOX2B, PMS2, POLD1, POLE, POT1, PRKAR1A, PTCH1, PTEN, RAD50, RAD51C, RAD51D, RB1, RECQL4, RET, RUNX1, SDHAF2, SDHA (sequence changes only), SDHB, SDHC, SDHD, SMAD4, SMARCA4, SMARCB1, SMARCE1, STK11, SUFU, TERT, TERT, TMEM127, TP53, TSC1, TSC2, VHL, WRN and WT1.  The report date is May 24, 2017.   06/27/2023 Initial Diagnosis   Malignant neoplasm of upper-outer quadrant of right breast in female, estrogen receptor positive (HCC)   06/29/2023 Cancer Staging   Staging form: Breast, AJCC 8th Edition - Clinical stage from 06/29/2023: Stage IA (cT1b, cN0, cM0, G2, ER+, PR+, HER2-) - Signed by Loretha Ash, MD on 06/29/2023 Stage prefix: Initial diagnosis Histologic grading system: 3 grade system   09/19/2023 - 11/03/2023 Radiation Therapy   Plan Name: Breast_R Site: Breast, Right Technique: 3D Mode: Photon Dose Per Fraction: 1.8  Gy Prescribed Dose (Delivered / Prescribed): 50.4 Gy / 50.4 Gy Prescribed Fxs (Delivered / Prescribed): 28 / 28   Plan Name: Breast_R_Bst Site: Breast, Right Technique: 3D Mode: Photon Dose Per Fraction: 2 Gy Prescribed Dose (Delivered / Prescribed): 10 Gy / 10 Gy Prescribed Fxs (Delivered / Prescribed): 5 / 5   10/2023 -  Anti-estrogen oral therapy   Anastrozole      INTERVAL HISTORY:  Discussed the use of AI scribe software for clinical note transcription with the patient, who gave verbal consent to proceed.  History of Present Illness  Tammy Mccoy is a 73 year old female with a history of breast cancer who presents with tenderness and discoloration in the right breast.  She experiences persistent tenderness in the area of her right breast and some redness of the right breast which has been present since her treatment. No swelling in the breast area is reported. A recent blood test Guardant reveal returned negative results. She completed a lung cancer screening in June, which did not reveal any significant findings. Her next mammogram is scheduled for April.  She reports significant weight gain, stating that she has maintained a weight between 260 to 280 pounds but is now approaching 300 pounds. She attributes this to increased eating, possibly related to a change in mood following the loss of her sister last year. She acknowledges difficulty in exercising due to knee and back pain.  She takes anastrozole  daily without adverse reactions and also takes vitamin D, which may have contributed to her improved bone density. She has a history of penicillin allergy, which causes hives, but has tolerated doxycycline in the past without issues.    REVIEW OF SYSTEMS:  Review of Systems  Constitutional:  Negative for appetite change, chills, fatigue, fever and unexpected  weight change.  HENT:   Negative for hearing loss, lump/mass and trouble swallowing.   Eyes:  Negative for  eye problems and icterus.  Respiratory:  Negative for chest tightness, cough and shortness of breath.   Cardiovascular:  Negative for chest pain, leg swelling and palpitations.  Gastrointestinal:  Negative for abdominal distention, abdominal pain, constipation, diarrhea, nausea and vomiting.  Endocrine: Negative for hot flashes.  Genitourinary:  Negative for difficulty urinating.   Musculoskeletal:  Negative for arthralgias.  Skin:  Negative for itching and rash.  Neurological:  Negative for dizziness, extremity weakness, headaches and numbness.  Hematological:  Negative for adenopathy. Does not bruise/bleed easily.  Psychiatric/Behavioral:  Negative for depression. The patient is not nervous/anxious.          PAST MEDICAL/SURGICAL HISTORY:  Past Medical History:  Diagnosis Date   Alcohol abuse    pt stopped drinking alcohol around 2009   Anxiety    Arthritis    bil knees and neck   Asthma    excersice   B12 deficiency    Back pain    Breast cancer (HCC) 06/2023   right breast IDC   Claustrophobia    Claustrophobia    Colon polyps    Constipation    COPD (chronic obstructive pulmonary disease) (HCC)    Depression    Diabetes mellitus without complication (HCC)    Difficult intubation 06/11/2016   glidescope grade 2, pt states there was no difficulty with airway during cholecystectomy   Diverticulitis    Family history of adverse reaction to anesthesia    sister has vomiting   Family history of bladder cancer    Family history of breast cancer    Family history of colon cancer    Family history of thyroid  cancer    Former smoker    H/O rosacea    History of radiation therapy    RIght breast-09/19/23-11/03/23- Dr. Lynwood Nasuti   Hyperlipidemia    Hyperparathyroidism    Hypertension    Joint pain    Multiple thyroid  nodules    Multiple thyroid  nodules    benign has checked yearly   Multiple thyroid  nodules    benign per pt   Palpitations    Skin cancer, basal  cell    Sleep apnea    no cpap   SOB (shortness of breath)    Squamous cell skin cancer    Swelling of both lower extremities    Thyroid  disease    Vitamin D deficiency    Past Surgical History:  Procedure Laterality Date   BREAST LUMPECTOMY WITH RADIOACTIVE SEED LOCALIZATION Right 08/02/2023   Procedure: BREAST LUMPECTOMY WITH RADIOACTIVE SEED LOCALIZATION;  Surgeon: Vanderbilt Ned, MD;  Location:  S[facility]ERY [facility];  Service: General;  Laterality: Right;  RIGHT BREAST SEED LUMPECTOMY   CARPAL TUNNEL RELEASE     Bilateral   CESAREAN SECTION     x 2   CHOLECYSTECTOMY     ENDOVENOUS ABLATION SAPHENOUS VEIN W/ LASER Left 11/16/2016   endovenous laser ablation L GSV and stab phlebectomy > 20 incisions left leg by Lynwood Collum MD    PARATHYROIDECTOMY Left 06/11/2016   Procedure: LEFT PARATHYROIDECTOMY;  Surgeon: Krystal Spinner, MD;  Location: [facility];  Service: General;  Laterality: Left;   TONSILLECTOMY AND ADENOIDECTOMY     TUBAL LIGATION     VULVECTOMY N/A 02/16/2023   Procedure: LYNDLE;  Surgeon: Rosalva Sawyer, MD;  Location: [facility];  Service: Gynecology;  Laterality: N/A;  ALLERGIES:  Allergies  Allergen Reactions   Myrbetriq [Mirabegron] Shortness Of Breath and Swelling    Other reaction(s): SOB and swelling in feet   Amoxicillin Hives   Augmentin [Amoxicillin-Pot Clavulanate] Hives   Bupropion     Worsened depression    Clindamycin /Lincomycin Hives   Doxycycline Hives   Duloxetine Other (See Comments)    Other reaction(s): more depressed at high dose   Ibuprofen Itching and Swelling    Lower lip swelling    Oxybutynin Chloride Other (See Comments)    Did not help   Ozempic (0.25 Or 0.5 Mg-Dose) [Semaglutide(0.25 Or 0.5mg -Dos)] Diarrhea   Rosuvastatin     Other reaction(s): leg pain, Other (See Comments)   Trintellix [Vortioxetine] Other (See Comments)    Increased anxiety     CURRENT MEDICATIONS:  Outpatient Encounter Medications as of 02/08/2024   Medication Sig   doxycycline (VIBRA-TABS) 100 MG tablet Take 1 tablet (100 mg total) by mouth 2 (two) times daily.   acetaminophen  (TYLENOL ) 500 MG tablet Take 2 tablets (1,000 mg total) by mouth every 8 (eight) hours as needed for mild pain (pain score 1-3).   acetaminophen  (TYLENOL ) 650 MG CR tablet Take 1,300 mg by mouth every 8 (eight) hours as needed for pain.   ALPRAZolam (XANAX) 0.5 MG tablet Take 0.25-0.5 mg by mouth at bedtime as needed for sleep.    anastrozole  (ARIMIDEX ) 1 MG tablet Take 1 tablet (1 mg total) by mouth daily.   Cholecalciferol (VITAMIN D3) 2000 units capsule Take 2,000 Units by mouth daily.   Cyanocobalamin (B-12 PO) Take 1,000 mcg by mouth daily.   FLUoxetine (PROZAC) 10 MG capsule Take 30 mg by mouth daily.   fluticasone (FLONASE) 50 MCG/ACT nasal spray Place 1 spray into both nostrils daily as needed for allergies or rhinitis.   ketoconazole (NIZORAL) 2 % cream Apply 1 Application topically daily as needed (itching).   metFORMIN (GLUCOPHAGE-XR) 500 MG 24 hr tablet Take 500 mg by mouth at bedtime.   Multiple Vitamin (MULTIVITAMIN WITH MINERALS) TABS tablet Take 1 tablet by mouth daily.   naproxen  sodium (ALEVE ) 220 MG tablet Take 220 mg by mouth.   pravastatin (PRAVACHOL) 10 MG tablet Take 10 mg by mouth 2 (two) times a week.   triamterene-hydrochlorothiazide  (MAXZIDE-25) 37.5-25 MG tablet Take 0.5 tablets by mouth every morning.   No facility-administered encounter medications on file as of 02/08/2024.     ONCOLOGIC FAMILY HISTORY:  Family History  Problem Relation Age of Onset   CVA Mother 107   Thyroid  cancer Mother        dx in her 48s   Colon cancer Mother        dx in her 40s, d. 54   Lung cancer Mother        dx in 67s   Cancer Mother        parotid gland cancer, d. 69   Stroke Mother    Depression Mother    Anxiety disorder Mother    Alcoholism Mother    Cancer Father        Bladder   Depression Father    Alcoholism Father    Cancer  Sister        breast, thyroid  and colon   Hyperparathyroidism Sister    Thyroid  cancer Sister        dx in 81s, 2nd time at 60   Breast cancer Sister 69       second at 76   Diabetes Maternal Grandmother  COPD Maternal Grandfather    Breast cancer Paternal Grandmother        dx in 13s; d. 23   Thyroid  cancer Other        dx late 79s; daughter of sister with thyroid  cancer     SOCIAL HISTORY:  Social History   Socioeconomic History   Marital status: Widowed    Spouse name: Not on file   Number of children: 2   Years of education: Not on file   Highest education level: Not on file  Occupational History   Occupation: retired  Tobacco Use   Smoking status: Former    Current packs/day: 0.00    Average packs/day: 1 pack/day for 38.0 years (38.0 ttl pk-yrs)    Types: Cigarettes    Start date: 03/01/1970    Quit date: 03/01/2008    Years since quitting: 15.9   Smokeless tobacco: Never  Vaping Use   Vaping status: Never Used  Substance and Sexual Activity   Alcohol use: No    Comment: pt quit drinking around 2009   Drug use: No   Sexual activity: Not Currently    Birth control/protection: Post-menopausal, Surgical    Comment: BTL  Other Topics Concern   Not on file  Social History Narrative   Used to drink alcohol.  None in the last 10 years.   Son lives with her.    She has 3 grands.  She helps take care of them.    Social Drivers of Health   Financial Resource Strain: Medium Risk (01/30/2024)   Overall Financial Resource Strain (CARDIA)    Difficulty of Paying Living Expenses: Somewhat hard  Food Insecurity: No Food Insecurity (01/30/2024)   Hunger Vital Sign    Worried About Running Out of Food in the Last Year: Never true    Ran Out of Food in the Last Year: Never true  Recent Concern: Food Insecurity - Food Insecurity Present (01/30/2024)   Hunger Vital Sign    Worried About Running Out of Food in the Last Year: Sometimes true    Ran Out of Food in the Last Year:  Sometimes true  Transportation Needs: No Transportation Needs (01/30/2024)   PRAPARE - Administrator, Civil Service (Medical): No    Lack of Transportation (Non-Medical): No  Physical Activity: Not on file  Stress: Stress Concern Present (01/30/2024)   Tammy Mccoy    Feeling of Stress: Very much  Social Connections: Socially Isolated (01/30/2024)   Social Connection and Isolation Panel    Frequency of Communication with Friends and Family: Three times a week    Frequency of Social Gatherings with Friends and Family: Three times a week    Attends Religious Services: Never    Active Member of Clubs or Organizations: No    Attends Banker Meetings: Never    Marital Status: Widowed  Intimate Partner Violence: Not At Risk (01/30/2024)   Humiliation, Afraid, Rape, and Kick Mccoy    Fear of Current or Ex-Partner: No    Emotionally Abused: No    Physically Abused: No    Sexually Abused: No     OBSERVATIONS/OBJECTIVE:  BP (!) 151/67 (BP Location: Left Arm, Patient Position: Sitting)   Pulse 88   Temp 97.8 F (36.6 C) (Temporal)   Resp 17   Wt 298 lb 11.2 oz (135.5 kg)   SpO2 95%   BMI 49.71 kg/m   GENERAL: Patient is a well  appearing female in no acute distress HEENT:  Sclerae anicteric.  Oropharynx clear and moist. No ulcerations or evidence of oropharyngeal candidiasis. Neck is supple.  NODES:  No cervical, supraclavicular, or axillary lymphadenopathy palpated.  BREAST EXAM: Right breast status postlumpectomy and radiation no sign of local recurrence, faint erythema noted on right breast, no swelling, warmth, or tenderness to palpation, left breast is benign. LUNGS:  Clear to auscultation bilaterally.  No wheezes or rhonchi. HEART:  Regular rate and rhythm. No murmur appreciated. ABDOMEN:  Soft, nontender.  Positive, normoactive bowel sounds. No organomegaly palpated. MSK:  No focal  spinal tenderness to palpation. Full range of motion bilaterally in the upper extremities. EXTREMITIES:  No peripheral edema.   SKIN:  Clear with no obvious rashes or skin changes. No nail dyscrasia. NEURO:  Nonfocal. Well oriented.  Appropriate affect.   LABORATORY DATA:  None for this visit.  DIAGNOSTIC IMAGING:  None for this visit.      ASSESSMENT AND PLAN:  Ms.. Rallo is a pleasant 73 y.o. female with Stage 1A right breast invasive ductal carcinoma, ER+/PR+/HER2-, diagnosed in March 2025, treated with lumpectomy, adjuvant radiation therapy, and anti-estrogen therapy with anastrozole  beginning in September 2025.    Assessment and Plan Assessment & Plan Right breast skin infection ? Tenderness and redness in the right breast, likely due to cellulitis. Empirical treatment initiated due to previous breast cancer treatment. - Prescribed doxycycline 100 mg twice a day for one week. - Although doxycycline has been listed as an allergy, pt reassured me that she recently took it and tolerated it just fine. - Instructed to monitor for improvement in redness. - Advised to stop doxycycline and call if hives develop.  History of breast cancer, right, in remission Right breast cancer in remission. Recent blood test negative for recurrence. Right breast pinker than left, consistent with post-treatment changes vs cellulitis - Continue annual lung cancer screening. - Ensure mammogram is scheduled for April.  Obesity Weight gain noted, current weight approaching 300 lbs. Difficulty exercising due to knee and back pain. Discussed dietary modifications to manage weight. - Encouraged dietary modifications focusing on increased protein intake. - Advised on adequate hydration to manage hunger signals.  The patient was provided an opportunity to ask questions and all were answered. The patient agreed with the plan and demonstrated an understanding of the instructions.   Total encounter time:30  minutes*in face-to-face visit time, chart review, lab review, care coordination, order entry, and documentation of the encounter time.  *Total Encounter Time as defined by the Centers for Medicare and Medicaid Services includes, in addition to the face-to-face time of a patient visit (documented in the note above) non-face-to-face time: obtaining and reviewing outside history, ordering and reviewing medications, tests or procedures, care coordination (communications with other health care professionals or caregivers) and documentation in the medical record.

## 2024-02-13 LAB — GUARDANT REVEAL

## 2024-02-24 ENCOUNTER — Other Ambulatory Visit: Payer: Self-pay | Admitting: Hematology and Oncology

## 2024-03-07 NOTE — Progress Notes (Signed)
 Tammy Mccoy                                          MRN: [id number]   03/07/2024   The [facility] Quality Team Specialist reviewed this patient medical record for the purposes of chart review for care gap closure. The following were reviewed: chart review for care gap closure-kidney health evaluation for diabetes:eGFR  and uACR.    [facility] Quality Team

## 2024-04-04 ENCOUNTER — Other Ambulatory Visit: Payer: Self-pay | Admitting: Family Medicine

## 2024-04-04 ENCOUNTER — Ambulatory Visit

## 2024-04-04 DIAGNOSIS — M7989 Other specified soft tissue disorders: Secondary | ICD-10-CM

## 2024-07-31 ENCOUNTER — Inpatient Hospital Stay

## 2024-07-31 ENCOUNTER — Inpatient Hospital Stay: Admitting: Hematology and Oncology
